# Patient Record
Sex: Female | Born: 1935 | Race: White | Hispanic: No | Smoking: Never smoker
Health system: Southern US, Community
[De-identification: ages and names within clinical notes are randomized; demographics above are authoritative.]

## PROBLEM LIST (undated history)

## (undated) DIAGNOSIS — F329 Major depressive disorder, single episode, unspecified: Secondary | ICD-10-CM

## (undated) DIAGNOSIS — E039 Hypothyroidism, unspecified: Secondary | ICD-10-CM

## (undated) DIAGNOSIS — F99 Mental disorder, not otherwise specified: Secondary | ICD-10-CM

## (undated) DIAGNOSIS — F32A Depression, unspecified: Secondary | ICD-10-CM

## (undated) DIAGNOSIS — F419 Anxiety disorder, unspecified: Secondary | ICD-10-CM

## (undated) DIAGNOSIS — T4145XA Adverse effect of unspecified anesthetic, initial encounter: Secondary | ICD-10-CM

## (undated) DIAGNOSIS — I639 Cerebral infarction, unspecified: Secondary | ICD-10-CM

## (undated) DIAGNOSIS — I10 Essential (primary) hypertension: Secondary | ICD-10-CM

## (undated) DIAGNOSIS — R011 Cardiac murmur, unspecified: Secondary | ICD-10-CM

## (undated) DIAGNOSIS — J069 Acute upper respiratory infection, unspecified: Secondary | ICD-10-CM

## (undated) DIAGNOSIS — T8859XA Other complications of anesthesia, initial encounter: Secondary | ICD-10-CM

## (undated) DIAGNOSIS — M1990 Unspecified osteoarthritis, unspecified site: Secondary | ICD-10-CM

## (undated) HISTORY — PX: OTHER SURGICAL HISTORY: SHX169

## (undated) HISTORY — PX: ABDOMINAL HYSTERECTOMY: SHX81

## (undated) HISTORY — PX: APPENDECTOMY: SHX54

## (undated) HISTORY — PX: BACK SURGERY: SHX140

---

## 1998-08-27 ENCOUNTER — Encounter: Payer: Self-pay | Admitting: Emergency Medicine

## 1998-08-27 ENCOUNTER — Emergency Department (HOSPITAL_COMMUNITY): Admission: EM | Admit: 1998-08-27 | Discharge: 1998-08-27 | Payer: Self-pay | Admitting: *Deleted

## 2000-12-18 ENCOUNTER — Encounter: Payer: Self-pay | Admitting: Interventional Cardiology

## 2000-12-18 ENCOUNTER — Ambulatory Visit (HOSPITAL_COMMUNITY): Admission: RE | Admit: 2000-12-18 | Discharge: 2000-12-18 | Payer: Self-pay | Admitting: Interventional Cardiology

## 2005-05-30 ENCOUNTER — Ambulatory Visit: Payer: Self-pay | Admitting: Internal Medicine

## 2007-06-19 ENCOUNTER — Ambulatory Visit (HOSPITAL_COMMUNITY): Admission: RE | Admit: 2007-06-19 | Discharge: 2007-06-19 | Payer: Self-pay | Admitting: Internal Medicine

## 2007-11-06 ENCOUNTER — Emergency Department (HOSPITAL_COMMUNITY): Admission: EM | Admit: 2007-11-06 | Discharge: 2007-11-06 | Payer: Self-pay | Admitting: Emergency Medicine

## 2008-04-16 ENCOUNTER — Ambulatory Visit (HOSPITAL_COMMUNITY): Admission: RE | Admit: 2008-04-16 | Discharge: 2008-04-16 | Payer: Self-pay | Admitting: Internal Medicine

## 2009-07-10 IMAGING — CR DG RIBS 2V*L*
4 series · 4 of 4 positions shown · non-contrast
Comparison: Yesterday?s chest x-ray.

CLINICAL DATA: Left chest wall pain 
 LEFT UNILATERAL RIBS:

[view not recorded (1 of 4)]
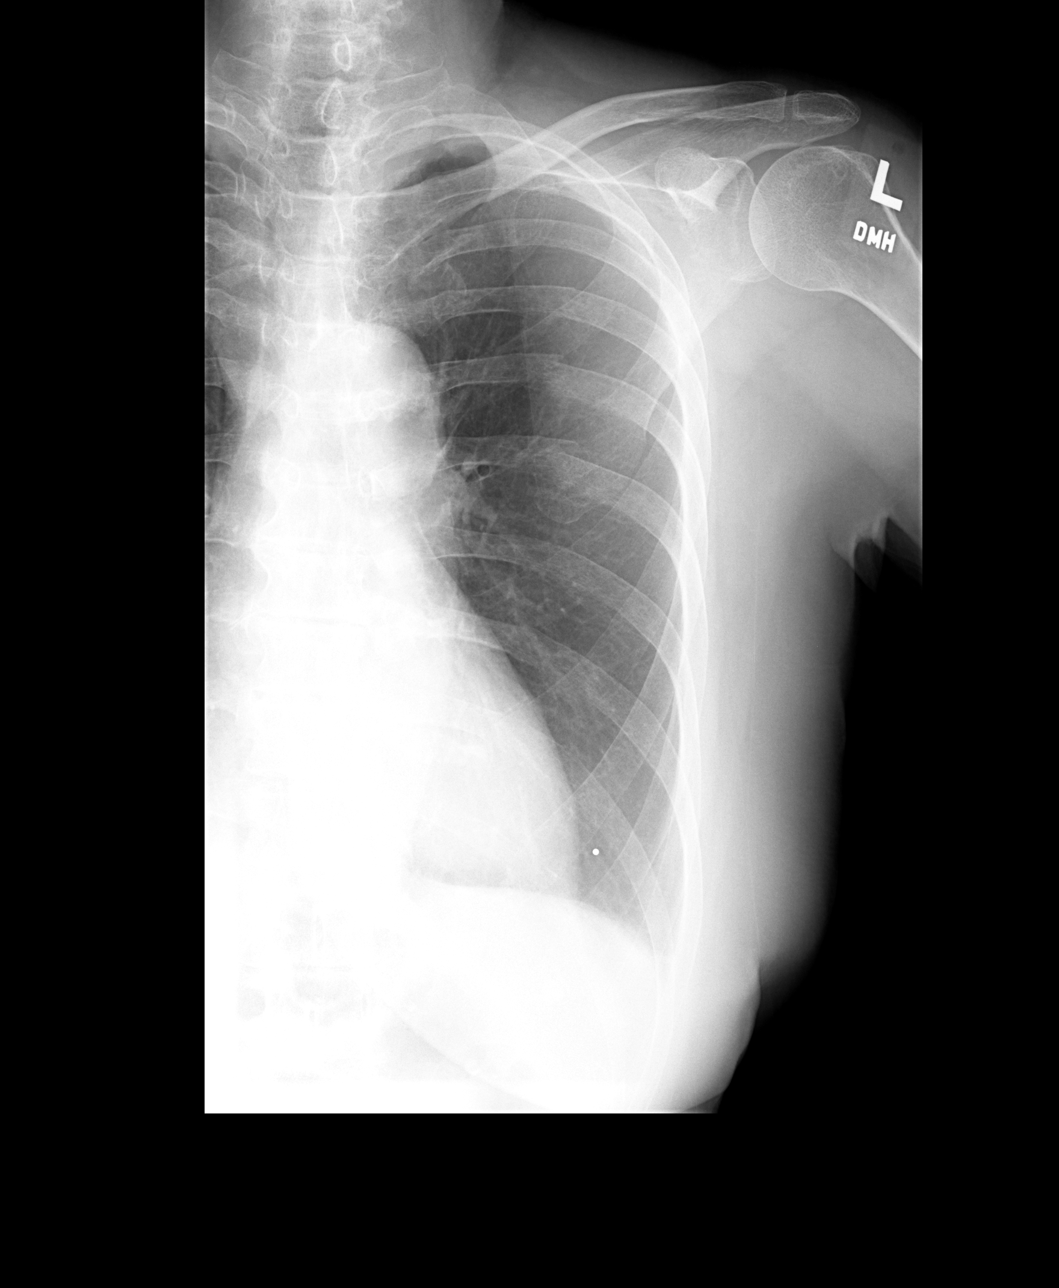

[view not recorded (2 of 4)]
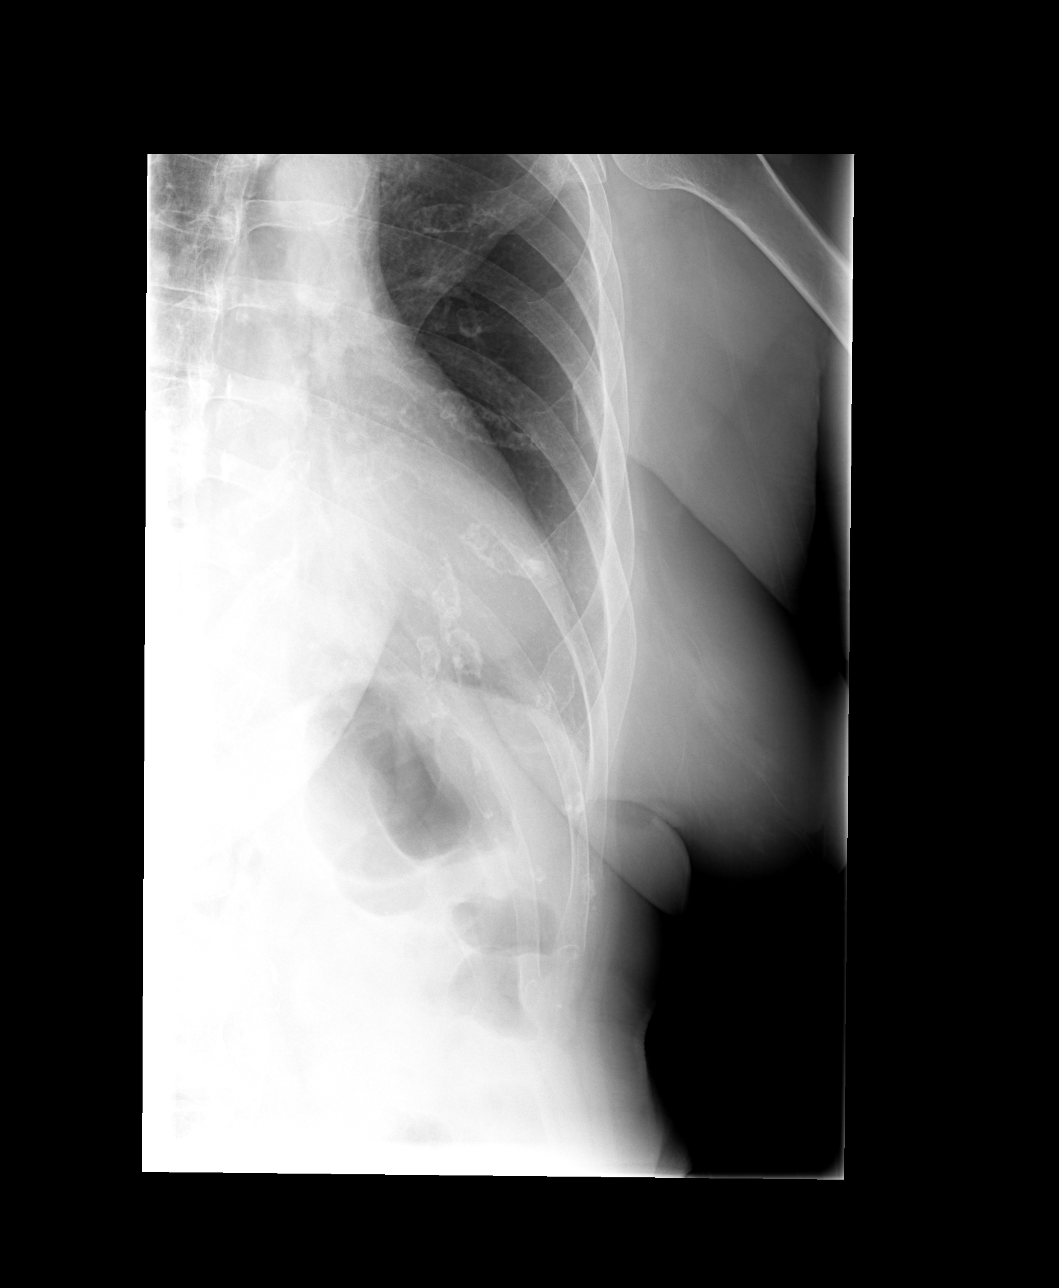

[view not recorded (3 of 4)]
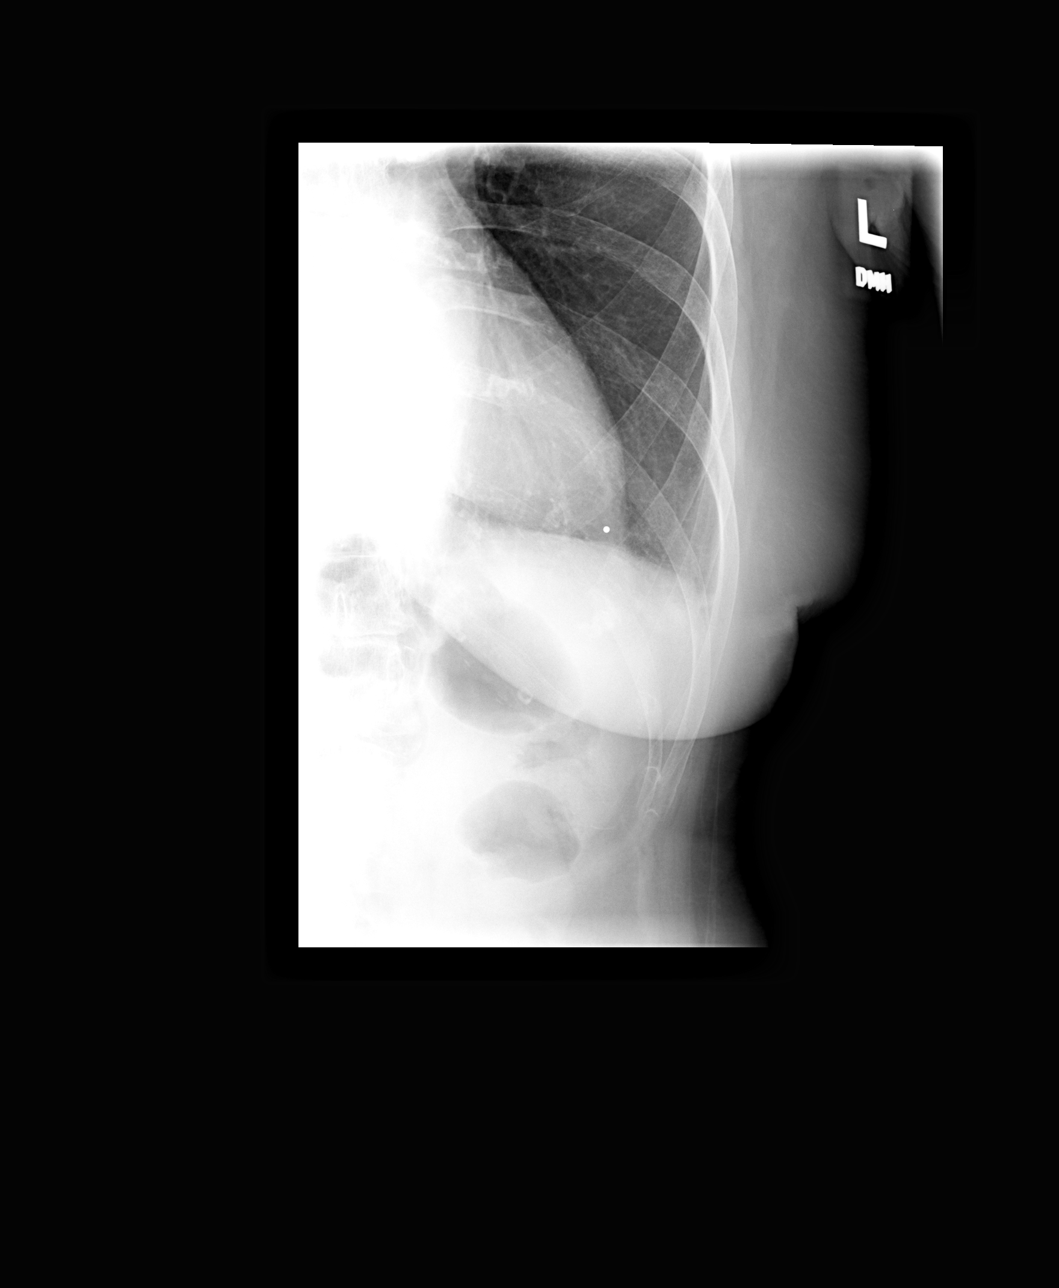

[view not recorded (4 of 4)]
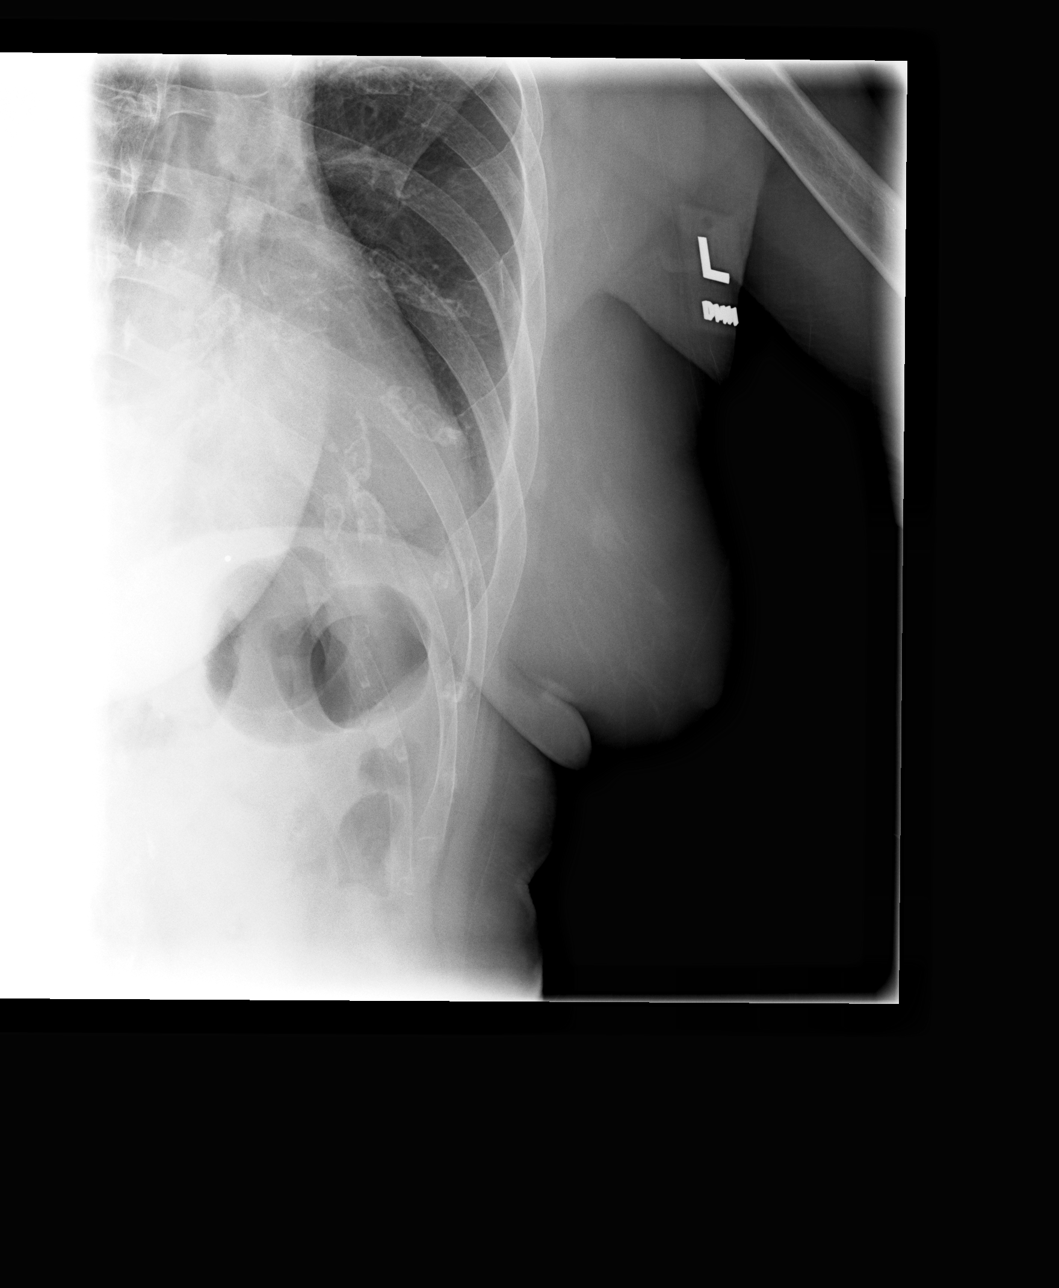

[4 of 4 positions shown; findings below may reference images not displayed]

FINDINGS: A BB was placed at the site of symptom along the posterior low left rib cage. 
 There are no fractures in this area, but there are fractures of the left 5th, 5th and 7th ribs that look either acute or subacute.  No pneumothorax or hemothorax.
IMPRESSION: 1.  No acute rib fractures in the area of symptoms, but there are acute or subacute fractures of the left 5th though7th ribs.  
 2.  No pneumothorax or hemothorax.

## 2011-07-19 LAB — DIFFERENTIAL
Basophils Absolute: 0.1
Eosinophils Absolute: 0.2
Eosinophils Relative: 2
Lymphocytes Relative: 19
Neutrophils Relative %: 67

## 2011-07-19 LAB — COMPREHENSIVE METABOLIC PANEL
ALT: 17
AST: 21
CO2: 27
Calcium: 9.3
Chloride: 96
Creatinine, Ser: 0.87
GFR calc Af Amer: >60
GFR calc non Af Amer: >60
Glucose, Bld: 104 — ABNORMAL HIGH
Total Bilirubin: 1

## 2011-07-19 LAB — CBC
HCT: 41.4
Hemoglobin: 14.3
MCHC: 34.6
MCV: 91.1
Platelets: 204
RBC: 4.55
RDW: 13.1
WBC: 10.1

## 2011-07-19 LAB — LIPASE, BLOOD: Lipase: 39

## 2011-10-11 ENCOUNTER — Inpatient Hospital Stay (HOSPITAL_COMMUNITY)
Admission: RE | Admit: 2011-10-11 | Discharge: 2011-10-15 | DRG: 066 | Disposition: A | Discharge status: Skilled Nursing Facility | Payer: Medicare Other | Source: Ambulatory Visit | Attending: Internal Medicine | Admitting: Internal Medicine

## 2011-10-11 ENCOUNTER — Encounter (HOSPITAL_COMMUNITY): Payer: Self-pay | Admitting: Internal Medicine

## 2011-10-11 ENCOUNTER — Other Ambulatory Visit: Payer: Self-pay | Admitting: Internal Medicine

## 2011-10-11 DIAGNOSIS — E039 Hypothyroidism, unspecified: Secondary | ICD-10-CM

## 2011-10-11 DIAGNOSIS — M1990 Unspecified osteoarthritis, unspecified site: Secondary | ICD-10-CM | POA: Insufficient documentation

## 2011-10-11 DIAGNOSIS — E785 Hyperlipidemia, unspecified: Secondary | ICD-10-CM

## 2011-10-11 DIAGNOSIS — F32A Depression, unspecified: Secondary | ICD-10-CM | POA: Insufficient documentation

## 2011-10-11 DIAGNOSIS — R2681 Unsteadiness on feet: Secondary | ICD-10-CM

## 2011-10-11 DIAGNOSIS — G309 Alzheimer's disease, unspecified: Secondary | ICD-10-CM | POA: Diagnosis present

## 2011-10-11 DIAGNOSIS — I639 Cerebral infarction, unspecified: Secondary | ICD-10-CM

## 2011-10-11 DIAGNOSIS — I6350 Cerebral infarction due to unspecified occlusion or stenosis of unspecified cerebral artery: Principal | ICD-10-CM | POA: Diagnosis present

## 2011-10-11 DIAGNOSIS — R002 Palpitations: Secondary | ICD-10-CM | POA: Diagnosis present

## 2011-10-11 DIAGNOSIS — F0280 Dementia in other diseases classified elsewhere without behavioral disturbance: Secondary | ICD-10-CM

## 2011-10-11 DIAGNOSIS — F329 Major depressive disorder, single episode, unspecified: Secondary | ICD-10-CM

## 2011-10-11 HISTORY — DX: Mental disorder, not otherwise specified: F99

## 2011-10-11 HISTORY — DX: Anxiety disorder, unspecified: F41.9

## 2011-10-11 HISTORY — DX: Hypothyroidism, unspecified: E03.9

## 2011-10-11 HISTORY — DX: Unspecified osteoarthritis, unspecified site: M19.90

## 2011-10-11 HISTORY — DX: Major depressive disorder, single episode, unspecified: F32.9

## 2011-10-11 HISTORY — DX: Adverse effect of unspecified anesthetic, initial encounter: T41.45XA

## 2011-10-11 HISTORY — DX: Acute upper respiratory infection, unspecified: J06.9

## 2011-10-11 HISTORY — DX: Cardiac murmur, unspecified: R01.1

## 2011-10-11 HISTORY — DX: Cerebral infarction, unspecified: I63.9

## 2011-10-11 HISTORY — DX: Depression, unspecified: F32.A

## 2011-10-11 HISTORY — DX: Other complications of anesthesia, initial encounter: T88.59XA

## 2011-10-11 HISTORY — DX: Essential (primary) hypertension: I10

## 2011-10-11 LAB — CBC
Hemoglobin: 14.2 g/dL (ref 12.0–15.0)
MCH: 31.4 pg (ref 26.0–34.0)
MCHC: 34.2 g/dL (ref 30.0–36.0)
MCV: 91.8 fL (ref 78.0–100.0)
RBC: 4.52 MIL/uL (ref 3.87–5.11)

## 2011-10-11 LAB — COMPREHENSIVE METABOLIC PANEL
CO2: 27 mEq/L (ref 19–32)
Calcium: 9.2 mg/dL (ref 8.4–10.5)
Creatinine, Ser: 1.12 mg/dL — ABNORMAL HIGH (ref 0.50–1.10)
GFR calc Af Amer: 53 mL/min — ABNORMAL LOW (ref >90)
GFR calc non Af Amer: 45 mL/min — ABNORMAL LOW (ref >90)
Glucose, Bld: 81 mg/dL (ref 70–99)
Total Protein: 7.2 g/dL (ref 6.0–8.3)

## 2011-10-11 LAB — LIPID PANEL
Cholesterol: 170 mg/dL (ref 0–200)
HDL: 43 mg/dL (ref >39)
Triglycerides: 118 mg/dL (ref <150)

## 2011-10-11 MED ORDER — METOPROLOL TARTRATE 25 MG PO TABS
25.0000 mg | ORAL_TABLET | Freq: Two times a day (BID) | ORAL | Status: DC
  Administered 2011-10-11 – 2011-10-15 (×5): 25 mg via ORAL
  Filled 2011-10-11 (×9): qty 1

## 2011-10-11 MED ORDER — MEMANTINE HCL 10 MG PO TABS: 10.0000 mg | ORAL_TABLET | Freq: Two times a day (BID) | ORAL | Status: DC

## 2011-10-11 MED ORDER — METOPROLOL TARTRATE 25 MG PO TABS: 25.0000 mg | ORAL_TABLET | Freq: Two times a day (BID) | ORAL | Status: DC

## 2011-10-11 MED ORDER — PRAVASTATIN SODIUM 20 MG PO TABS: 20.0000 mg | ORAL_TABLET | Freq: Every evening | ORAL | Status: DC

## 2011-10-11 MED ORDER — SODIUM CHLORIDE 0.9 % IV SOLN
INTRAVENOUS | Status: DC
  Administered 2011-10-11: 21:00:00 via INTRAVENOUS

## 2011-10-11 MED ORDER — ASPIRIN 325 MG PO TABS: 325.0000 mg | ORAL_TABLET | Freq: Every day | ORAL | Status: AC

## 2011-10-11 MED ORDER — DONEPEZIL HCL 10 MG PO TABS
10.0000 mg | ORAL_TABLET | Freq: Every day | ORAL | Status: DC
  Administered 2011-10-11 – 2011-10-14 (×4): 10 mg via ORAL
  Filled 2011-10-11 (×5): qty 1

## 2011-10-11 MED ORDER — LEVOTHYROXINE SODIUM 75 MCG PO TABS
75.0000 ug | ORAL_TABLET | Freq: Every day | ORAL | Status: DC
  Administered 2011-10-11 – 2011-10-15 (×5): 75 ug via ORAL
  Filled 2011-10-11 (×5): qty 1

## 2011-10-11 MED ORDER — CITALOPRAM HYDROBROMIDE 20 MG PO TABS
20.0000 mg | ORAL_TABLET | Freq: Every day | ORAL | Status: DC
  Administered 2011-10-11 – 2011-10-15 (×5): 20 mg via ORAL
  Filled 2011-10-11 (×5): qty 1

## 2011-10-11 MED ORDER — ENOXAPARIN SODIUM 40 MG/0.4ML ~~LOC~~ SOLN
40.0000 mg | SUBCUTANEOUS | Status: DC
  Administered 2011-10-11 – 2011-10-14 (×4): 40 mg via SUBCUTANEOUS
  Filled 2011-10-11 (×5): qty 0.4

## 2011-10-11 MED ORDER — LORAZEPAM 0.5 MG PO TABS: 0.5000 mg | ORAL_TABLET | Freq: Three times a day (TID) | ORAL | Status: DC | PRN

## 2011-10-11 MED ORDER — MEMANTINE HCL 10 MG PO TABS
10.0000 mg | ORAL_TABLET | Freq: Two times a day (BID) | ORAL | Status: DC
  Administered 2011-10-11 – 2011-10-15 (×8): 10 mg via ORAL
  Filled 2011-10-11 (×9): qty 1

## 2011-10-11 MED ORDER — ASPIRIN 325 MG PO TABS
325.0000 mg | ORAL_TABLET | Freq: Every day | ORAL | Status: DC
  Administered 2011-10-11 – 2011-10-15 (×5): 325 mg via ORAL
  Filled 2011-10-11 (×4): qty 1

## 2011-10-11 MED ORDER — ACETAMINOPHEN 325 MG PO TABS: 650.0000 mg | ORAL_TABLET | ORAL | Status: DC | PRN

## 2011-10-11 MED ORDER — LEVOTHYROXINE SODIUM 75 MCG PO TABS: 75.0000 ug | ORAL_TABLET | Freq: Every day | ORAL | Status: AC

## 2011-10-11 MED ORDER — DONEPEZIL HCL 10 MG PO TABS: 10.0000 mg | ORAL_TABLET | Freq: Every day | ORAL | Status: DC

## 2011-10-11 MED ORDER — CITALOPRAM HYDROBROMIDE 20 MG PO TABS: 20.0000 mg | ORAL_TABLET | Freq: Every day | ORAL | Status: DC

## 2011-10-11 NOTE — H&P (Addendum)
Heather Chan is an 77 y.o. female.   Chief Complaint: frequent falls HPI:  The patient is a 77 year old Caucasian woman with several medical problems including moderately advanced Alzheimer's disease, hypothyroidism, depression, and hyperlipidemia who presented to our office today for evaluation of worsening weakness and gait instability.  Yesterday she was found on her bathroom floor by a caretaker.  She did not have apparent injury.  She is not a good history because of her Alzheimer's disease and history is obtained via discussion with her daughter who is present during the exam.  For the past 2-3 days prior to hospitalization she's had progressive weakness with shaking episodes and unsteady gait.  During daytime hours she stays  at her daughter's house and then in the evening she returns to her home where a caretaker stays with her.  Yesterday her daughter found her in the bathroom without apparent injury.  She was somewhat lethargic and 911 was called.  For some reason they declined transportation to the emergency room and opted for an evaluation in our office today.  Her review of system is significant for a fever yesterday but none today with a dry cough.  She denied symptoms of change in vision, nausea, vomiting, headache, shortness of breath, or significant chest pain or palpitations.  She has no history of arrhythmia or cardiac disease or stroke. Following our office evaluation she had an acute MRI scan of the brain done [facility] with the studies showing a microinfarction of the left occipital lobe that was acute with global atrophy and small vessel ischemic disease.  There was no evidence of hemorrhage.  She is being admitted for further evaluation.  Past Medical History  Diagnosis Date  . Mental disorder   . DEMENTIA   . Hypothyroidism   . Arthritis   . Anxiety   . Depression     No current facility-administered medications on file as of 10/11/2011.   No current  outpatient prescriptions on file as of 10/11/2011.    ADDITIONAL HOME MEDICATIONS: lorazepam 0.5 mg by mouth daily when necessary anxiety, Aricept 10 mg by mouth every p.m., Namenda 10 mg by mouth twice daily, Celexa 20 mg take 2 tabs by mouth once daily, Synthroid 75 Chan by mouth daily, pravastatin 20 mg by mouth daily  PHYSICIANS INVOLVED IN CARE: Heather Chan (primary care)  Past Surgical History  Procedure Date  . Appendectomy   . Abdominal hysterectomy   . Back surgery     History reviewed. No pertinent family history.   Social History:  reports that she has never smoked. She does not have any smokeless tobacco history on file. She reports that she does not drink alcohol or use illicit drugs.  Allergies:  Allergies  Allergen Reactions  . Pneumovax (Pneumococcal Polysaccharides)   . Penicillins    Note that last influenza vaccination was July 19, 2011 and she is unable to take the Pneumovax vaccination because of an allergy  ROS: arthritis, thyroid disease and anxiety, atypical chest discomfort, occasional palpitations, depression, short-term memory deficits, osteoarthritis  PHYSICAL EXAM: There were no vitals taken for this visit. Office vital signs include blood pressure 160/80, pulse 72, respirations 16, temperature 98.6, body weight 174 pounds at a height of 63.75 inches.  Pulse oxygen saturation was 96% on room air.  In general, she's a pro-elderly white woman who was in no apparent distress while sitting in her car seat (she declined office reevaluation because of fatigue).  She was a poor  historian because of Alzheimer's disease.  HEENT exam was within normal limits, neck was supple without jugular venous distention or carotid bruit, chest was clear to auscultation, heart had a rapid irregular rhythm without significant murmur, abdomen had normal bowel sounds and no tenderness, extremities have bilateral trace ankle edema, neurologic exam: She is alert and oriented  x1, cranial nerves II through XII were normal with the exception of decreased hearing bilaterally, sensory exam was grossly normal, motor strength is 5 out of 5 throughout, she had bilateral slow finger to nose testing, her gait was wide-based during earlier evaluation with our nurse practitioner.  No results found for this or any previous visit (from the past 48 hour(s)). No results found.  Today MRI scan of the brain and tried imaging showed the following: No acute hemorrhagic findings, acute microinfarction of the left occipital lobe with global atrophy and small vessel ischemic changes.  Today chest x-ray done and tried imaging showed the following: Mild left lower lobe atelectatic change, calcific atherosclerotic vascular changes, hiatal hernia, multiple old left rib fractures   Assessment/Plan #1 Acute Stroke: On MRI imaging she has an acute stroke and by physical exam and history she has new gait instability.  We will request a physical therapist and occupational therapist evaluation.  We will also admit her to a telemetry bed to see if she is having continuous or intermittent atrial fibrillation that certainly could cause an acute stroke.  For now we have placed her on aspirin once daily.  We will also arrange for her to have a carotid ultrasound exam and an echocardiogram.  Earlier today she ate without difficulty a regular diet and I doubt that she has significant swallowing problems. #2 Palpitations: Clinically appears that she may have atrial fibrillation and we will evaluate this with a 12-lead EKG and telemetry. #3 Alzheimer's disease: Moderately advanced and stable on current medications. #4 Depression and Anxiety: Stable on current medications. #5 Hypothyroidism: Clinically she is euthyroid and we will check a TSH level. #6 Hyperlipidemia: She had been on pravastatin which is not covered by hospital formulary and we will make an appropriate substitution.  Heather Rauh  Chan 10/11/2011, 6:29 PM

## 2011-10-12 ENCOUNTER — Other Ambulatory Visit: Payer: Self-pay

## 2011-10-12 ENCOUNTER — Inpatient Hospital Stay (HOSPITAL_COMMUNITY): Payer: Medicare Other

## 2011-10-12 DIAGNOSIS — I359 Nonrheumatic aortic valve disorder, unspecified: Secondary | ICD-10-CM

## 2011-10-12 LAB — LIPID PANEL
Cholesterol: 162 mg/dL (ref 0–200)
HDL: 38 mg/dL — ABNORMAL LOW (ref >39)
Total CHOL/HDL Ratio: 4.3 RATIO
Triglycerides: 100 mg/dL (ref <150)
VLDL: 20 mg/dL (ref 0–40)

## 2011-10-12 LAB — GLUCOSE, CAPILLARY
Glucose-Capillary: 107 mg/dL — ABNORMAL HIGH (ref 70–99)
Glucose-Capillary: 124 mg/dL — ABNORMAL HIGH (ref 70–99)
Glucose-Capillary: 101 mg/dL — ABNORMAL HIGH (ref 70–99)

## 2011-10-12 LAB — TSH: TSH: 3.076 u[IU]/mL (ref 0.350–4.500)

## 2011-10-12 NOTE — Plan of Care (Signed)
Problem: Phase II Progression Outcomes Goal: Tolerating diet Completed Bedside Swallow Evaluation Scheduled MBS for today at 1300 Pt to remain NPO until completion of MBS

## 2011-10-12 NOTE — Procedures (Signed)
Modified Barium Swallow Procedure Note Patient Details  Name: Carnella Fryman MRN: [id number] Date of Birth: Jan 03, 1930  Today's Date: 10/12/2011 Time:  -     Past Medical History:  Past Medical History  Diagnosis Date  . Mental disorder   . DEMENTIA   . Hypothyroidism   . Arthritis   . Anxiety   . Depression   . Complication of anesthesia     hypotension post surgery  . Heart murmur   . Hypertension   . Stroke   . Recurrent upper respiratory infection (URI)    Past Surgical History:  Past Surgical History  Procedure Date  . Appendectomy   . Abdominal hysterectomy   . Back surgery   . Thyroidectomy     Recommendation/Prognosis  Clinical Impression Dysphagia Diagnosis: Suspected primary esophageal dysphagia Clinical impression: Oral and pharyngeal phase of swallow was WFL's.  Esophageal scan revealed retrograde movement of purees toward upper esophagus as well as slow transit and hesitancy at lower esophagus.  Pt. coughed once throughout study most likely from esophageal residue.  Aspiration risk is low with regular diet and thin liquids.  Would pt. benefit from a PPI?  Will further discuss esophageal precautions with pt.   Recommendations Solid Consistency: Regular Liquid Consistency: Thin Liquid Administration via: Cup;Straw Medication Administration: Whole meds with liquid Supervision: Patient able to self feed Compensations: Slow rate;Small sips/bites;Follow solids with liquid Postural Changes and/or Swallow Maneuvers: Seated upright 90 degrees (STAY SITTING UPRIGHT 45 MIN-1 HOUR AFTER MEALS) Oral Care Recommendations: Oral care QID Prognosis Prognosis for Safe Diet Advancement: Good Barriers to Reach Goals: Cognitive deficits Individuals Consulted Consulted and Agree with Results and Recommendations: Patient;Family member/caregiver Family Member Consulted: DAUGHTER  SLP Assessment/Plan   SLP Goals   1. Pt. Will consume regular texture with thin liquids  utilizing esophageal precautions with max verbal cues.  General:  Other Pertinent Information: Pt. admitted with seft CVA.  Bedside swallow recommended MBS to further assess oropharygneal swallow function..  PMH; dementia Type of Study: Initial MBS Diet Prior to this Study: Regular;Thin liquids Respiratory Status: Room air Behavior/Cognition: Alert;Confused Oral Cavity - Dentition: Adequate natural dentition Vision: Functional for self-feeding Patient Positioning: Upright in chair Baseline Vocal Quality: Normal Volitional Cough: Strong;Congested Volitional Swallow: Able to elicit Anatomy: Within functional limits Pharyngeal Secretions: Not observed secondary MBS Ice chips: Not tested  Oral Phase Oral Preparation/Oral Phase Oral Phase: WFL Pharyngeal Phase  Pharyngeal Phase Pharyngeal Phase: Within functional limits Cervical Esophageal Phase  Cervical Esophageal Phase Cervical Esophageal Phase: Impaired Cervical Esophageal Phase - Pudding Pudding Teaspoon: Esophageal backflow into cervical esophagus Cervical Esophageal Phase - Thin Thin Cup: Esophageal backflow into cervical esophagus Cervical Esophageal Phase - Comment Cervical Esophageal Comment: Retrograde movement of puree toward cervical esophagus.  Hesitancy in lower esophagus, thin liquids assist in transit to stomach   Royce Macadamia M.Ed [employer] [phone]  10/12/2011

## 2011-10-12 NOTE — Progress Notes (Signed)
Received or for [facility], await PT and OT eval for recommendations, spoke with pt who gave permission to speak with daughter, call placed to daughter re d/c planning and number left for daughter.  Will continue to follow and assist with d/c planning.  Johny Shock RN MPH Case Manager [phone]

## 2011-10-12 NOTE — Progress Notes (Signed)
Subjective: Patient is wide awake but obviously confused. She recognizes me but does not know my name. She is conversant and can follow commands. She certainly close to her baseline confusion but obviously bit worse in the hospital particularly given circumstances.  Objective: Vital signs in last 24 hours: Temp:  [98.6 F (37 C)-99.6 F (37.6 C)] 99.6 F (37.6 C) (12/14 0545) Pulse Rate:  [68-78] 68  (12/14 0545) Resp:  [19-20] 20  (12/14 0545) BP: (134-154)/(68-94) 154/76 mmHg (12/14 0545) SpO2:  [92 %-96 %] 95 % (12/14 0545) Weight:  [78.835 kg (173 lb 12.8 oz)] 173 lb 12.8 oz (78.835 kg) (12/13 2000) Weight change:   CBG (last 3)   Basename 10/12/11 0651 10/11/11 2158  GLUCAP 124* 107*    Intake/Output from previous day:    Physical Exam: Patient is awake and alert conversing speech is fluent. She is good facial symmetry. Lungs are clear to auscultation percussion. Cardiovascular exam is regular rate and rhythm normal S1-S2 no obvious murmur. Telemetry reveals normal sinus rhythm. Neck is supple no JVD or bruits. Abdomen is soft and nontender normal bowel sounds no hepatosplenomegaly. Extremities intact pulses trace edema calves soft and nontender joints appear normal. Neurologically she is fluent in speech confused regarding circumstances but at baseline mental status. Cranial nerves are intact. Motor is 5 out of 5. Sensation is grossly intact. It was not assessed.   Lab Results:  [facility] 10/11/11 1957  NA 134*  K 3.7  CL 96  CO2 27  GLUCOSE 81  BUN 16  CREATININE 1.12*  CALCIUM 9.2  MG --  PHOS --    Basename 10/11/11 1957  AST 17  ALT 13  ALKPHOS 77  BILITOT 0.8  PROT 7.2  ALBUMIN 3.3*    Basename 10/11/11 1957  WBC 13.4*  NEUTROABS --  HGB 14.2  HCT 41.5  MCV 91.8  PLT 193   No results found for this basename: INR, PROTIME   No results found for this basename: CKTOTAL:3,CKMB:3,CKMBINDEX:3,TROPONINI:3 in the last 72 hours  Basename 10/11/11 1957   TSH 3.076  T4TOTAL --  T3FREE --  THYROIDAB --   No results found for this basename: VITAMINB12:2,FOLATE:2,FERRITIN:2,TIBC:2,IRON:2,RETICCTPCT:2 in the last 72 hours  Studies/Results: No results found.   Assessment/Plan: #1 acute left occipital lobe infarct small with no obvious deficit at this point beyond his stork fall related above  #2 Alzheimer's dementia moderate to severe on multiple medications stable  #3 chronic depression and anxiety stable  #4 hypothyroidism stable  #5 hyperlipidemia stable  Plan the biggest issue here may indeed want that being disposition. Her medications have been adjusted. Carotid Dopplers are pending. Will S. care management to get involved with family.   LOS: 1 day   Heather Chan A 10/12/2011, 8:24 AM

## 2011-10-12 NOTE — Progress Notes (Signed)
PRELIMINARY  PRELIMINARY  PRELIMINARY  PRELIMINARY  Carotid Duplex completed.    Preliminary report:  Bilateral:  No evidence of hemodynamically significant internal carotid artery stenosis.  Vertebral artery flow is antegrade.    Heather Chan 10/12/2011 11:40 AM

## 2011-10-12 NOTE — Plan of Care (Signed)
Problem: Phase II Progression Outcomes Goal: Tolerating diet MBS completed.  Recommend regular diet texture, thin liquids with esophageal precautions.  SEE NOTE BELOW FOR FULL REPORT  Breck Coons Glide.Ed [employer] [phone]  10/12/2011

## 2011-10-13 ENCOUNTER — Inpatient Hospital Stay (HOSPITAL_COMMUNITY): Payer: Medicare Other

## 2011-10-13 LAB — URINALYSIS, ROUTINE W REFLEX MICROSCOPIC
Leukocytes, UA: NEGATIVE
Protein, ur: NEGATIVE mg/dL
Urobilinogen, UA: 4 mg/dL — ABNORMAL HIGH (ref 0.0–1.0)

## 2011-10-13 LAB — BASIC METABOLIC PANEL
CO2: 27 mEq/L (ref 19–32)
Glucose, Bld: 118 mg/dL — ABNORMAL HIGH (ref 70–99)
Potassium: 3.8 mEq/L (ref 3.5–5.1)
Sodium: 138 mEq/L (ref 135–145)

## 2011-10-13 LAB — RAPID URINE DRUG SCREEN, HOSP PERFORMED
Barbiturates: NOT DETECTED
Benzodiazepines: NOT DETECTED

## 2011-10-13 LAB — GLUCOSE, CAPILLARY
Glucose-Capillary: 114 mg/dL — ABNORMAL HIGH (ref 70–99)
Glucose-Capillary: 104 mg/dL — ABNORMAL HIGH (ref 70–99)

## 2011-10-13 LAB — CBC
Hemoglobin: 12.7 g/dL (ref 12.0–15.0)
RBC: 4.16 MIL/uL (ref 3.87–5.11)

## 2011-10-13 MED ORDER — MOXIFLOXACIN HCL IN NACL 400 MG/250ML IV SOLN
400.0000 mg | INTRAVENOUS | Status: DC
  Administered 2011-10-13 – 2011-10-14 (×2): 400 mg via INTRAVENOUS
  Filled 2011-10-13 (×5): qty 250

## 2011-10-13 NOTE — Progress Notes (Signed)
Physical Therapy Evaluation Patient Details Name: Heather Chan MRN: [id number] DOB: 02/14/1930 Today's Date: 10/13/2011  Problem List:  Patient Active Problem List  Diagnoses  . Stroke  . Gait instability  . Hypothyroidism  . Depression  . Alzheimer disease  . Hyperlipemia  . Osteoarthritis  . Palpitations    Past Medical History:  Past Medical History  Diagnosis Date  . Mental disorder   . DEMENTIA   . Hypothyroidism   . Arthritis   . Anxiety   . Depression   . Complication of anesthesia     hypotension post surgery  . Heart murmur   . Hypertension   . Stroke   . Recurrent upper respiratory infection (URI)    Past Surgical History:  Past Surgical History  Procedure Date  . Appendectomy   . Abdominal hysterectomy   . Back surgery   . Thyroidectomy     PT Assessment/Plan/Recommendation PT Assessment Clinical Impression Statement: Pt is s/p Lt occipital lobe CVA (denies vision changes) with decline in balance and ambulation. Pt will benefit from PT to maximize safety and independence with mobility to prepare for ultimate d/c home with 24 hr assist. PT Recommendation/Assessment: Patient will need skilled PT in the acute care venue PT Problem List: Decreased strength;Decreased balance;Decreased mobility;Decreased cognition;Decreased knowledge of use of DME;Decreased safety awareness Barriers to Discharge: Decreased caregiver support Barriers to Discharge Comments: daughter plans to hire aide to stay with pt evenings and overnight PT Therapy Diagnosis : Difficulty walking PT Plan PT Frequency: Min 3X/week PT Treatment/Interventions: DME instruction;Gait training;Functional mobility training;Therapeutic activities;Balance training;Patient/family education PT Recommendation Follow Up Recommendations: Skilled nursing facility Equipment Recommended: Defer to next venue PT Goals  Acute Rehab PT Goals PT Goal Formulation: With patient/family Time For Goal Achievement:  2 weeks Pt will Roll Supine to Right Side: with modified independence PT Goal: Rolling Supine to Right Side - Progress: Other (comment) Pt will Roll Supine to Left Side: with modified independence PT Goal: Rolling Supine to Left Side - Progress: Other (comment) Pt will go Supine/Side to Sit: with modified independence PT Goal: Supine/Side to Sit - Progress: Other (comment) Pt will go Sit to Supine/Side: with modified independence PT Goal: Sit to Supine/Side - Progress: Other (comment) Pt will go Sit to Stand: with supervision PT Goal: Sit to Stand - Progress: Other (comment) Pt will go Stand to Sit: with supervision PT Goal: Stand to Sit - Progress: Other (comment) Pt will Ambulate: 51 - 150 feet;with supervision;with least restrictive assistive device PT Goal: Ambulate - Progress: Other (comment) Additional Goals Additional Goal #1: Pt will perform dynamic standing balance activities with min-guard assist with no overt LOB for 2 minutes. PT Goal: Additional Goal #1 - Progress: Other (comment)  PT Evaluation Precautions/Restrictions  Precautions Precautions: Fall Precaution Comments: Daughter reports declining balance/more tremulous x 3 days PTA; no h/o falls prior to fall resulting in admission Prior Functioning  Home Living Lives With: Alone Receives Help From: Family;Personal care attendant (stays c daughter during day;aide 4-8 p.m. and alone at night) Type of Home:  (not assessed due to anticipated need for short-term SNF) Home Adaptive Equipment: None Additional Comments: Discussed d/c plan with daughter and pt; daughter agreeable and pt reluctant;  Prior Function Level of Independence: Independent with gait (has refused use of DME) Vocation: Retired Financial risk analyst Arousal/Alertness: Awake/alert Overall Cognitive Status: History of cognitive impairments History of Cognitive Impairment: Appears at baseline functioning (per daughter) Cognition - Other Comments: no  short-term memory per daughter Sensation/Coordination Sensation Light  Touch: Appears Intact (bil legs-denies changes) Coordination Gross Motor Movements are Fluid and Coordinated: No Coordination and Movement Description: tremulous/shakey with gross movements Extremity Assessment RLE Assessment RLE Assessment: Within Functional Limits LLE Assessment LLE Assessment: Exceptions to [facility] LLE Strength LLE Overall Strength: Deficits LLE Overall Strength Comments: knee extension 4/5 Mobility (including Balance) Bed Mobility Bed Mobility: Yes Rolling Left: 4: Min assist;With rail Rolling Left Details (indicate cue type and reason): hand-over-hand guidance to reach for rail Left Sidelying to Sit: 5: Supervision;HOB flat;With rails Left Sidelying to Sit Details (indicate cue type and reason): supervision for safety Sitting - Scoot to Edge of Bed: 5: Supervision Sitting - Scoot to Edge of Bed Details (indicate cue type and reason): for safety Transfers Transfers: Yes Sit to Stand: 4: Min assist;With upper extremity assist;From bed;From toilet Sit to Stand Details (indicate cue type and reason): min-guard assist for safety; pt slightly dizzy and unsteady Stand to Sit: 4: Min assist;With upper extremity assist;To chair/3-in-1;With armrests;To toilet Stand to Sit Details: min-guard assist for safety; pt able to control descent to low toilet  Ambulation/Gait Ambulation/Gait: Yes Ambulation/Gait Assistance: 4: Min assist Ambulation/Gait Assistance Details (indicate cue type and reason): Rt HHA; wide BOS Ambulation Distance (Feet): 10 Feet (after toileting, ambulated 20 ft) Assistive device: 1 person hand held assist Gait Pattern: Decreased step length - right;Decreased step length - left;Decreased weight shift to right;Decreased weight shift to left;Shuffle  Posture/Postural Control Posture/Postural Control: No significant limitations Balance Balance Assessed: Yes Static Sitting  Balance Static Sitting - Balance Support: No upper extremity supported;Feet unsupported Static Sitting - Level of Assistance: 5: Stand by assistance Static Sitting - Comment/# of Minutes: supervision for safety; EOB x 4 minutes Static Standing Balance Static Standing - Balance Support: Left upper extremity supported Static Standing - Level of Assistance: 4: Min assist Static Standing - Comment/# of Minutes: min-guard assist [place] - Eyes Opened: 20  (minguard assist) Exercise    End of Session PT - End of Session Equipment Utilized During Treatment: Gait belt Activity Tolerance: Patient limited by fatigue Patient left: in chair;with call bell in reach;with family/visitor present General Behavior During Session: [facility] for tasks performed  Jenessa Gillingham 10/13/2011, 3:58 PM

## 2011-10-13 NOTE — Progress Notes (Addendum)
Subjective: No complaints, no shortness of breath, no chest pain, denies cough, denies pain, denies headache, denies nausea and vomiting but complicated by dementia and lack of short-term memory. No notes from staff with regards to concerns. Started on regular diet after speech therapy consultation yesterday.  Objective: Vital signs in last 24 hours: Temp:  [98.6 F (37 C)-100.2 F (37.9 C)] 100 F (37.8 C) (12/15 0600) Pulse Rate:  [64-68] 67  (12/15 0600) Resp:  [18-20] 20  (12/15 0600) BP: (143-170)/(57-80) 143/60 mmHg (12/15 0600) SpO2:  [91 %-98 %] 98 % (12/15 0600) Weight change:  Last BM Date: 10/11/11  CBG (last 3)   Basename 10/13/11 0635 10/12/11 1750 10/12/11 1149  GLUCAP 114* 126* 101*    Intake/Output from previous day:   Intake/Output this shift:   Physical exam, no apparent distress, sclera anicteric, extraocular movements appear intact, no oropharyngeal lesions, neck supple, no cervical lymphadenopathy, lungs reveal coarse rhonchi bilaterally with no respiratory distress, cardiographs exam is regular rate and rhythm with no obvious murmur, abdomen soft, nontender, nondistended, no suprapubic tenderness, extremity reveals no edema, pedal pulses are intact but diminished, calves are soft, patient can move all 4 extremities, patient is alert but orientation to time is not intact, cranial nerves II through XII are grossly intact, motor is 5 out of 5 in all 4 extremities, light touch is intact in the 4 distal extremities.  Lab Results:  [facility] 10/13/11 0600 10/11/11 1957  NA 138 134*  K 3.8 3.7  CL 103 96  CO2 27 27  GLUCOSE 118* 81  BUN 9 16  CREATININE 0.85 1.12*  CALCIUM 8.4 9.2  MG -- --  PHOS -- --    Basename 10/11/11 1957  AST 17  ALT 13  ALKPHOS 77  BILITOT 0.8  PROT 7.2  ALBUMIN 3.3*    Basename 10/13/11 0600 10/11/11 1957  WBC 10.3 13.4*  NEUTROABS -- --  HGB 12.7 14.2  HCT 38.4 41.5  MCV 92.3 91.8  PLT 186 193   No results found for  this basename: CKTOTAL:3,CKMB:3,CKMBINDEX:3,TROPONINI:3 in the last 72 hours  Basename 10/11/11 1957  TSH 3.076  T4TOTAL --  T3FREE --  THYROIDAB --   No results found for this basename: VITAMINB12:2,FOLATE:2,FERRITIN:2,TIBC:2,IRON:2,RETICCTPCT:2 in the last 72 hours  Studies/Results: Dg Swallowing Func-no Report  10/12/2011  CLINICAL DATA: dysphagia   FLUOROSCOPY FOR SWALLOWING FUNCTION STUDY:  Fluoroscopy was provided for swallowing function study, which was  administered by a speech pathologist.  Final results and recommendations  from this study are contained within the speech pathology report.       Medications: Scheduled:   . aspirin  325 mg Oral Daily  . citalopram  20 mg Oral Daily  . donepezil  10 mg Oral QHS  . enoxaparin  40 mg Subcutaneous Q24H  . levothyroxine  75 mcg Oral Daily  . memantine  10 mg Oral BID  . metoprolol tartrate  25 mg Oral BID   Continuous:   . sodium chloride 50 mL/hr at 10/11/11 2047    Assessment/Plan: Principal Problem:  *Stroke-left occipital lobe, no obvious deficits or defects at this time, physical and occupational therapy ongoing and ordered, carotid Dopplers initially appear without any significant disease. Speech therapy to has cleared the patient for regular diet Active Problems:  Gait instability speech therapy and occupational therapy ongoing with probable need for skilled nursing facility  Hypothyroidism appears clinically euthyroid with TSH normal Mood disorder-stable Fever of 100.25F with rhonchi on physical exam, slightly  increased white blood cell count, will check chest x-ray, and urinalysis, and will start patient empirically on Rocephin    LOS: 2 days   Finas Delone R 10/13/2011, 10:11 AM Patient with penicillin allergy, will initiate Avelox after urinalysis .

## 2011-10-14 LAB — GLUCOSE, CAPILLARY
Glucose-Capillary: 127 mg/dL — ABNORMAL HIGH (ref 70–99)
Glucose-Capillary: 101 mg/dL — ABNORMAL HIGH (ref 70–99)

## 2011-10-14 LAB — CBC
HCT: 34.7 % — ABNORMAL LOW (ref 36.0–46.0)
MCHC: 33.7 g/dL (ref 30.0–36.0)
RDW: 13 % (ref 11.5–15.5)

## 2011-10-14 MED ORDER — GUAIFENESIN ER 600 MG PO TB12
600.0000 mg | ORAL_TABLET | Freq: Two times a day (BID) | ORAL | Status: DC | PRN
  Administered 2011-10-14 (×2): 600 mg via ORAL
  Filled 2011-10-14 (×3): qty 1

## 2011-10-14 MED ORDER — LEVALBUTEROL HCL 0.63 MG/3ML IN NEBU
0.6300 mg | INHALATION_SOLUTION | Freq: Four times a day (QID) | RESPIRATORY_TRACT | Status: DC | PRN
  Administered 2011-10-14 (×2): 0.63 mg via RESPIRATORY_TRACT
  Filled 2011-10-14 (×2): qty 3

## 2011-10-14 NOTE — Progress Notes (Signed)
Pt is having sporadic episodes of violent cough, with bilateral wheezing. MD notified and orders obtained for breathing treatment and Mucinex. Will continue to monitor closely.

## 2011-10-14 NOTE — Progress Notes (Signed)
CSW received consult for SNF. Spoke with pt's dtr who is agreeable to SNF and prefers [place] or [place]. See chart for full CSW eval and FL2. Will f/u with offers.  Dellie Burns, MSW, [facility] [phone] (weekend cover)

## 2011-10-14 NOTE — Progress Notes (Signed)
Subjective: Significant issues with violent cough overnight with bilateral wheezing, nebulizer treatments and Mucinex ordered. Chest x-ray unremarkable. Currently patient with significant short-term memory deficits, states that her night went well, is currently not wheezing, apparently nebulizer treatments have helped, no cough, no apparent distress  Objective: Vital signs in last 24 hours: Temp:  [98.1 F (36.7 C)-99.8 F (37.7 C)] 98.7 F (37.1 C) (12/16 0604) Pulse Rate:  [54-63] 57  (12/16 0946) Resp:  [18-20] 18  (12/16 0604) BP: (90-142)/(44-63) 142/61 mmHg (12/16 0604) SpO2:  [92 %-99 %] 92 % (12/16 0604) Weight change:  Last BM Date: 10/13/11  CBG (last 3)   Basename 10/14/11 0633 10/13/11 2249 10/13/11 1659  GLUCAP 101* 101* 127*    Intake/Output from previous day: 12/15 0701 - 12/16 0700 In: 480 [P.O.:480] Out: -  Intake/Output this shift: Total I/O In: 360 [P.O.:360] Out: -    Physical exam, no apparent distress, sclera anicteric, extraocular movements appear intact, no oropharyngeal lesions, neck supple, no cervical lymphadenopathy, lungs reveal breath sounds with no rhonchi or wheezing bilaterally with no respiratory distress, cardiographs exam is regular rate and rhythm with no obvious murmur, abdomen soft, nontender, nondistended, no suprapubic tenderness, extremity reveals no edema, pedal pulses are intact but diminished, calves are soft, patient can move all 4 extremities, patient is alert but orientation to time is not intact, cranial nerves II through XII are grossly intact, motor is 5 out of 5 in all 4 extremities, light touch is intact in the 4 distal extremities.      Lab Results:  [facility] 10/13/11 0600 10/11/11 1957  NA 138 134*  K 3.8 3.7  CL 103 96  CO2 27 27  GLUCOSE 118* 81  BUN 9 16  CREATININE 0.85 1.12*  CALCIUM 8.4 9.2  MG -- --  PHOS -- --    Basename 10/11/11 1957  AST 17  ALT 13  ALKPHOS 77  BILITOT 0.8  PROT 7.2  ALBUMIN  3.3*    Basename 10/14/11 0613 10/13/11 0600  WBC 10.5 10.3  NEUTROABS -- --  HGB 11.7* 12.7  HCT 34.7* 38.4  MCV 92.0 92.3  PLT 176 186   No results found for this basename: CKTOTAL:3,CKMB:3,CKMBINDEX:3,TROPONINI:3 in the last 72 hours  Basename 10/11/11 1957  TSH 3.076  T4TOTAL --  T3FREE --  THYROIDAB --   No results found for this basename: VITAMINB12:2,FOLATE:2,FERRITIN:2,TIBC:2,IRON:2,RETICCTPCT:2 in the last 72 hours  Studies/Results: Dg Chest 2 View  10/13/2011  *RADIOLOGY REPORT*  Clinical Data: Fever.  Coarse rhonchi.  CHEST - 2 VIEW  Comparison: 11/06/2007  Findings: The heart is enlarged.  There is elevation of the right hemidiaphragm.  No evidence for pulmonary edema.  There are no focal consolidations or definite pleural effusions.  There are old left-sided rib fractures.  The lateral and posterior costophrenic angles are excluded on both views.  Small effusions could be excluded.  IMPRESSION:  1.  Cardiomegaly. 2. No focal pulmonary abnormality.  Original Report Authenticated By: Patterson Hammersmith, M.D.   Dg Swallowing Func-no Report  10/12/2011  CLINICAL DATA: dysphagia   FLUOROSCOPY FOR SWALLOWING FUNCTION STUDY:  Fluoroscopy was provided for swallowing function study, which was  administered by a speech pathologist.  Final results and recommendations  from this study are contained within the speech pathology report.       Medications: Scheduled:   . aspirin  325 mg Oral Daily  . citalopram  20 mg Oral Daily  . donepezil  10 mg Oral QHS  .  enoxaparin  40 mg Subcutaneous Q24H  . levothyroxine  75 mcg Oral Daily  . memantine  10 mg Oral BID  . metoprolol tartrate  25 mg Oral BID  . moxifloxacin  400 mg Intravenous Q24H   Continuous:   . sodium chloride 50 mL/hr at 10/11/11 2047    Assessment/Plan: Principal Problem:  *Stroke-left occipital lobe, no obvious deficits or defects at this time, physical and occupational therapy ongoing and ordered, carotid  Dopplers initially appear without any significant disease. Speech therapy to has cleared the patient for regular diet  Active Problems:  Gait instability speech therapy and occupational therapy ongoing with probable need for skilled nursing facility  Hypothyroidism appears clinically euthyroid with TSH normal  Mood disorder-stable  Fever of 100.82F with rhonchi on physical exam the previous day, now with chest x-ray revealing no significant disease, oxygen saturation normal, patient did have significant issues with cough and wheezing overnight as evidenced by nursing note however patient denies this given short-term memory deficits, currently asymptomatic with no wheezing on physical exam oxygen saturation normal, white blood cell count normal, Rocephin started empirically    LOS: 3 days   Rennie Rouch R 10/14/2011, 11:33 AM

## 2011-10-14 NOTE — Progress Notes (Signed)
Occupational Therapy Evaluation Patient Details Name: Heather Chan MRN: [id number] DOB: 01-21-1930 Today's Date: 10/14/2011  Problem List:  Patient Active Problem List  Diagnoses  . Stroke  . Gait instability  . Hypothyroidism  . Depression  . Alzheimer disease  . Hyperlipemia  . Osteoarthritis  . Palpitations    Past Medical History:  Past Medical History  Diagnosis Date  . Mental disorder   . DEMENTIA   . Hypothyroidism   . Arthritis   . Anxiety   . Depression   . Complication of anesthesia     hypotension post surgery  . Heart murmur   . Hypertension   . Stroke   . Recurrent upper respiratory infection (URI)    Past Surgical History:  Past Surgical History  Procedure Date  . Appendectomy   . Abdominal hysterectomy   . Back surgery   . Thyroidectomy     OT Assessment/Plan/Recommendation OT Assessment Clinical Impression Statement: Pt will benefit from acute OT to increase I with ADLs and functional transfers in prep for d/c to snf OT Recommendation/Assessment: Patient will need skilled OT in the acute care venue OT Problem List: Decreased activity tolerance;Decreased safety awareness;Decreased knowledge of use of DME or AE;Decreased cognition OT Therapy Diagnosis : Generalized weakness OT Plan OT Frequency: Min 2X/week OT Treatment/Interventions: Self-care/ADL training;DME and/or AE instruction;Therapeutic activities;Patient/family education OT Recommendation Follow Up Recommendations: Skilled nursing facility Equipment Recommended: Defer to next venue Individuals Consulted Consulted and Agree with Results and Recommendations: Patient OT Goals Acute Rehab OT Goals OT Goal Formulation: With patient Time For Goal Achievement: 7 days ADL Goals Pt Will Perform Grooming: with supervision;Standing at sink ADL Goal: Grooming - Progress: Other (comment) Pt Will Perform Lower Body Bathing: with set-up;Sit to stand from chair;Sit to stand from  bed;Unsupported ADL Goal: Lower Body Bathing - Progress: Other (comment) Pt Will Perform Lower Body Dressing: with set-up;Sit to stand from chair;Sit to stand from bed;Unsupported ADL Goal: Lower Body Dressing - Progress: Other (comment) Pt Will Transfer to Toilet: with supervision;Stand pivot transfer;3-in-1;with DME (DME if pt agreeable) ADL Goal: Toilet Transfer - Progress: Other (comment) Miscellaneous OT Goals Miscellaneous OT Goal #1: Pt will perform bed mobility with supervision in prep for EOB ADLs OT Goal: Miscellaneous Goal #1 - Progress: Other (comment)  OT Evaluation Precautions/Restrictions  Precautions Precautions: Fall Precaution Comments: Daughter reports declining balance/more tremulous x 3 days PTA; no h/o falls prior to fall resulting in admission Restrictions Weight Bearing Restrictions: No Prior Functioning Home Living Lives With: Alone Receives Help From: Family;Personal care attendant Type of Home: Other (Comment) (Not assessed due to anticipated need for short term SNF) Home Adaptive Equipment: None Prior Function Level of Independence: Independent with basic ADLs (per pt report, but unsure if pt's report of PLOF is accurate) ADL ADL Eating/Feeding: Performed;Independent Where Assessed - Eating/Feeding: Chair Grooming: Simulated;Set up Where Assessed - Grooming: Sitting, chair;Supported Upper Body Bathing: Simulated;Set up Where Assessed - Upper Body Bathing: Sitting, chair;Supported Lower Body Bathing: Simulated;Minimal assistance Lower Body Bathing Details (indicate cue type and reason): Min assist for support and safety during standing  Where Assessed - Lower Body Bathing: Sit to stand from chair;Supported Location manager Dressing: Simulated;Set up Where Assessed - Upper Body Dressing: Unsupported;Sitting, chair Lower Body Dressing: Simulated;Minimal assistance Lower Body Dressing Details (indicate cue type and reason): Min assist for support and safety  when standing. Pt able to don/doff bil socks sitting in chair. Where Assessed - Lower Body Dressing: Sit to stand from chair Toilet Transfer: Simulated;Minimal  assistance Toilet Transfer Details (indicate cue type and reason): Pt transferred from bed to chair with hand held assist Toilet Transfer Method: Stand pivot ADL Comments: Pt with decreased I with ADLs and functional transfers due to cognitive deficits and need for assistance when standing or during functional transfers Vision/Perception  Vision - Assessment Vision Assessment: Vision tested Additional Comments: Unable to fully assess vision due to pt's decreased ability to follow commands. Pt reports no visual deficits Cognition Cognition Arousal/Alertness: Awake/alert Overall Cognitive Status: History of cognitive impairments History of Cognitive Impairment: Appears at baseline functioning Orientation Level: Oriented to person;Disoriented to place;Disoriented to time Cognition - Other Comments: Pt demonstrated decreased short-term memory when continually asking the same questions throughout session. Sensation/Coordination Sensation Light Touch: Appears Intact Extremity Assessment RUE Assessment RUE Assessment: Within Functional Limits LUE Assessment LUE Assessment: Within Functional Limits Mobility  Bed Mobility Bed Mobility: Yes Rolling Left: 3: Mod assist;With rail Left Sidelying to Sit: 5: Supervision;HOB flat;With rails Sitting - Scoot to Edge of Bed: 5: Supervision Transfers Transfers: Yes Sit to Stand: 4: Min assist;With upper extremity assist;From chair/3-in-1 Sit to Stand Details (indicate cue type and reason): min guard for safety Stand to Sit: 4: Min assist;With upper extremity assist;To chair/3-in-1;With armrests Stand to Sit Details: min guard for safety Exercises   End of Session OT - End of Session Equipment Utilized During Treatment: Gait belt Activity Tolerance: Patient tolerated treatment  well Patient left: in chair;with call bell in reach Nurse Communication: Other (comment) (Informed RN that pt was in chair) General Behavior During Session: [facility] for tasks performed Cognition: Impaired, at baseline (per PT note)   Cipriano Mile 10/14/2011, 1:43 PM  10/14/2011 Cipriano Mile OTR/L Pager [phone] Office [phone]

## 2011-10-15 LAB — CBC
HCT: 36.6 % (ref 36.0–46.0)
MCH: 31.1 pg (ref 26.0–34.0)
MCHC: 33.9 g/dL (ref 30.0–36.0)
MCV: 91.7 fL (ref 78.0–100.0)
Platelets: 200 10*3/uL (ref 150–400)
RDW: 13.1 % (ref 11.5–15.5)

## 2011-10-15 LAB — GLUCOSE, CAPILLARY: Glucose-Capillary: 99 mg/dL (ref 70–99)

## 2011-10-15 MED ORDER — METOPROLOL TARTRATE 25 MG PO TABS: 25.0000 mg | ORAL_TABLET | Freq: Two times a day (BID) | ORAL | Status: DC

## 2011-10-15 MED ORDER — MOXIFLOXACIN HCL 400 MG PO TABS: 400.0000 mg | ORAL_TABLET | Freq: Every day | ORAL | Status: AC

## 2011-10-15 MED ORDER — LORAZEPAM 0.5 MG PO TABS: 0.5000 mg | ORAL_TABLET | Freq: Three times a day (TID) | ORAL | Status: AC | PRN

## 2011-10-15 MED ORDER — MOXIFLOXACIN HCL 400 MG PO TABS
400.0000 mg | ORAL_TABLET | Freq: Every day | ORAL | Status: DC
  Filled 2011-10-15: qty 1

## 2011-10-15 MED ORDER — CITALOPRAM HYDROBROMIDE 20 MG PO TABS: 20.0000 mg | ORAL_TABLET | Freq: Every day | ORAL | Status: AC

## 2011-10-15 NOTE — Discharge Summary (Signed)
DISCHARGE SUMMARY  Heather Chan  MR#: [id number]  DOB:10-09-1930  Date of Admission: 10/11/2011 Date of Discharge: 10/15/2011  Attending Physician:Uri Covey A  Patient's PCP:No primary provider on file.  Consults:  none  Discharge Diagnoses: Principal Problem:  *Stroke Active Problems:  Gait instability  Hypothyroidism  Palpitations Essential hypertension Moderately severe Alzheimer's dementia Questionable episode of aspiration  Discharge Medications: Current Discharge Medication List    START taking these medications   Details  LORazepam (ATIVAN) 0.5 MG tablet Take 1 tablet (0.5 mg total) by mouth every 8 (eight) hours as needed for anxiety. Qty: 30 tablet, Refills: 1    metoprolol tartrate (LOPRESSOR) 25 MG tablet Take 1 tablet (25 mg total) by mouth 2 (two) times daily. Qty: 60 tablet, Refills: 3    moxifloxacin (AVELOX) 400 MG tablet Take 1 tablet (400 mg total) by mouth daily at 6 PM. Qty: 6 tablet, Refills: 0      CONTINUE these medications which have CHANGED   Details  citalopram (CELEXA) 20 MG tablet Take 1 tablet (20 mg total) by mouth daily. Qty: 30 tablet, Refills: 5      CONTINUE these medications which have NOT CHANGED   Details  aspirin (BAYER ASPIRIN) 325 MG tablet Take 1 tablet (325 mg total) by mouth daily. Qty: 100 tablet, Refills: 3    donepezil (ARICEPT) 10 MG tablet Take 1 tablet (10 mg total) by mouth at bedtime. Qty: 30 tablet, Refills: 2    levothyroxine (SYNTHROID) 75 MCG tablet Take 1 tablet (75 mcg total) by mouth daily. Qty: 30 tablet, Refills: 11    memantine (NAMENDA) 10 MG tablet Take 10 mg by mouth 2 (two) times daily.      pravastatin (PRAVACHOL) 20 MG tablet Take 20 mg by mouth every evening.        STOP taking these medications     ibuprofen (ADVIL,MOTRIN) 200 MG tablet         Hospital Procedures: Dg Chest 2 View  10/13/2011  *RADIOLOGY REPORT*  Clinical Data: Fever.  Coarse rhonchi.  CHEST - 2 VIEW   Comparison: 11/06/2007  Findings: The heart is enlarged.  There is elevation of the right hemidiaphragm.  No evidence for pulmonary edema.  There are no focal consolidations or definite pleural effusions.  There are old left-sided rib fractures.  The lateral and posterior costophrenic angles are excluded on both views.  Small effusions could be excluded.  IMPRESSION:  1.  Cardiomegaly. 2. No focal pulmonary abnormality.  Original Report Authenticated By: Patterson Hammersmith, M.D.   Dg Swallowing Func-no Report  10/12/2011  CLINICAL DATA: dysphagia   FLUOROSCOPY FOR SWALLOWING FUNCTION STUDY:  Fluoroscopy was provided for swallowing function study, which was  administered by a speech pathologist.  Final results and recommendations  from this study are contained within the speech pathology report.      History of Present Illness: Patient is long-standing with myself at [facility] medical and most notably has had difficulty with progressively advanced Alzheimer's dementia. She's been care for largely by her very attentive daughter with some daytime help and it remained independent in this fashion. She has been in a state of cognitive and functional decline despite this but has been manageable from a nutritional and behavioral standpoint. Time of this admission she sustained a fall 21st prior to this and was seen by EMS with subsequent tension deferred to the next day through our office. She was seen by our nurse practitioner in conjunction with Dr. Jarold Motto extent that a  merchant MRI done it tried imaging outpatient did demonstrate a small microinfarction in the left occipital lobe. While she had no focal neurologic deficits her gait did remain unstable and this evidently was enough to decompensate her in terms of gait and further confusion. She was admitted to the hospital for further management.  Hospital Course: Patient was admitted placed on IV fluids and not felt to be a candidate for aggressive  anticoagulation given her cognitive and functional decline even prior to this small a vent. She did have carotid Dopplers which revealed no significant obstructive disease. She did have a speech evaluation which cleared her to the extent that diet was advanced. Focus was on palliative and conservative interventions given her ongoing quality of life decline from her baseline Alzheimer's dementia. She as well has had some borderline diabetic/glucose intolerance which as been well controlled without interventions. Metoprolol was added for some hypertensive control. She is well had an episode of questionable aspiration which cleared fairly quickly on exam with Avelox and bronchodilators to the extent that on the morning of discharge she is clear with no distress and no fever. At this point she weighed skilled nursing placement for OT PT and some attempt at recovery of ADLs and independence in hopes of either transfer home with 4 lower level of care.  Day of Discharge Exam BP 186/71  Pulse 82  Temp(Src) 99.5 F (37.5 C) (Oral)  Resp 20  Ht 5\' 3"  (1.6 m)  Wt 78.835 kg (173 lb 12.8 oz)  BMI 30.79 kg/m2  SpO2 91%  Physical Exam: General appearance: alert, cooperative and no distress Eyes: no scleral icterus Throat: oropharynx moist without erythema Resp: clear to auscultation bilaterally Cardio: regular rate and rhythm, S1, S2 normal, no murmur, click, rub or gallop Extremities: no clubbing, cyanosis or edema Abdomen is soft and nontender bowel sounds normal. Extremities reveal intact distal pulses normal color in negative Homans sign. Neurologically she is awake alert cooperative obviously confused but not agitated. No focal neurologic findings strength is 5 out of 5 throughout. She is no facial droop.  Discharge Labs:  [facility] 10/13/11 0600  NA 138  K 3.8  CL 103  CO2 27  GLUCOSE 118*  BUN 9  CREATININE 0.85  CALCIUM 8.4  MG --  PHOS --   No results found for this basename:  AST:2,ALT:2,ALKPHOS:2,BILITOT:2,PROT:2,ALBUMIN:2 in the last 72 hours  Basename 10/15/11 0630 10/14/11 0613  WBC 9.7 10.5  NEUTROABS -- --  HGB 12.4 11.7*  HCT 36.6 34.7*  MCV 91.7 92.0  PLT 200 176   No results found for this basename: CKTOTAL:3,CKMB:3,CKMBINDEX:3,TROPONINI:3 in the last 72 hours No results found for this basename: TSH,T4TOTAL,FREET3,T3FREE,THYROIDAB in the last 72 hours No results found for this basename: VITAMINB12:2,FOLATE:2,FERRITIN:2,TIBC:2,IRON:2,RETICCTPCT:2 in the last 72 hours  Discharge instructions: Discharge Orders    Future Orders Please Complete By Expires   Diet - low sodium heart healthy      Increase activity slowly         Disposition: Discharged to skilled nursing under the care of the house physician.  Condition on Discharge: Improved and stable condition , high risk for future decline for further events  Tests Needing Follow-up: None  Signed: Kenady Doxtater A 10/15/2011, 7:57 AM

## 2011-10-15 NOTE — Progress Notes (Signed)
Heather Chan has been discharged to [facility] skilled nursing facility today. Discharge information forwarded to facility. Daughter transporting patient to SNF and will complete admissions paperwork upon their arrival.  Genelle Bal, MSW, [place] [phone]

## 2011-10-30 DIAGNOSIS — E785 Hyperlipidemia, unspecified: Secondary | ICD-10-CM | POA: Diagnosis not present

## 2011-10-30 DIAGNOSIS — R279 Unspecified lack of coordination: Secondary | ICD-10-CM | POA: Diagnosis not present

## 2011-10-30 DIAGNOSIS — I69919 Unspecified symptoms and signs involving cognitive functions following unspecified cerebrovascular disease: Secondary | ICD-10-CM | POA: Diagnosis not present

## 2011-10-30 DIAGNOSIS — I6350 Cerebral infarction due to unspecified occlusion or stenosis of unspecified cerebral artery: Secondary | ICD-10-CM | POA: Diagnosis not present

## 2011-10-30 DIAGNOSIS — I69993 Ataxia following unspecified cerebrovascular disease: Secondary | ICD-10-CM | POA: Diagnosis not present

## 2011-10-30 DIAGNOSIS — R4189 Other symptoms and signs involving cognitive functions and awareness: Secondary | ICD-10-CM | POA: Diagnosis not present

## 2011-10-30 DIAGNOSIS — R262 Difficulty in walking, not elsewhere classified: Secondary | ICD-10-CM | POA: Diagnosis not present

## 2011-10-30 DIAGNOSIS — E039 Hypothyroidism, unspecified: Secondary | ICD-10-CM | POA: Diagnosis not present

## 2011-10-30 DIAGNOSIS — R41840 Attention and concentration deficit: Secondary | ICD-10-CM | POA: Diagnosis not present

## 2011-10-30 DIAGNOSIS — R41841 Cognitive communication deficit: Secondary | ICD-10-CM | POA: Diagnosis not present

## 2011-10-30 DIAGNOSIS — F068 Other specified mental disorders due to known physiological condition: Secondary | ICD-10-CM | POA: Diagnosis not present

## 2011-11-03 DIAGNOSIS — F068 Other specified mental disorders due to known physiological condition: Secondary | ICD-10-CM | POA: Diagnosis not present

## 2011-11-03 DIAGNOSIS — E039 Hypothyroidism, unspecified: Secondary | ICD-10-CM | POA: Diagnosis not present

## 2011-11-03 DIAGNOSIS — E785 Hyperlipidemia, unspecified: Secondary | ICD-10-CM | POA: Diagnosis not present

## 2011-11-07 DIAGNOSIS — R269 Unspecified abnormalities of gait and mobility: Secondary | ICD-10-CM | POA: Diagnosis not present

## 2011-11-07 DIAGNOSIS — F0280 Dementia in other diseases classified elsewhere without behavioral disturbance: Secondary | ICD-10-CM | POA: Diagnosis not present

## 2011-11-07 DIAGNOSIS — I69998 Other sequelae following unspecified cerebrovascular disease: Secondary | ICD-10-CM | POA: Diagnosis not present

## 2011-11-07 DIAGNOSIS — Z5189 Encounter for other specified aftercare: Secondary | ICD-10-CM | POA: Diagnosis not present

## 2011-11-09 DIAGNOSIS — Z5189 Encounter for other specified aftercare: Secondary | ICD-10-CM | POA: Diagnosis not present

## 2011-11-09 DIAGNOSIS — F0280 Dementia in other diseases classified elsewhere without behavioral disturbance: Secondary | ICD-10-CM | POA: Diagnosis not present

## 2011-11-09 DIAGNOSIS — I69998 Other sequelae following unspecified cerebrovascular disease: Secondary | ICD-10-CM | POA: Diagnosis not present

## 2011-11-09 DIAGNOSIS — R269 Unspecified abnormalities of gait and mobility: Secondary | ICD-10-CM | POA: Diagnosis not present

## 2011-11-13 DIAGNOSIS — I69998 Other sequelae following unspecified cerebrovascular disease: Secondary | ICD-10-CM | POA: Diagnosis not present

## 2011-11-13 DIAGNOSIS — R269 Unspecified abnormalities of gait and mobility: Secondary | ICD-10-CM | POA: Diagnosis not present

## 2011-11-13 DIAGNOSIS — F0280 Dementia in other diseases classified elsewhere without behavioral disturbance: Secondary | ICD-10-CM | POA: Diagnosis not present

## 2011-11-13 DIAGNOSIS — Z5189 Encounter for other specified aftercare: Secondary | ICD-10-CM | POA: Diagnosis not present

## 2011-11-13 DIAGNOSIS — G309 Alzheimer's disease, unspecified: Secondary | ICD-10-CM | POA: Diagnosis not present

## 2011-11-15 DIAGNOSIS — G309 Alzheimer's disease, unspecified: Secondary | ICD-10-CM | POA: Diagnosis not present

## 2011-11-15 DIAGNOSIS — R269 Unspecified abnormalities of gait and mobility: Secondary | ICD-10-CM | POA: Diagnosis not present

## 2011-11-15 DIAGNOSIS — F0280 Dementia in other diseases classified elsewhere without behavioral disturbance: Secondary | ICD-10-CM | POA: Diagnosis not present

## 2011-11-15 DIAGNOSIS — Z5189 Encounter for other specified aftercare: Secondary | ICD-10-CM | POA: Diagnosis not present

## 2011-11-15 DIAGNOSIS — I69998 Other sequelae following unspecified cerebrovascular disease: Secondary | ICD-10-CM | POA: Diagnosis not present

## 2011-11-19 DIAGNOSIS — I69998 Other sequelae following unspecified cerebrovascular disease: Secondary | ICD-10-CM | POA: Diagnosis not present

## 2011-11-19 DIAGNOSIS — R269 Unspecified abnormalities of gait and mobility: Secondary | ICD-10-CM | POA: Diagnosis not present

## 2011-11-19 DIAGNOSIS — F0280 Dementia in other diseases classified elsewhere without behavioral disturbance: Secondary | ICD-10-CM | POA: Diagnosis not present

## 2011-11-19 DIAGNOSIS — Z5189 Encounter for other specified aftercare: Secondary | ICD-10-CM | POA: Diagnosis not present

## 2011-11-20 DIAGNOSIS — G309 Alzheimer's disease, unspecified: Secondary | ICD-10-CM | POA: Diagnosis not present

## 2011-11-20 DIAGNOSIS — R269 Unspecified abnormalities of gait and mobility: Secondary | ICD-10-CM | POA: Diagnosis not present

## 2011-11-20 DIAGNOSIS — I69998 Other sequelae following unspecified cerebrovascular disease: Secondary | ICD-10-CM | POA: Diagnosis not present

## 2011-11-20 DIAGNOSIS — F0280 Dementia in other diseases classified elsewhere without behavioral disturbance: Secondary | ICD-10-CM | POA: Diagnosis not present

## 2011-11-20 DIAGNOSIS — Z5189 Encounter for other specified aftercare: Secondary | ICD-10-CM | POA: Diagnosis not present

## 2011-11-22 DIAGNOSIS — R269 Unspecified abnormalities of gait and mobility: Secondary | ICD-10-CM | POA: Diagnosis not present

## 2011-11-22 DIAGNOSIS — Z5189 Encounter for other specified aftercare: Secondary | ICD-10-CM | POA: Diagnosis not present

## 2011-11-22 DIAGNOSIS — I69998 Other sequelae following unspecified cerebrovascular disease: Secondary | ICD-10-CM | POA: Diagnosis not present

## 2011-11-22 DIAGNOSIS — F0280 Dementia in other diseases classified elsewhere without behavioral disturbance: Secondary | ICD-10-CM | POA: Diagnosis not present

## 2011-11-27 DIAGNOSIS — G309 Alzheimer's disease, unspecified: Secondary | ICD-10-CM | POA: Diagnosis not present

## 2011-11-27 DIAGNOSIS — R269 Unspecified abnormalities of gait and mobility: Secondary | ICD-10-CM | POA: Diagnosis not present

## 2011-11-27 DIAGNOSIS — F0280 Dementia in other diseases classified elsewhere without behavioral disturbance: Secondary | ICD-10-CM | POA: Diagnosis not present

## 2011-11-27 DIAGNOSIS — I69998 Other sequelae following unspecified cerebrovascular disease: Secondary | ICD-10-CM | POA: Diagnosis not present

## 2011-11-27 DIAGNOSIS — Z5189 Encounter for other specified aftercare: Secondary | ICD-10-CM | POA: Diagnosis not present

## 2011-11-29 DIAGNOSIS — F0280 Dementia in other diseases classified elsewhere without behavioral disturbance: Secondary | ICD-10-CM | POA: Diagnosis not present

## 2011-11-29 DIAGNOSIS — Z5189 Encounter for other specified aftercare: Secondary | ICD-10-CM | POA: Diagnosis not present

## 2011-11-29 DIAGNOSIS — G309 Alzheimer's disease, unspecified: Secondary | ICD-10-CM | POA: Diagnosis not present

## 2011-11-29 DIAGNOSIS — I69998 Other sequelae following unspecified cerebrovascular disease: Secondary | ICD-10-CM | POA: Diagnosis not present

## 2011-11-29 DIAGNOSIS — R269 Unspecified abnormalities of gait and mobility: Secondary | ICD-10-CM | POA: Diagnosis not present

## 2011-12-05 DIAGNOSIS — I69998 Other sequelae following unspecified cerebrovascular disease: Secondary | ICD-10-CM | POA: Diagnosis not present

## 2011-12-05 DIAGNOSIS — Z5189 Encounter for other specified aftercare: Secondary | ICD-10-CM | POA: Diagnosis not present

## 2011-12-05 DIAGNOSIS — R269 Unspecified abnormalities of gait and mobility: Secondary | ICD-10-CM | POA: Diagnosis not present

## 2011-12-05 DIAGNOSIS — F0280 Dementia in other diseases classified elsewhere without behavioral disturbance: Secondary | ICD-10-CM | POA: Diagnosis not present

## 2012-01-15 DIAGNOSIS — E039 Hypothyroidism, unspecified: Secondary | ICD-10-CM | POA: Diagnosis not present

## 2012-01-15 DIAGNOSIS — I10 Essential (primary) hypertension: Secondary | ICD-10-CM | POA: Diagnosis not present

## 2012-01-15 DIAGNOSIS — E785 Hyperlipidemia, unspecified: Secondary | ICD-10-CM | POA: Diagnosis not present

## 2012-01-22 DIAGNOSIS — Z0000 Encounter for general adult medical examination without abnormal findings: Secondary | ICD-10-CM | POA: Diagnosis not present

## 2012-01-22 DIAGNOSIS — E785 Hyperlipidemia, unspecified: Secondary | ICD-10-CM | POA: Diagnosis not present

## 2012-01-22 DIAGNOSIS — F329 Major depressive disorder, single episode, unspecified: Secondary | ICD-10-CM | POA: Diagnosis not present

## 2012-01-22 DIAGNOSIS — I10 Essential (primary) hypertension: Secondary | ICD-10-CM | POA: Diagnosis not present

## 2012-03-04 DIAGNOSIS — F329 Major depressive disorder, single episode, unspecified: Secondary | ICD-10-CM | POA: Diagnosis not present

## 2012-03-04 DIAGNOSIS — F0280 Dementia in other diseases classified elsewhere without behavioral disturbance: Secondary | ICD-10-CM | POA: Diagnosis not present

## 2012-03-04 DIAGNOSIS — G309 Alzheimer's disease, unspecified: Secondary | ICD-10-CM | POA: Diagnosis not present

## 2012-07-28 DIAGNOSIS — F329 Major depressive disorder, single episode, unspecified: Secondary | ICD-10-CM | POA: Diagnosis not present

## 2012-07-28 DIAGNOSIS — Z23 Encounter for immunization: Secondary | ICD-10-CM | POA: Diagnosis not present

## 2012-07-28 DIAGNOSIS — I10 Essential (primary) hypertension: Secondary | ICD-10-CM | POA: Diagnosis not present

## 2012-07-28 DIAGNOSIS — E039 Hypothyroidism, unspecified: Secondary | ICD-10-CM | POA: Diagnosis not present

## 2012-09-04 DIAGNOSIS — G309 Alzheimer's disease, unspecified: Secondary | ICD-10-CM | POA: Diagnosis not present

## 2012-09-04 DIAGNOSIS — F329 Major depressive disorder, single episode, unspecified: Secondary | ICD-10-CM | POA: Diagnosis not present

## 2012-09-04 DIAGNOSIS — I10 Essential (primary) hypertension: Secondary | ICD-10-CM | POA: Diagnosis not present

## 2012-09-04 DIAGNOSIS — F0280 Dementia in other diseases classified elsewhere without behavioral disturbance: Secondary | ICD-10-CM | POA: Diagnosis not present

## 2013-01-26 DIAGNOSIS — I10 Essential (primary) hypertension: Secondary | ICD-10-CM | POA: Diagnosis not present

## 2013-01-26 DIAGNOSIS — F0280 Dementia in other diseases classified elsewhere without behavioral disturbance: Secondary | ICD-10-CM | POA: Diagnosis not present

## 2013-01-26 DIAGNOSIS — F329 Major depressive disorder, single episode, unspecified: Secondary | ICD-10-CM | POA: Diagnosis not present

## 2013-01-26 DIAGNOSIS — E785 Hyperlipidemia, unspecified: Secondary | ICD-10-CM | POA: Diagnosis not present

## 2013-01-26 DIAGNOSIS — E039 Hypothyroidism, unspecified: Secondary | ICD-10-CM | POA: Diagnosis not present

## 2013-01-26 DIAGNOSIS — M1990 Unspecified osteoarthritis, unspecified site: Secondary | ICD-10-CM | POA: Diagnosis not present

## 2013-01-26 DIAGNOSIS — Z0000 Encounter for general adult medical examination without abnormal findings: Secondary | ICD-10-CM | POA: Diagnosis not present

## 2013-03-04 DIAGNOSIS — G309 Alzheimer's disease, unspecified: Secondary | ICD-10-CM | POA: Diagnosis not present

## 2013-03-04 DIAGNOSIS — I10 Essential (primary) hypertension: Secondary | ICD-10-CM | POA: Diagnosis not present

## 2013-03-04 DIAGNOSIS — F329 Major depressive disorder, single episode, unspecified: Secondary | ICD-10-CM | POA: Diagnosis not present

## 2013-03-04 DIAGNOSIS — Z6830 Body mass index (BMI) 30.0-30.9, adult: Secondary | ICD-10-CM | POA: Diagnosis not present

## 2013-03-04 DIAGNOSIS — F0280 Dementia in other diseases classified elsewhere without behavioral disturbance: Secondary | ICD-10-CM | POA: Diagnosis not present

## 2013-06-16 IMAGING — CR DG CHEST 2V
1 series · 1 of 1 positions shown · non-contrast
Comparison: 11/06/2007

CLINICAL DATA: Fever.  Coarse rhonchi.

CHEST - 2 VIEW

[w chest lat]
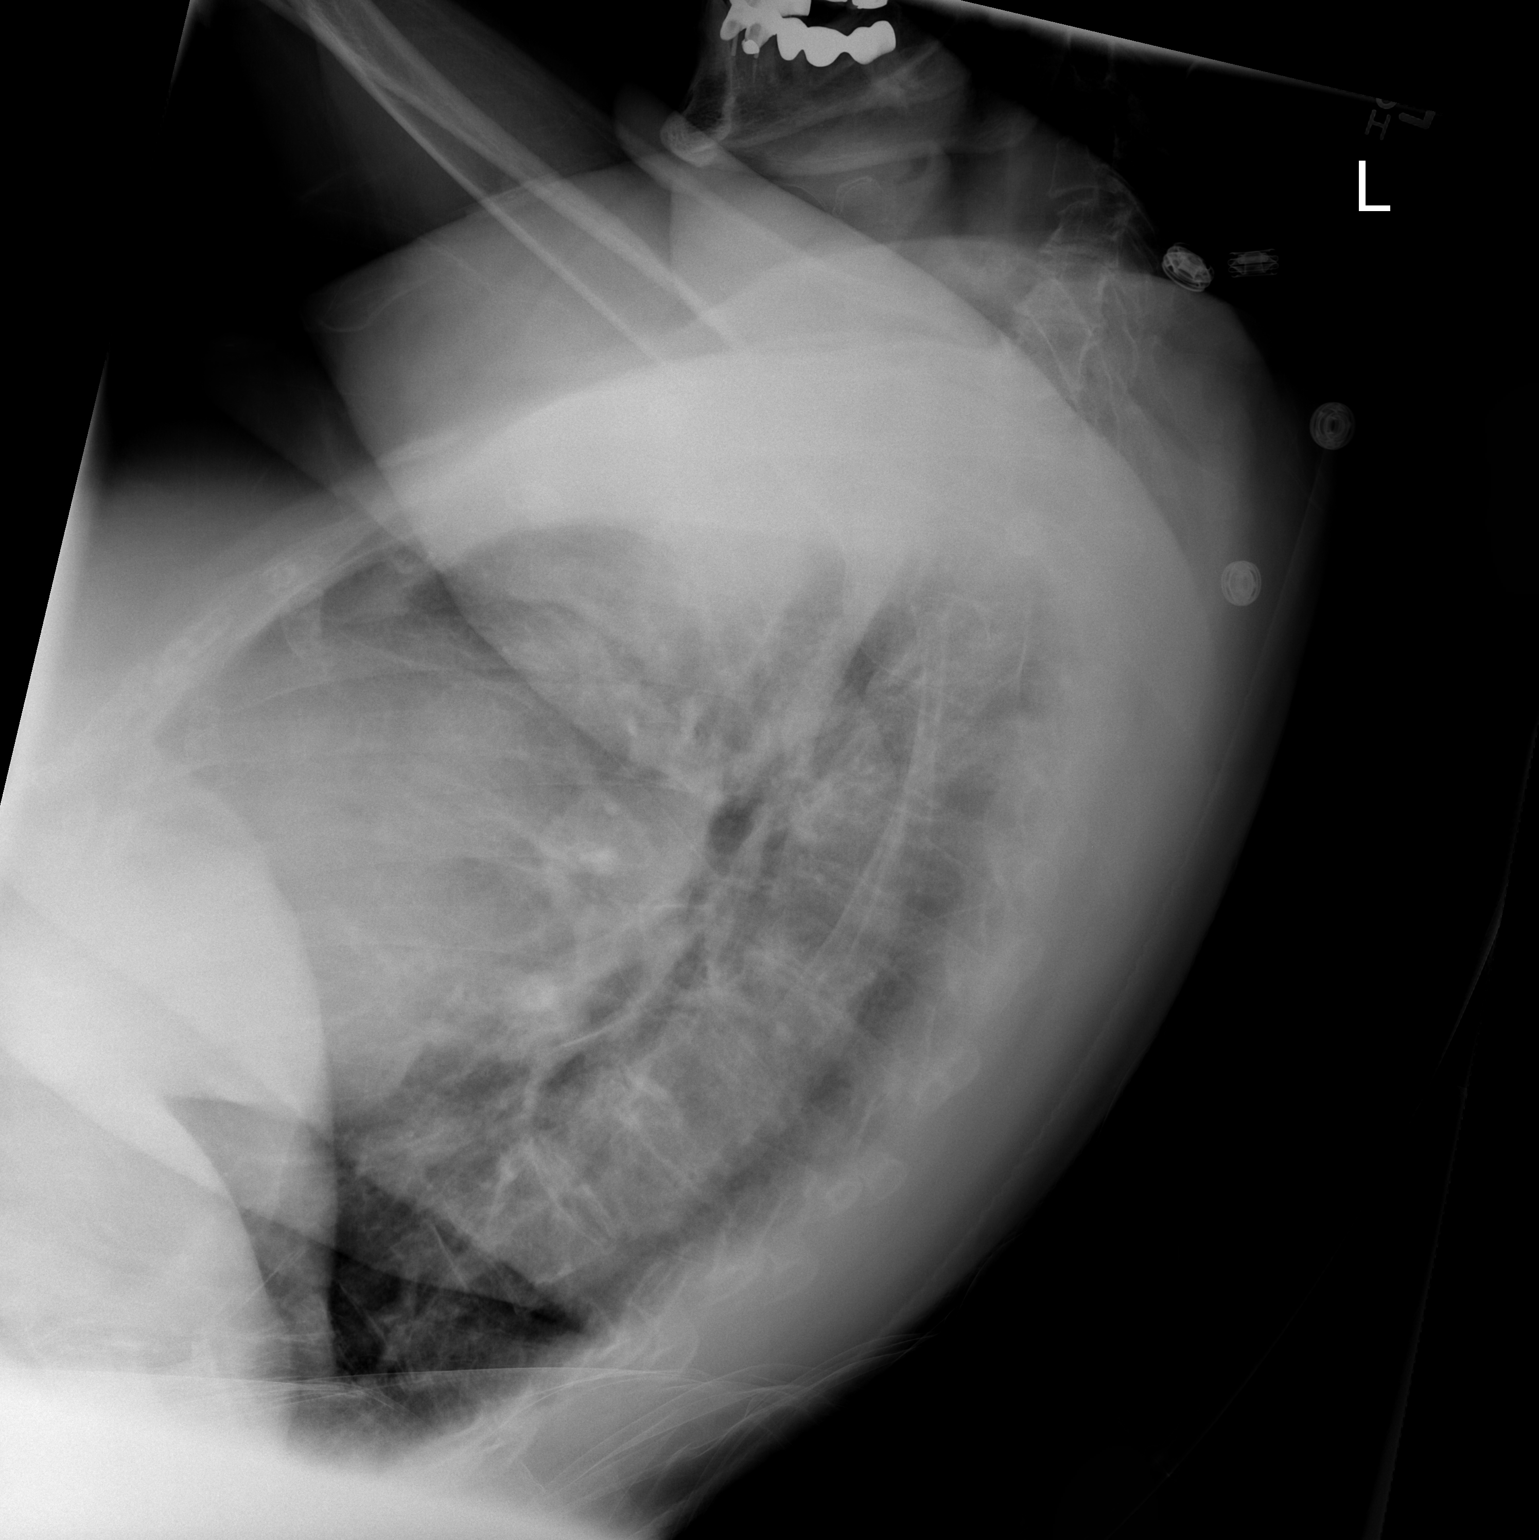

[1 of 1 positions shown; findings below may reference images not displayed]

FINDINGS: The heart is enlarged.  There is elevation of the right
hemidiaphragm.  No evidence for pulmonary edema.  There are no
focal consolidations or definite pleural effusions.  There are old
left-sided rib fractures.  The lateral and posterior costophrenic
angles are excluded on both views.  Small effusions could be
excluded.
IMPRESSION: 1.  Cardiomegaly.
2. No focal pulmonary abnormality.

## 2013-07-27 DIAGNOSIS — F0280 Dementia in other diseases classified elsewhere without behavioral disturbance: Secondary | ICD-10-CM | POA: Diagnosis not present

## 2013-07-27 DIAGNOSIS — E785 Hyperlipidemia, unspecified: Secondary | ICD-10-CM | POA: Diagnosis not present

## 2013-07-27 DIAGNOSIS — Z1331 Encounter for screening for depression: Secondary | ICD-10-CM | POA: Diagnosis not present

## 2013-07-27 DIAGNOSIS — I10 Essential (primary) hypertension: Secondary | ICD-10-CM | POA: Diagnosis not present

## 2013-07-27 DIAGNOSIS — E039 Hypothyroidism, unspecified: Secondary | ICD-10-CM | POA: Diagnosis not present

## 2013-07-27 DIAGNOSIS — Z6829 Body mass index (BMI) 29.0-29.9, adult: Secondary | ICD-10-CM | POA: Diagnosis not present

## 2013-07-27 DIAGNOSIS — F329 Major depressive disorder, single episode, unspecified: Secondary | ICD-10-CM | POA: Diagnosis not present

## 2013-07-27 DIAGNOSIS — Z23 Encounter for immunization: Secondary | ICD-10-CM | POA: Diagnosis not present

## 2013-07-27 DIAGNOSIS — M1990 Unspecified osteoarthritis, unspecified site: Secondary | ICD-10-CM | POA: Diagnosis not present

## 2013-08-10 ENCOUNTER — Telehealth: Payer: Self-pay | Admitting: Pharmacist

## 2013-08-10 NOTE — Telephone Encounter (Signed)
Opened in error - no notes.

## 2013-09-08 DIAGNOSIS — I10 Essential (primary) hypertension: Secondary | ICD-10-CM | POA: Diagnosis not present

## 2013-09-08 DIAGNOSIS — F329 Major depressive disorder, single episode, unspecified: Secondary | ICD-10-CM | POA: Diagnosis not present

## 2013-09-08 DIAGNOSIS — F0280 Dementia in other diseases classified elsewhere without behavioral disturbance: Secondary | ICD-10-CM | POA: Diagnosis not present

## 2013-09-17 DIAGNOSIS — F0280 Dementia in other diseases classified elsewhere without behavioral disturbance: Secondary | ICD-10-CM | POA: Diagnosis not present

## 2013-09-17 DIAGNOSIS — I10 Essential (primary) hypertension: Secondary | ICD-10-CM | POA: Diagnosis not present

## 2013-09-17 DIAGNOSIS — M1990 Unspecified osteoarthritis, unspecified site: Secondary | ICD-10-CM | POA: Diagnosis not present

## 2013-09-17 DIAGNOSIS — Z9181 History of falling: Secondary | ICD-10-CM | POA: Diagnosis not present

## 2013-11-20 DIAGNOSIS — R404 Transient alteration of awareness: Secondary | ICD-10-CM | POA: Diagnosis not present

## 2013-11-20 DIAGNOSIS — R55 Syncope and collapse: Secondary | ICD-10-CM | POA: Diagnosis not present

## 2014-01-18 DIAGNOSIS — Z6830 Body mass index (BMI) 30.0-30.9, adult: Secondary | ICD-10-CM | POA: Diagnosis not present

## 2014-01-18 DIAGNOSIS — M19049 Primary osteoarthritis, unspecified hand: Secondary | ICD-10-CM | POA: Diagnosis not present

## 2014-01-18 DIAGNOSIS — R5383 Other fatigue: Secondary | ICD-10-CM | POA: Diagnosis not present

## 2014-01-18 DIAGNOSIS — M25539 Pain in unspecified wrist: Secondary | ICD-10-CM | POA: Diagnosis not present

## 2014-01-18 DIAGNOSIS — R404 Transient alteration of awareness: Secondary | ICD-10-CM | POA: Diagnosis not present

## 2014-01-18 DIAGNOSIS — R5381 Other malaise: Secondary | ICD-10-CM | POA: Diagnosis not present

## 2014-01-19 DIAGNOSIS — M25539 Pain in unspecified wrist: Secondary | ICD-10-CM | POA: Diagnosis not present

## 2014-01-22 DIAGNOSIS — E785 Hyperlipidemia, unspecified: Secondary | ICD-10-CM | POA: Diagnosis not present

## 2014-01-22 DIAGNOSIS — E039 Hypothyroidism, unspecified: Secondary | ICD-10-CM | POA: Diagnosis not present

## 2014-01-22 DIAGNOSIS — I10 Essential (primary) hypertension: Secondary | ICD-10-CM | POA: Diagnosis not present

## 2014-02-04 DIAGNOSIS — M25539 Pain in unspecified wrist: Secondary | ICD-10-CM | POA: Diagnosis not present

## 2014-02-05 DIAGNOSIS — F329 Major depressive disorder, single episode, unspecified: Secondary | ICD-10-CM | POA: Diagnosis not present

## 2014-02-05 DIAGNOSIS — Z0000 Encounter for general adult medical examination without abnormal findings: Secondary | ICD-10-CM | POA: Diagnosis not present

## 2014-02-05 DIAGNOSIS — F0280 Dementia in other diseases classified elsewhere without behavioral disturbance: Secondary | ICD-10-CM | POA: Diagnosis not present

## 2014-02-05 DIAGNOSIS — E785 Hyperlipidemia, unspecified: Secondary | ICD-10-CM | POA: Diagnosis not present

## 2014-02-05 DIAGNOSIS — I10 Essential (primary) hypertension: Secondary | ICD-10-CM | POA: Diagnosis not present

## 2014-02-05 DIAGNOSIS — G309 Alzheimer's disease, unspecified: Secondary | ICD-10-CM | POA: Diagnosis not present

## 2014-02-05 DIAGNOSIS — F3289 Other specified depressive episodes: Secondary | ICD-10-CM | POA: Diagnosis not present

## 2014-02-05 DIAGNOSIS — E039 Hypothyroidism, unspecified: Secondary | ICD-10-CM | POA: Diagnosis not present

## 2014-02-16 ENCOUNTER — Ambulatory Visit: Payer: Medicare Other | Attending: Orthopedic Surgery | Admitting: Physical Therapy

## 2014-02-16 DIAGNOSIS — R5381 Other malaise: Secondary | ICD-10-CM | POA: Insufficient documentation

## 2014-02-16 DIAGNOSIS — IMO0001 Reserved for inherently not codable concepts without codable children: Secondary | ICD-10-CM | POA: Diagnosis not present

## 2014-02-16 DIAGNOSIS — M2560 Stiffness of unspecified joint, not elsewhere classified: Secondary | ICD-10-CM | POA: Diagnosis not present

## 2014-02-17 ENCOUNTER — Ambulatory Visit: Payer: Medicare Other | Admitting: Physical Therapy

## 2014-02-17 DIAGNOSIS — R5381 Other malaise: Secondary | ICD-10-CM | POA: Diagnosis not present

## 2014-02-17 DIAGNOSIS — M2560 Stiffness of unspecified joint, not elsewhere classified: Secondary | ICD-10-CM | POA: Diagnosis not present

## 2014-02-17 DIAGNOSIS — IMO0001 Reserved for inherently not codable concepts without codable children: Secondary | ICD-10-CM | POA: Diagnosis not present

## 2014-02-22 ENCOUNTER — Ambulatory Visit: Payer: Medicare Other | Admitting: Physical Therapy

## 2014-02-22 DIAGNOSIS — R5381 Other malaise: Secondary | ICD-10-CM | POA: Diagnosis not present

## 2014-02-22 DIAGNOSIS — IMO0001 Reserved for inherently not codable concepts without codable children: Secondary | ICD-10-CM | POA: Diagnosis not present

## 2014-02-22 DIAGNOSIS — M2560 Stiffness of unspecified joint, not elsewhere classified: Secondary | ICD-10-CM | POA: Diagnosis not present

## 2014-02-24 ENCOUNTER — Ambulatory Visit: Payer: Medicare Other | Admitting: Physical Therapy

## 2014-02-24 DIAGNOSIS — M2560 Stiffness of unspecified joint, not elsewhere classified: Secondary | ICD-10-CM | POA: Diagnosis not present

## 2014-02-24 DIAGNOSIS — IMO0001 Reserved for inherently not codable concepts without codable children: Secondary | ICD-10-CM | POA: Diagnosis not present

## 2014-02-24 DIAGNOSIS — R5381 Other malaise: Secondary | ICD-10-CM | POA: Diagnosis not present

## 2014-03-04 ENCOUNTER — Ambulatory Visit: Payer: Medicare Other | Attending: Orthopedic Surgery | Admitting: *Deleted

## 2014-03-04 DIAGNOSIS — IMO0001 Reserved for inherently not codable concepts without codable children: Secondary | ICD-10-CM | POA: Insufficient documentation

## 2014-03-04 DIAGNOSIS — M2560 Stiffness of unspecified joint, not elsewhere classified: Secondary | ICD-10-CM | POA: Diagnosis not present

## 2014-03-04 DIAGNOSIS — R5381 Other malaise: Secondary | ICD-10-CM | POA: Diagnosis not present

## 2014-03-29 ENCOUNTER — Encounter: Payer: Self-pay | Admitting: *Deleted

## 2014-09-06 DIAGNOSIS — Z23 Encounter for immunization: Secondary | ICD-10-CM | POA: Diagnosis not present

## 2014-11-12 DIAGNOSIS — R404 Transient alteration of awareness: Secondary | ICD-10-CM | POA: Diagnosis not present

## 2014-11-12 DIAGNOSIS — R531 Weakness: Secondary | ICD-10-CM | POA: Diagnosis not present

## 2015-02-11 DIAGNOSIS — R55 Syncope and collapse: Secondary | ICD-10-CM | POA: Diagnosis not present

## 2015-02-11 DIAGNOSIS — R404 Transient alteration of awareness: Secondary | ICD-10-CM | POA: Diagnosis not present

## 2015-04-14 DIAGNOSIS — R55 Syncope and collapse: Secondary | ICD-10-CM | POA: Diagnosis not present

## 2015-04-14 DIAGNOSIS — R404 Transient alteration of awareness: Secondary | ICD-10-CM | POA: Diagnosis not present

## 2015-07-06 DIAGNOSIS — Z0000 Encounter for general adult medical examination without abnormal findings: Secondary | ICD-10-CM | POA: Diagnosis not present

## 2015-07-06 DIAGNOSIS — Z1389 Encounter for screening for other disorder: Secondary | ICD-10-CM | POA: Diagnosis not present

## 2015-07-06 DIAGNOSIS — E039 Hypothyroidism, unspecified: Secondary | ICD-10-CM | POA: Diagnosis not present

## 2015-07-06 DIAGNOSIS — E785 Hyperlipidemia, unspecified: Secondary | ICD-10-CM | POA: Diagnosis not present

## 2015-07-06 DIAGNOSIS — I639 Cerebral infarction, unspecified: Secondary | ICD-10-CM | POA: Diagnosis not present

## 2015-07-06 DIAGNOSIS — G309 Alzheimer's disease, unspecified: Secondary | ICD-10-CM | POA: Diagnosis not present

## 2015-07-06 DIAGNOSIS — M1990 Unspecified osteoarthritis, unspecified site: Secondary | ICD-10-CM | POA: Diagnosis not present

## 2015-07-06 DIAGNOSIS — Z6828 Body mass index (BMI) 28.0-28.9, adult: Secondary | ICD-10-CM | POA: Diagnosis not present

## 2015-07-06 DIAGNOSIS — I10 Essential (primary) hypertension: Secondary | ICD-10-CM | POA: Diagnosis not present

## 2015-08-03 DIAGNOSIS — I10 Essential (primary) hypertension: Secondary | ICD-10-CM | POA: Diagnosis not present

## 2015-09-20 ENCOUNTER — Inpatient Hospital Stay (HOSPITAL_COMMUNITY)
Admission: EM | Admit: 2015-09-20 | Discharge: 2015-09-23 | DRG: 175 | Disposition: A | Discharge status: Home Health | Payer: Medicare Other | Source: Ambulatory Visit | Attending: Internal Medicine | Admitting: Internal Medicine

## 2015-09-20 ENCOUNTER — Encounter (HOSPITAL_COMMUNITY): Payer: Self-pay | Admitting: Emergency Medicine

## 2015-09-20 DIAGNOSIS — B9620 Unspecified Escherichia coli [E. coli] as the cause of diseases classified elsewhere: Secondary | ICD-10-CM | POA: Diagnosis present

## 2015-09-20 DIAGNOSIS — F329 Major depressive disorder, single episode, unspecified: Secondary | ICD-10-CM | POA: Diagnosis present

## 2015-09-20 DIAGNOSIS — N179 Acute kidney failure, unspecified: Secondary | ICD-10-CM | POA: Diagnosis present

## 2015-09-20 DIAGNOSIS — D649 Anemia, unspecified: Secondary | ICD-10-CM | POA: Diagnosis present

## 2015-09-20 DIAGNOSIS — I10 Essential (primary) hypertension: Secondary | ICD-10-CM | POA: Diagnosis present

## 2015-09-20 DIAGNOSIS — I2699 Other pulmonary embolism without acute cor pulmonale: Secondary | ICD-10-CM | POA: Diagnosis not present

## 2015-09-20 DIAGNOSIS — F419 Anxiety disorder, unspecified: Secondary | ICD-10-CM | POA: Diagnosis present

## 2015-09-20 DIAGNOSIS — R0902 Hypoxemia: Secondary | ICD-10-CM | POA: Diagnosis not present

## 2015-09-20 DIAGNOSIS — I2602 Saddle embolus of pulmonary artery with acute cor pulmonale: Secondary | ICD-10-CM

## 2015-09-20 DIAGNOSIS — J9601 Acute respiratory failure with hypoxia: Secondary | ICD-10-CM | POA: Diagnosis present

## 2015-09-20 DIAGNOSIS — F0280 Dementia in other diseases classified elsewhere without behavioral disturbance: Secondary | ICD-10-CM | POA: Diagnosis present

## 2015-09-20 DIAGNOSIS — N390 Urinary tract infection, site not specified: Secondary | ICD-10-CM | POA: Diagnosis present

## 2015-09-20 DIAGNOSIS — G309 Alzheimer's disease, unspecified: Secondary | ICD-10-CM | POA: Diagnosis present

## 2015-09-20 DIAGNOSIS — I82442 Acute embolism and thrombosis of left tibial vein: Secondary | ICD-10-CM | POA: Diagnosis present

## 2015-09-20 DIAGNOSIS — E785 Hyperlipidemia, unspecified: Secondary | ICD-10-CM | POA: Diagnosis present

## 2015-09-20 DIAGNOSIS — Z6828 Body mass index (BMI) 28.0-28.9, adult: Secondary | ICD-10-CM | POA: Diagnosis not present

## 2015-09-20 DIAGNOSIS — E039 Hypothyroidism, unspecified: Secondary | ICD-10-CM | POA: Diagnosis present

## 2015-09-20 DIAGNOSIS — R0602 Shortness of breath: Secondary | ICD-10-CM | POA: Diagnosis not present

## 2015-09-20 DIAGNOSIS — I2692 Saddle embolus of pulmonary artery without acute cor pulmonale: Secondary | ICD-10-CM | POA: Diagnosis not present

## 2015-09-20 LAB — CBC WITH DIFFERENTIAL/PLATELET
BASOS ABS: 0.1 10*3/uL (ref 0.0–0.1)
BASOS PCT: 1 %
EOS ABS: 0.1 10*3/uL (ref 0.0–0.7)
EOS PCT: 0 %
HCT: 35.8 % — ABNORMAL LOW (ref 36.0–46.0)
Hemoglobin: 10.4 g/dL — ABNORMAL LOW (ref 12.0–15.0)
Lymphocytes Relative: 8 %
Lymphs Abs: 1.2 10*3/uL (ref 0.7–4.0)
MCH: 22.8 pg — ABNORMAL LOW (ref 26.0–34.0)
MCHC: 29.1 g/dL — AB (ref 30.0–36.0)
MCV: 78.5 fL (ref 78.0–100.0)
MONO ABS: 1.5 10*3/uL — AB (ref 0.1–1.0)
MONOS PCT: 10 %
NEUTROS ABS: 11.8 10*3/uL — AB (ref 1.7–7.7)
Neutrophils Relative %: 81 %
PLATELETS: 280 10*3/uL (ref 150–400)
RBC: 4.56 MIL/uL (ref 3.87–5.11)
RDW: 19.2 % — AB (ref 11.5–15.5)
WBC: 14.6 10*3/uL — ABNORMAL HIGH (ref 4.0–10.5)

## 2015-09-20 LAB — BASIC METABOLIC PANEL
ANION GAP: 12 (ref 5–15)
BUN: 12 mg/dL (ref 6–20)
CALCIUM: 8.8 mg/dL — AB (ref 8.9–10.3)
CO2: 22 mmol/L (ref 22–32)
CREATININE: 1.29 mg/dL — AB (ref 0.44–1.00)
Chloride: 103 mmol/L (ref 101–111)
GFR, EST AFRICAN AMERICAN: 43 mL/min — AB (ref >60)
GFR, EST NON AFRICAN AMERICAN: 37 mL/min — AB (ref >60)
Glucose, Bld: 118 mg/dL — ABNORMAL HIGH (ref 65–99)
Potassium: 4.1 mmol/L (ref 3.5–5.1)
SODIUM: 137 mmol/L (ref 135–145)

## 2015-09-20 LAB — I-STAT TROPONIN, ED: TROPONIN I, POC: 0.01 ng/mL (ref 0.00–0.08)

## 2015-09-20 LAB — MRSA PCR SCREENING: MRSA by PCR: NEGATIVE

## 2015-09-20 LAB — TROPONIN I

## 2015-09-20 LAB — BRAIN NATRIURETIC PEPTIDE: B Natriuretic Peptide: 703.4 pg/mL — ABNORMAL HIGH (ref 0.0–100.0)

## 2015-09-20 MED ORDER — DONEPEZIL HCL 10 MG PO TABS
10.0000 mg | ORAL_TABLET | Freq: Every day | ORAL | Status: DC
  Administered 2015-09-20 – 2015-09-22 (×2): 10 mg via ORAL
  Filled 2015-09-20 (×3): qty 1

## 2015-09-20 MED ORDER — PRAVASTATIN SODIUM 20 MG PO TABS
20.0000 mg | ORAL_TABLET | Freq: Every day | ORAL | Status: DC
  Administered 2015-09-21 – 2015-09-23 (×3): 20 mg via ORAL
  Filled 2015-09-20 (×3): qty 1

## 2015-09-20 MED ORDER — DOCUSATE SODIUM 100 MG PO CAPS
100.0000 mg | ORAL_CAPSULE | Freq: Two times a day (BID) | ORAL | Status: DC
  Administered 2015-09-20 – 2015-09-23 (×5): 100 mg via ORAL
  Filled 2015-09-20 (×6): qty 1

## 2015-09-20 MED ORDER — ONDANSETRON HCL 4 MG/2ML IJ SOLN: 4.0000 mg | Freq: Four times a day (QID) | INTRAMUSCULAR | Status: DC | PRN

## 2015-09-20 MED ORDER — ALUM & MAG HYDROXIDE-SIMETH 200-200-20 MG/5ML PO SUSP: 30.0000 mL | Freq: Four times a day (QID) | ORAL | Status: DC | PRN

## 2015-09-20 MED ORDER — POLYSACCHARIDE IRON COMPLEX 150 MG PO CAPS
150.0000 mg | ORAL_CAPSULE | Freq: Two times a day (BID) | ORAL | Status: DC
  Administered 2015-09-20 – 2015-09-23 (×5): 150 mg via ORAL
  Filled 2015-09-20 (×8): qty 1

## 2015-09-20 MED ORDER — SODIUM CHLORIDE 0.9 % IV SOLN
INTRAVENOUS | Status: DC
  Administered 2015-09-22: 01:00:00 via INTRAVENOUS

## 2015-09-20 MED ORDER — CITALOPRAM HYDROBROMIDE 40 MG PO TABS
40.0000 mg | ORAL_TABLET | Freq: Every day | ORAL | Status: DC
  Administered 2015-09-21 – 2015-09-23 (×3): 40 mg via ORAL
  Filled 2015-09-20: qty 1
  Filled 2015-09-20: qty 2
  Filled 2015-09-20: qty 1

## 2015-09-20 MED ORDER — MEMANTINE HCL 10 MG PO TABS
20.0000 mg | ORAL_TABLET | Freq: Every day | ORAL | Status: DC
  Administered 2015-09-21 – 2015-09-23 (×3): 20 mg via ORAL
  Filled 2015-09-20 (×4): qty 2

## 2015-09-20 MED ORDER — ACETAMINOPHEN 325 MG PO TABS
650.0000 mg | ORAL_TABLET | Freq: Four times a day (QID) | ORAL | Status: DC
  Administered 2015-09-20 – 2015-09-23 (×9): 650 mg via ORAL
  Filled 2015-09-20 (×9): qty 2

## 2015-09-20 MED ORDER — ONDANSETRON HCL 4 MG PO TABS: 4.0000 mg | ORAL_TABLET | Freq: Four times a day (QID) | ORAL | Status: DC | PRN

## 2015-09-20 MED ORDER — METOPROLOL TARTRATE 25 MG PO TABS
25.0000 mg | ORAL_TABLET | Freq: Two times a day (BID) | ORAL | Status: DC
Start: 2015-09-20 — End: 2015-09-20

## 2015-09-20 MED ORDER — HEPARIN (PORCINE) IN NACL 100-0.45 UNIT/ML-% IJ SOLN
1200.0000 [IU]/h | INTRAMUSCULAR | Status: DC
  Administered 2015-09-20 – 2015-09-22 (×3): 1200 [IU]/h via INTRAVENOUS
  Filled 2015-09-20 (×3): qty 250

## 2015-09-20 MED ORDER — LEVOTHYROXINE SODIUM 75 MCG PO TABS
75.0000 ug | ORAL_TABLET | Freq: Every day | ORAL | Status: DC
  Administered 2015-09-21 – 2015-09-23 (×3): 75 ug via ORAL
  Filled 2015-09-20 (×3): qty 1

## 2015-09-20 MED ORDER — METOPROLOL TARTRATE 12.5 MG HALF TABLET
12.5000 mg | ORAL_TABLET | Freq: Two times a day (BID) | ORAL | Status: DC
  Administered 2015-09-20 – 2015-09-21 (×2): 12.5 mg via ORAL
  Filled 2015-09-20 (×2): qty 1

## 2015-09-20 MED ORDER — RISPERIDONE 0.25 MG PO TABS
0.2500 mg | ORAL_TABLET | Freq: Two times a day (BID) | ORAL | Status: DC
  Administered 2015-09-20 – 2015-09-23 (×5): 0.25 mg via ORAL
  Filled 2015-09-20 (×8): qty 1

## 2015-09-20 MED ORDER — ACETAMINOPHEN 650 MG RE SUPP: 650.0000 mg | Freq: Four times a day (QID) | RECTAL | Status: DC

## 2015-09-20 MED ORDER — SODIUM CHLORIDE 0.9 % IJ SOLN
3.0000 mL | Freq: Two times a day (BID) | INTRAMUSCULAR | Status: DC
  Administered 2015-09-22: 3 mL via INTRAVENOUS

## 2015-09-20 MED ORDER — HEPARIN SODIUM (PORCINE) 5000 UNIT/ML IJ SOLN
4000.0000 [IU] | Freq: Once | INTRAMUSCULAR | Status: AC
  Administered 2015-09-20: 4000 [IU] via INTRAVENOUS
  Filled 2015-09-20: qty 1

## 2015-09-20 MED ORDER — HYDRALAZINE HCL 20 MG/ML IJ SOLN
10.0000 mg | Freq: Four times a day (QID) | INTRAMUSCULAR | Status: DC | PRN
  Administered 2015-09-20 – 2015-09-23 (×5): 10 mg via INTRAVENOUS
  Filled 2015-09-20 (×5): qty 1

## 2015-09-20 NOTE — H&P (Signed)
[facility] History and Physical                                                                                    Heather Chan, is a 81 y.o. female  MRN: [id number]   DOB - 01/04/1930  Admit Date - 09/20/2015  Outpatient Primary MD for the patient is [facility]  Referring MD: Cyndie Chime / ER  With History of -  Past Medical History  Diagnosis Date  . Mental disorder   . DEMENTIA   . Hypothyroidism   . Arthritis   . Anxiety   . Depression   . Complication of anesthesia     hypotension post surgery  . Heart murmur   . Hypertension   . Stroke (HCC)   . Recurrent upper respiratory infection (URI)       Past Surgical History  Procedure Laterality Date  . Appendectomy    . Abdominal hysterectomy    . Back surgery    . Thyroidectomy      in for   Chief Complaint  Patient presents with  . Shortness of Breath     HPI This is an 81 year old female patient history of hypertension, Alzheimer's dementia with associated anxiety and depression, prior stroke, dyslipidemia, hypothyroidism and gait instability who presents from primary care physician's office after being diagnosed with bilateral saddle pulmonary emboli. Patient is unable to provide history secondary to the severity of her dementia so history obtained from daughter. Patient has had a one-week history of shortness of breath with focal left lower extremity edema. She has not started any new medications, she has not been on bed rest although she is minimally mobile at home with daughter noting "she sits a lot", she has not taken any long car trips. Outpatient CT angiogram of the chest was completed which did reveal saddle embolus in the main pulmonary artery with large emboli in the distal right main pulmonary artery extending into the right upper lobe and right lower lobe branches additional left upper lobe and left lower lobe small pulmonary emboli. Patient was sent to [facility] ER for  admission.  In ER patient was afebrile with with significant systolic hypertension with blood pressure 199/74, pulse regular at 65, room air saturations 86% prompting application of 2 L nasal cannula oxygen. Because of the known diagnosis labs were obtained and patient was started on heparin with pharmacy dosing. Laboratory data revealed mild acute kidney injury with a creatinine of 1.29 noting baseline creatinine dating back to 2012 was 0.8. BNP was 73, troponin was normal, a stat leukocytosis with a white count of 14,600 and hemoglobin 10.4, neutrophils 11.8, glucose 118. EKG revealed sinus rhythm with borderline right axis deviation but no ischemic changes.   Review of Systems   Unable to obtain review of systems from patient due to her underlying dementia. Patient's daughter denies any significant issues such as fevers chills, shortness of breath other than times the past 1 week, no reports of chest pain. Important to note that because of patient's dementia she has no recollection of any symptoms therefore would not report any symptoms.  Social History Social History  Substance  Use Topics  . Smoking status: Never Smoker   . Smokeless tobacco: Never Used  . Alcohol Use: No    Resides at: Private residence  Lives with: Lives alone: stays with daughter at her home during the day and returns to her home at night where a caregiver works with her from 4 PM to 8 PM. Patient does to bed at 8 PM and will not get out of bed until caregivers return in the morning.  Ambulatory status: Occasionally utilizes a cane when ambulating long distances-no history of falls   Family History The patient's underlying dementia unobtainable from patient   Prior to Admission medications   Medication Sig Start Date End Date Taking? Authorizing Provider  citalopram (CELEXA) 20 MG tablet Take 40 mg by mouth daily.   Yes Historical Provider, MD  donepezil (ARICEPT) 10 MG tablet Take 10 mg by mouth at bedtime.    Yes Historical Provider, MD  iron polysaccharides (NU-IRON) 150 MG capsule Take 150 mg by mouth 2 (two) times daily.   Yes Historical Provider, MD  levothyroxine (SYNTHROID, LEVOTHROID) 75 MCG tablet Take 75 mcg by mouth daily before breakfast.   Yes Historical Provider, MD  memantine (NAMENDA) 10 MG tablet Take 20 mg by mouth daily.   Yes Historical Provider, MD  metoprolol succinate (TOPROL-XL) 25 MG 24 hr tablet Take 12.5 mg by mouth daily.   Yes Historical Provider, MD  pravastatin (PRAVACHOL) 20 MG tablet Take 20 mg by mouth daily.   Yes Historical Provider, MD  risperiDONE (RISPERDAL) 0.25 MG tablet Take 0.25 mg by mouth 2 (two) times daily.   Yes Historical Provider, MD  citalopram (CELEXA) 20 MG tablet Take 1 tablet (20 mg total) by mouth daily. 10/15/11 10/14/12  Geoffry Paradiseichard Aronson, MD  donepezil (ARICEPT) 10 MG tablet Take 1 tablet (10 mg total) by mouth at bedtime. 10/11/11 10/10/12  Jarome Matinaniel Paterson, MD  levothyroxine (SYNTHROID) 75 MCG tablet Take 1 tablet (75 mcg total) by mouth daily. 10/11/11 10/10/12  Jarome Matinaniel Paterson, MD  metoprolol tartrate (LOPRESSOR) 25 MG tablet Take 1 tablet (25 mg total) by mouth 2 (two) times daily. 10/15/11 10/14/12  Geoffry Paradiseichard Aronson, MD    Allergies  Allergen Reactions  . Pneumovax [Pneumococcal Polysaccharide Vaccine] Other (See Comments)    Deathly sick  . Penicillins Other (See Comments)    unknown    Physical Exam  Vitals  Blood pressure 189/70, pulse 59, temperature 97.5 F (36.4 C), temperature source Oral, resp. rate 23, height 5' 3.75" (1.619 m), weight 164 lb (74.39 kg), SpO2 97 %.   General:  In no acute distress, appears stated age  Psych: Flat affect consistent with known diagnosis of dementia, obvious short term memory deficits during patient has no recollection of recent shortness of breath and denies any chest pain including with inspiration  Neuro:   No focal neurological deficits, CN II through XII intact, Strength 5/5 all 4  extremities, Sensation intact all 4 extremities.  ENT:  Ears and Eyes appear Normal, Conjunctivae clear, PER. Moist oral mucosa without erythema or exudates.  Neck:  Supple, No lymphadenopathy appreciated  Respiratory:  Symmetrical chest wall movement, Good air movement bilaterally, CTAB. Room Air  Cardiac:  RRR, No Murmurs, focal left LE edema noted evolving calf and extending to the ankle, no JVD, No carotid bruits, peripheral pulses palpable at 2+  Abdomen:  Positive bowel sounds, Soft, Non tender, Non distended,  No masses appreciated, no obvious hepatosplenomegaly  Skin:  No Cyanosis, Normal Skin Turgor, No  Skin Rash or Bruise.  Extremities: Symmetrical without obvious trauma or injury,  no effusions.  Data Review  CBC  Recent Labs Lab 09/20/15 1523  WBC 14.6*  HGB 10.4*  HCT 35.8*  PLT 280  MCV 78.5  MCH 22.8*  MCHC 29.1*  RDW 19.2*  LYMPHSABS 1.2  MONOABS 1.5*  EOSABS 0.1  BASOSABS 0.1    Chemistries   Recent Labs Lab 09/20/15 1523  NA 137  K 4.1  CL 103  CO2 22  GLUCOSE 118*  BUN 12  CREATININE 1.29*  CALCIUM 8.8*    estimated creatinine clearance is 32.4 mL/min (by C-G formula based on Cr of 1.29).  No results for input(s): TSH, T4TOTAL, T3FREE, THYROIDAB in the last 72 hours.  Invalid input(s): FREET3  Coagulation profile No results for input(s): INR, PROTIME in the last 168 hours.  No results for input(s): DDIMER in the last 72 hours.  Cardiac Enzymes No results for input(s): CKMB, TROPONINI, MYOGLOBIN in the last 168 hours.  Invalid input(s): CK  Invalid input(s): POCBNP  Urinalysis    Component Value Date/Time   COLORURINE YELLOW 10/13/2011 0530   APPEARANCEUR CLEAR 10/13/2011 0530   LABSPEC 1.013 10/13/2011 0530   PHURINE 6.5 10/13/2011 0530   GLUCOSEU NEGATIVE 10/13/2011 0530   HGBUR NEGATIVE 10/13/2011 0530   BILIRUBINUR NEGATIVE 10/13/2011 0530   KETONESUR 40* 10/13/2011 0530   PROTEINUR NEGATIVE 10/13/2011 0530    UROBILINOGEN 4.0* 10/13/2011 0530   NITRITE NEGATIVE 10/13/2011 0530   LEUKOCYTESUR NEGATIVE 10/13/2011 0530    Imaging results:   No results found.   EKG: (Independently reviewed) Sinus rhythm with borderline right axis deviation but no ischemic changes.   Assessment & Plan  Principal Problem:   Acute saddle pulmonary embolism (HCC) -Step down -EDP spoke with critical care/pulmonology MD on call-patient not appropriate candidate for lytic therapy and recommendation was for IV heparin infusion -Check echocardiogram -Check bilateral lower extremity duplex -Cycle troponin noting initial troponin normal -Supportive care -Suspect has likely pleuritic chest pain but given dementia unable to report; daughter reports patient is unable to relay any dermatology to staff therefore will begin scheduled Tylenol every 6 hours -Monitor for signs of RV strain such as hypotension and orthostasis  Active Problems:   Uncontrolled hypertension -Continue preadmission Lopressor    Acute respiratory failure with hypoxia (HCC) -Secondary to saddle embolism -Supportive care with oxygen -May require short-term oxygen at home    Alzheimer disease -Continue preadmission medications -Once medically stable recommend discharge to prevent worsening of Alzheimer symptoms    Acute renal failure (ARF) (HCC) -Baseline creatinine 0.8 and 2012 and now 1.29 -Suspect mild degree of volume depletion -With large bilateral PE increased risk for RV strain and despite hypertension will begin gentle IV fluid hydration with normal saline at 50 mL per hour    Hypothyroidism -Continue Synthroid    Hyperlipemia -Continue statin    DVT Prophylaxis: Full dose IV heparin  Family Communication:   Daughter at bedside  Code Status:  Limited code: No CPR no intubation no defibrillation or cardioversion  Condition:  Guarded  Discharge disposition: Anticipate discharge back to home environment with family assisting  pending PT evaluation  Time spent in minutes : 60      Rayelle Armor L. ANP on 09/20/2015 at 5:04 PM  Between 7am to 7pm - Pager - [phone]  After 7pm go to [email or web address] - password [email or web address]  And look for the night coverage person covering me after hours  [facility]

## 2015-09-20 NOTE — ED Notes (Signed)
MD at bedside. 

## 2015-09-20 NOTE — ED Notes (Signed)
Ordered dinner tray for pt.  

## 2015-09-20 NOTE — Progress Notes (Signed)
ANTICOAGULATION CONSULT NOTE - Initial Consult  Pharmacy Consult for heparin Indication: pulmonary embolus  Allergies  Allergen Reactions  . Pneumovax [Pneumococcal Polysaccharide Vaccine] Other (See Comments)    Deathly sick  . Penicillins Other (See Comments)    unknown    Patient Measurements: Height: 5' 3.75" (161.9 cm) Weight: 164 lb (74.39 kg) IBW/kg (Calculated) : 54.13 Heparin Dosing Weight: 69.7 kg  Vital Signs: Temp: 97.5 F (36.4 C) (11/22 1523) Temp Source: Oral (11/22 1523) BP: 199/74 mmHg (11/22 1545) Pulse Rate: 65 (11/22 1545)  Labs:  Recent Labs  09/20/15 1523  HGB 10.4*  HCT 35.8*  PLT 280    CrCl cannot be calculated (Patient has no serum creatinine result on file.).   Medical History: Past Medical History  Diagnosis Date  . Mental disorder   . DEMENTIA   . Hypothyroidism   . Arthritis   . Anxiety   . Depression   . Complication of anesthesia     hypotension post surgery  . Heart murmur   . Hypertension   . Stroke (HCC)   . Recurrent upper respiratory infection (URI)     Assessment: 81 yo f presenting to the ED with SOB x 1 week.  Pharmacy is consulted to dose heparin for a PE. Hgb 10.4, plts 280.  No AC PTA.   Goal of Therapy:  Heparin level 0.3-0.7 units/ml Monitor platelets by anticoagulation protocol: Yes   Plan:  Heparin 4,000 units x 1 Heparin infusion 1200 units/hr 8-hr HL @ 0030 Daily HL, CBC Monitor for s/sx of bleeding  Oliver Neuwirth L. Roseanne RenoStewart, PharmD Clinical Pharmacy Resident Pager: [phone] 09/20/2015 4:20 PM

## 2015-09-20 NOTE — ED Provider Notes (Signed)
CSN: [id number]     Arrival date & time 09/20/15  1513 History   First MD Initiated Contact with Patient 09/20/15 1528     Chief Complaint  Patient presents with  . Shortness of Breath     (Consider location/radiation/quality/duration/timing/severity/associated sxs/prior Treatment) HPI Comments: 81 y.o. Female with history of dementia presents for PE.  The patient's daughter at the bedside reports that the patient has appeared to be short of breath for at least 1 week if not longer and that it has been progressively worsening.  She states that she thought the patient may have pneumonia and took her to see her PCP who did outpatient work up to evaluate what was going on.  Today she underwent a CTA of her chest which showed saddle pulmonary embolism.  No known history of patient having easy bleeding or bruisning or intracranial bleeding, vomiting blood, or GI bleed.  Patient is not able to give any of her history secondary to her dementia.   Past Medical History  Diagnosis Date  . Mental disorder   . DEMENTIA   . Hypothyroidism   . Arthritis   . Anxiety   . Depression   . Complication of anesthesia     hypotension post surgery  . Heart murmur   . Hypertension   . Stroke (HCC)   . Recurrent upper respiratory infection (URI)    Past Surgical History  Procedure Laterality Date  . Appendectomy    . Abdominal hysterectomy    . Back surgery    . Thyroidectomy     No family history on file. Social History  Substance Use Topics  . Smoking status: Never Smoker   . Smokeless tobacco: Never Used  . Alcohol Use: No   OB History    No data available     Review of Systems  Unable to perform ROS: Dementia      Allergies  Pneumovax and Penicillins  Home Medications   Prior to Admission medications   Medication Sig Start Date End Date Taking? Authorizing Provider  citalopram (CELEXA) 20 MG tablet Take 40 mg by mouth daily.   Yes Historical Provider, MD  donepezil (ARICEPT)  10 MG tablet Take 10 mg by mouth at bedtime.   Yes Historical Provider, MD  iron polysaccharides (NU-IRON) 150 MG capsule Take 150 mg by mouth 2 (two) times daily.   Yes Historical Provider, MD  levothyroxine (SYNTHROID, LEVOTHROID) 75 MCG tablet Take 75 mcg by mouth daily before breakfast.   Yes Historical Provider, MD  memantine (NAMENDA) 10 MG tablet Take 20 mg by mouth daily.   Yes Historical Provider, MD  metoprolol succinate (TOPROL-XL) 25 MG 24 hr tablet Take 12.5 mg by mouth daily.   Yes Historical Provider, MD  pravastatin (PRAVACHOL) 20 MG tablet Take 20 mg by mouth daily.   Yes Historical Provider, MD  risperiDONE (RISPERDAL) 0.25 MG tablet Take 0.25 mg by mouth 2 (two) times daily.   Yes Historical Provider, MD  citalopram (CELEXA) 20 MG tablet Take 1 tablet (20 mg total) by mouth daily. 10/15/11 10/14/12  Geoffry Paradise, MD  donepezil (ARICEPT) 10 MG tablet Take 1 tablet (10 mg total) by mouth at bedtime. 10/11/11 10/10/12  Jarome Matin, MD  levothyroxine (SYNTHROID) 75 MCG tablet Take 1 tablet (75 mcg total) by mouth daily. 10/11/11 10/10/12  Jarome Matin, MD  metoprolol tartrate (LOPRESSOR) 25 MG tablet Take 1 tablet (25 mg total) by mouth 2 (two) times daily. 10/15/11 10/14/12  Geoffry Paradise, MD  BP 167/57 mmHg  Pulse 61  Temp(Src) 98.4 F (36.9 C) (Oral)  Resp 21  Ht  (1.626 m)  Wt 164 lb (74.39 kg)  BMI 28.14 kg/m2  SpO2 92% Physical Exam  Constitutional: She appears well-developed and well-nourished. No distress. Nasal cannula in place.  HENT:  Head: Normocephalic and atraumatic.  Right Ear: External ear normal.  Left Ear: External ear normal.  Nose: Nose normal.  Mouth/Throat: Oropharynx is clear and moist. No oropharyngeal exudate.  Eyes: EOM are normal. Pupils are equal, round, and reactive to light.  Neck: Normal range of motion. Neck supple.  Cardiovascular: Normal rate, regular rhythm, normal heart sounds and intact distal pulses.   No murmur  heard. Pulmonary/Chest: Effort normal. No respiratory distress. She has no wheezes. She has no rales.  Abdominal: Soft. She exhibits no distension. There is no tenderness.  Musculoskeletal: Normal range of motion. She exhibits no edema or tenderness.  Neurological: She is alert.  Oriented to self.  Able to answer simple questions.  Moving all extremities in a coordinated fashion  Skin: Skin is warm and dry. No rash noted. She is not diaphoretic.  Vitals reviewed.   ED Course  Procedures (including critical care time)  CRITICAL CARE Performed by: Larna Daughters   Total critical care time: 40 minutes  Critical care time was exclusive of separately billable procedures and treating other patients.  Critical care was necessary to treat or prevent imminent or life-threatening deterioration.  Critical care was time spent personally by me on the following activities: development of treatment plan with patient and/or surrogate as well as nursing, discussions with consultants, evaluation of patient's response to treatment, examination of patient, obtaining history from patient or surrogate, ordering and performing treatments and interventions, ordering and review of laboratory studies, ordering and review of radiographic studies, pulse oximetry and re-evaluation of patient's condition.  Labs Review Labs Reviewed  CBC WITH DIFFERENTIAL/PLATELET - Abnormal; Notable for the following:    WBC 14.6 (*)    Hemoglobin 10.4 (*)    HCT 35.8 (*)    MCH 22.8 (*)    MCHC 29.1 (*)    RDW 19.2 (*)    Neutro Abs 11.8 (*)    Monocytes Absolute 1.5 (*)    All other components within normal limits  BASIC METABOLIC PANEL - Abnormal; Notable for the following:    Glucose, Bld 118 (*)    Creatinine, Ser 1.29 (*)    Calcium 8.8 (*)    GFR calc non Af Amer 37 (*)    GFR calc Af Amer 43 (*)    All other components within normal limits  BRAIN NATRIURETIC PEPTIDE - Abnormal; Notable for the following:    B  Natriuretic Peptide 703.4 (*)    All other components within normal limits  CBC - Abnormal; Notable for the following:    Hemoglobin 9.3 (*)    HCT 32.0 (*)    MCH 22.7 (*)    MCHC 29.1 (*)    RDW 18.9 (*)    All other components within normal limits  MRSA PCR SCREENING  URINE CULTURE  HEPARIN LEVEL (UNFRACTIONATED)  TROPONIN I  URINALYSIS, ROUTINE W REFLEX MICROSCOPIC (NOT AT [facility])  HEPARIN LEVEL (UNFRACTIONATED)  TROPONIN I  BASIC METABOLIC PANEL  TROPONIN I  I-STAT TROPOININ, ED    Imaging Review No results found. I have personally reviewed and evaluated these images and lab results as part of my medical decision-making.   EKG Interpretation  Date/Time:  Tuesday September 20 2015 15:24:23 EST Ventricular Rate:  68 PR Interval:  140 QRS Duration: 94 QT Interval:  478 QTC Calculation: 508 R Axis:   82 Text Interpretation:  Sinus rhythm Borderline right axis deviation  Abnormal R-wave progression, early transition No significant change since  last tracing Confirmed by Neeya Prigmore ([id number]) on 09/20/2015 3:32:29 PM      MDM  Patient was seen and evaluated in stable condition.  Patient hypoxic on arrival and reportedly hypxic to 86% in PCP office.  CT angio of chest done at outpatient facility which we are able to see read of but images from disc sent over not able to be uploaded.  No stated right heart strain on CT read but ratio of LV:RV not given.  Hb at patient baseline.  Patient started on heparin.  Discussed case with critical care specialist on call who said at this time he would continue heparin, admit the patient to stepdown unit and have Echo completed inpatient for further right heart strain evaluation.  He did not recommend another CT at this time.  Discussed code status with patient's daughter who requested the patient be a no CPR which was placed in the chart.  Discussed case with NP on for [facility] who agreed with admission.  Patient was admitted under the care of  Dr. Isidoro Donningai. Final diagnoses:  Acute saddle pulmonary embolism with acute cor pulmonale (HCC)    1. Saddle Pulmonary embolism    Leta BaptistEmily Roe Leilah Polimeni, MD 09/21/15 [phone]

## 2015-09-20 NOTE — ED Notes (Signed)
Nurse unable to take report at this time.

## 2015-09-20 NOTE — ED Notes (Signed)
pts daughter reports patient has had sob for atleast one week. Pts was seen at her PCP and had a CTA done that shows a pulmonary embolus. Patient has ct result report and disk with her.

## 2015-09-21 ENCOUNTER — Ambulatory Visit (HOSPITAL_COMMUNITY): Payer: Medicare Other

## 2015-09-21 DIAGNOSIS — I2699 Other pulmonary embolism without acute cor pulmonale: Secondary | ICD-10-CM

## 2015-09-21 DIAGNOSIS — I10 Essential (primary) hypertension: Secondary | ICD-10-CM

## 2015-09-21 DIAGNOSIS — I2692 Saddle embolus of pulmonary artery without acute cor pulmonale: Secondary | ICD-10-CM

## 2015-09-21 LAB — BASIC METABOLIC PANEL
ANION GAP: 9 (ref 5–15)
BUN: 17 mg/dL (ref 6–20)
CALCIUM: 8.6 mg/dL — AB (ref 8.9–10.3)
CO2: 25 mmol/L (ref 22–32)
Chloride: 104 mmol/L (ref 101–111)
Creatinine, Ser: 1.21 mg/dL — ABNORMAL HIGH (ref 0.44–1.00)
GFR, EST AFRICAN AMERICAN: 47 mL/min — AB (ref >60)
GFR, EST NON AFRICAN AMERICAN: 40 mL/min — AB (ref >60)
Glucose, Bld: 107 mg/dL — ABNORMAL HIGH (ref 65–99)
Potassium: 3.5 mmol/L (ref 3.5–5.1)
Sodium: 138 mmol/L (ref 135–145)

## 2015-09-21 LAB — CBC
HCT: 32 % — ABNORMAL LOW (ref 36.0–46.0)
Hemoglobin: 9.3 g/dL — ABNORMAL LOW (ref 12.0–15.0)
MCH: 22.7 pg — ABNORMAL LOW (ref 26.0–34.0)
MCHC: 29.1 g/dL — ABNORMAL LOW (ref 30.0–36.0)
MCV: 78 fL (ref 78.0–100.0)
PLATELETS: 231 10*3/uL (ref 150–400)
RBC: 4.1 MIL/uL (ref 3.87–5.11)
RDW: 18.9 % — ABNORMAL HIGH (ref 11.5–15.5)
WBC: 8.8 10*3/uL (ref 4.0–10.5)

## 2015-09-21 LAB — URINALYSIS, ROUTINE W REFLEX MICROSCOPIC
BILIRUBIN URINE: NEGATIVE
GLUCOSE, UA: NEGATIVE mg/dL
HGB URINE DIPSTICK: NEGATIVE
Ketones, ur: NEGATIVE mg/dL
Leukocytes, UA: NEGATIVE
Nitrite: POSITIVE — AB
PH: 6 (ref 5.0–8.0)
Protein, ur: NEGATIVE mg/dL
SPECIFIC GRAVITY, URINE: 1.012 (ref 1.005–1.030)

## 2015-09-21 LAB — URINE MICROSCOPIC-ADD ON

## 2015-09-21 LAB — TROPONIN I

## 2015-09-21 LAB — HEPARIN LEVEL (UNFRACTIONATED)
Heparin Unfractionated: 0.3 IU/mL (ref 0.30–0.70)
Heparin Unfractionated: 0.36 IU/mL (ref 0.30–0.70)

## 2015-09-21 MED ORDER — DEXTROSE 5 % IV SOLN
1.0000 g | INTRAVENOUS | Status: DC
  Administered 2015-09-21 – 2015-09-23 (×3): 1 g via INTRAVENOUS
  Filled 2015-09-21 (×3): qty 10

## 2015-09-21 MED ORDER — METOPROLOL SUCCINATE ER 25 MG PO TB24
12.5000 mg | ORAL_TABLET | Freq: Every day | ORAL | Status: DC
  Administered 2015-09-21 – 2015-09-23 (×3): 12.5 mg via ORAL
  Filled 2015-09-21 (×3): qty 1

## 2015-09-21 MED ORDER — CETYLPYRIDINIUM CHLORIDE 0.05 % MT LIQD
7.0000 mL | Freq: Two times a day (BID) | OROMUCOSAL | Status: DC
  Administered 2015-09-21 – 2015-09-23 (×4): 7 mL via OROMUCOSAL

## 2015-09-21 NOTE — Progress Notes (Addendum)
[facility] PROGRESS NOTE    Progress Note   Heather Chan JYN:[id number] DOB: [birth year] DOA: 09/20/2015 [facility]   Brief Narrative:   Heather MohsBetty Chan is an 81 y.o. female  with history of hypertension, dementia, anxiety, hypothyroidism, dyslipidemia presented from PCPs office with acute PE, on IV heparin.  Assessment/Plan:  Acute saddle pulmonary embolism (HCC)/  Acute respiratory failure with hypoxia (HCC): - Started on IV heparin. - Echo, duplex are pending. - Cardiac biomarkers are negative. - Transfer to regular floor.  Possible UTI: Foul smelling urine. Check U/a start Rocephin, there is less than 5% cross reactivity with PCN.  Hypothyroidism: - cont synthroid.  Alzheimer disease - cont current meds. - haldol PRN.  Hyperlipemia: Statins.  Uncontrolled hypertension Cont lopressor.  Acute renal failure (ARF) (HCC) - baseline around 0.8. Cont slow IV fluid hydration.- - cr still elevated today.    DVT Prophylaxis - Lovenox ordered.  Family Communication: none Disposition Plan: Home in 1-2 days Code Status:     Code Status Orders        Start     Ordered   09/20/15 1832  Limited resuscitation (code)   Continuous    Question Answer Comment  In the event of cardiac or respiratory ARREST: Initiate Code Blue, Call Rapid Response Yes   In the event of cardiac or respiratory ARREST: Perform CPR No   In the event of cardiac or respiratory ARREST: Perform Intubation/Mechanical Ventilation No   In the event of cardiac or respiratory ARREST: Use NIPPV/BiPAp only if indicated Yes   In the event of cardiac or respiratory ARREST: Administer ACLS medications if indicated Yes   In the event of cardiac or respiratory ARREST: Perform Defibrillation or Cardioversion if indicated No      09/20/15 1832    Advance Directive Documentation        Most Recent Value   Type of Advance Directive  Healthcare Power of Attorney, Living will   Pre-existing out of facility DNR order (yellow form or pink MOST form)     "MOST" Form in Place?          IV Access:    Peripheral IV   Procedures and diagnostic studies:   No results found.   Medical Consultants:    None.  Anti-Infectives:   Anti-infectives    None      Subjective:    Heather Chan she has no complain she is incontinent. Foul smelling urine.  Objective:    Filed Vitals:   09/20/15 2000 09/20/15 2219 09/21/15 0009 09/21/15 0411  BP: 170/62 148/52 131/50 167/57  Pulse:   60 61  Temp:   98.5 F (36.9 C) 98.4 F (36.9 C)  TempSrc:   Oral Oral  Resp:   19 21  Height:      Weight:      SpO2:   92%     Intake/Output Summary (Last 24 hours) at 09/21/15 0724 Last data filed at 09/21/15 0600  Gross per 24 hour  Intake    244 ml  Output      0 ml  Net    244 ml   Filed Weights   09/20/15 1528  Weight: 74.39 kg (164 lb)    Exam: Gen:  NAD Cardiovascular:  RRR, No M/R/G Chest and lungs:   CTAB Abdomen:  Abdomen soft, NT/ND, + BS Extremities:  No lower ext edema.   Data Reviewed:    Labs: Basic Metabolic Panel:  Recent Labs Lab  09/20/15 1523 09/21/15 0341  NA 137 138  K 4.1 3.5  CL 103 104  CO2 22 25  GLUCOSE 118* 107*  BUN 12 17  CREATININE 1.29* 1.21*  CALCIUM 8.8* 8.6*   GFR Estimated Creatinine Clearance: 34.8 mL/min (by C-G formula based on Cr of 1.21). Liver Function Tests: No results for input(s): AST, ALT, ALKPHOS, BILITOT, PROT, ALBUMIN in the last 168 hours. No results for input(s): LIPASE, AMYLASE in the last 168 hours. No results for input(s): AMMONIA in the last 168 hours. Coagulation profile No results for input(s): INR, PROTIME in the last 168 hours.  CBC:  Recent Labs Lab 09/20/15 1523 09/21/15 0341  WBC 14.6* 8.8  NEUTROABS 11.8*  --   HGB 10.4* 9.3*  HCT 35.8* 32.0*  MCV 78.5 78.0  PLT 280 231   Cardiac Enzymes:  Recent Labs Lab 09/20/15 2147 09/21/15 0341  TROPONINI <0.03 <0.03     BNP (last 3 results) No results for input(s): PROBNP in the last 8760 hours. CBG: No results for input(s): GLUCAP in the last 168 hours. D-Dimer: No results for input(s): DDIMER in the last 72 hours. Hgb A1c: No results for input(s): HGBA1C in the last 72 hours. Lipid Profile: No results for input(s): CHOL, HDL, LDLCALC, TRIG, CHOLHDL, LDLDIRECT in the last 72 hours. Thyroid function studies: No results for input(s): TSH, T4TOTAL, T3FREE, THYROIDAB in the last 72 hours.  Invalid input(s): FREET3 Anemia work up: No results for input(s): VITAMINB12, FOLATE, FERRITIN, TIBC, IRON, RETICCTPCT in the last 72 hours. Sepsis Labs:  Recent Labs Lab 09/20/15 1523 09/21/15 0341  WBC 14.6* 8.8   Microbiology Recent Results (from the past 240 hour(s))  MRSA PCR Screening     Status: None   Collection Time: 09/20/15  6:00 PM  Result Value Ref Range Status   MRSA by PCR NEGATIVE NEGATIVE Final    Comment:        The GeneXpert MRSA Assay (FDA approved for NASAL specimens only), is one component of a comprehensive MRSA colonization surveillance program. It is not intended to diagnose MRSA infection nor to guide or monitor treatment for MRSA infections.      Medications:   . acetaminophen  650 mg Oral Q6H   Or  . acetaminophen  650 mg Rectal Q6H  . citalopram  40 mg Oral Daily  . docusate sodium  100 mg Oral BID  . donepezil  10 mg Oral QHS  . iron polysaccharides  150 mg Oral BID  . levothyroxine  75 mcg Oral QAC breakfast  . memantine  20 mg Oral Daily  . metoprolol tartrate  12.5 mg Oral BID  . pravastatin  20 mg Oral Daily  . risperiDONE  0.25 mg Oral BID  . sodium chloride  3 mL Intravenous Q12H   Continuous Infusions: . sodium chloride    . heparin 1,200 Units/hr (09/20/15 1700)    Time spent: 25 min   LOS: 1 day   Marinda Elk  [facility] Pager [phone]  *Please refer to [email or web address], password [email or web address] to get updated schedule on who will round  on this patient, as hospitalists switch teams weekly. If 7PM-7AM, please contact night-coverage at [email or web address], password [email or web address] for any overnight needs.  09/21/2015, 7:24 AM

## 2015-09-21 NOTE — Progress Notes (Signed)
ANTICOAGULATION CONSULT NOTE - Follow Up Consult  Pharmacy Consult for Heparin  Indication: pulmonary embolus  Allergies  Allergen Reactions  . Pneumovax [Pneumococcal Polysaccharide Vaccine] Other (See Comments)    Deathly sick  . Penicillins Other (See Comments)    unknown   Patient Measurements: Height: 5\' 4"  (162.6 cm) Weight: 164 lb (74.39 kg) IBW/kg (Calculated) : 54.7  Vital Signs: Temp: 98.5 F (36.9 C) (11/23 0009) Temp Source: Oral (11/23 0009) BP: 131/50 mmHg (11/23 0009) Pulse Rate: 60 (11/23 0009)  Labs:  Recent Labs  09/20/15 1523 09/20/15 2147 09/21/15 0019  HGB 10.4*  --   --   HCT 35.8*  --   --   PLT 280  --   --   HEPARINUNFRC  --   --  0.36  CREATININE 1.29*  --   --   TROPONINI  --  <0.03  --     Estimated Creatinine Clearance: 32.7 mL/min (by C-G formula based on Cr of 1.29).  Assessment: Heparin for new onset PE and possibly DVT given leg swelling, initial heparin level is therapeutic at 0.36, Hgb 10.4, other labs reviewed.   Goal of Therapy:  Heparin level 0.3-0.7 units/ml Monitor platelets by anticoagulation protocol: Yes   Plan:  -Continue heparin at 1200 units/hr -Confirmatory HL with AM labs -Daily CBC/HL -Monitor for bleeding  Heather Chan, Heather Chan 09/21/2015,1:02 AM

## 2015-09-21 NOTE — Progress Notes (Signed)
  Echocardiogram 2D Echocardiogram has been performed.  Arvil ChacoFoster, Ezma Rehm 09/21/2015, 3:20 PM

## 2015-09-21 NOTE — Evaluation (Signed)
Occupational Therapy Evaluation Patient Details Name: Heather Chan MRN: [id number] DOB: 19-Jun-1930 Today's Date: 09/21/2015    History of Present Illness Heather MohsBetty Chan is a 81 y.o. female with history of hypertension, dementia, anxiety, hypothyroidism, dyslipidemia presented from PCPs office with acute PE.   Clinical Impression   This 81 yo female admitted with above presents to acute OT with decreased balance, decreased mobility, decrease in O2 sats at rest on RA all affecting her PLOF of S for mobility and prn A for basic ADLs. She will benefit from acute OT with follow up HHOT to get back to a S level.    Follow Up Recommendations  Home health OT    Equipment Recommendations  None recommended by OT       Precautions / Restrictions Precautions Precautions: Fall Precaution Comments: monitor  O2 (on RA 86%--O2 was off when I arrived to room). Reapplied at 4liters and up to 98% Restrictions Weight Bearing Restrictions: No      Mobility Bed Mobility Overal bed mobility: Needs Assistance Bed Mobility: Supine to Sit     Supine to sit: Supervision;HOB elevated (and use of rail)        Transfers Overall transfer level: Needs assistance Equipment used: 1 person hand held assist Transfers: Sit to/from UGI CorporationStand;Stand Pivot Transfers Sit to Stand: Min assist Stand pivot transfers: Mod assist            Balance Overall balance assessment: Needs assistance Sitting-balance support: No upper extremity supported;Feet supported Sitting balance-Leahy Scale: Good (donning socks EOB by crossing legs )     Standing balance support: Single extremity supported Standing balance-Leahy Scale: Poor                              ADL Overall ADL's : Needs assistance/impaired Eating/Feeding: Independent;Sitting   Grooming: Set up;Sitting   Upper Body Bathing: Set up;Sitting   Lower Body Bathing: Supervison/ safety;Set up (min A sit<>stand, Mod A for balance during  functional activity)   Upper Body Dressing : Set up;Sitting   Lower Body Dressing: Supervision/safety;Set up (min A sit<>stand, Mod A for balance during functional activity)   Toilet Transfer: Moderate assistance;Stand-pivot (bed>W/C)   Toileting- Clothing Manipulation and Hygiene: Moderate assistance (min A sit<>stand, Mod A for balance during functional activity)                         Pertinent Vitals/Pain Pain Assessment: No/denies pain     Hand Dominance Right   Extremity/Trunk Assessment Upper Extremity Assessment Upper Extremity Assessment: Overall WFL for tasks assessed   Lower Extremity Assessment Lower Extremity Assessment: Defer to PT evaluation       Communication Communication Communication: No difficulties   Cognition Arousal/Alertness: Awake/alert Behavior During Therapy: WFL for tasks assessed/performed Overall Cognitive Status: History of cognitive impairments - at baseline                                Home Living Family/patient expects to be discharged to:: Private residence Living Arrangements: Children Available Help at Discharge: Family;Available 24 hours/day Type of Home: House Home Access: Stairs to enter Entergy CorporationEntrance Stairs-Number of Steps: 3 Entrance Stairs-Rails: Right (going up) Home Layout: One level     Bathroom Shower/Tub: Producer, television/film/videoWalk-in shower   Bathroom Toilet: Standard     Home Equipment: Environmental consultantWalker - 2 wheels;Walker - 4 wheels;Shower seat;Bedside commode;Cane - single  point   Additional Comments: dtr reports pt will be coming to her house full time when first D/C'd      Prior Functioning/Environment Level of Independence: Needs assistance  Gait / Transfers Assistance Needed: intermittently used AD for ambulation ADL's / Homemaking Assistance Needed: prn A (A for in and out of shower)        OT Diagnosis: Generalized weakness   OT Problem List: Decreased strength;Decreased activity tolerance;Cardiopulmonary  status limiting activity;Impaired balance (sitting and/or standing)   OT Treatment/Interventions: Self-care/ADL training;Balance training;Patient/family education;DME and/or AE instruction    OT Goals(Current goals can be found in the care plan section) Acute Rehab OT Goals Patient Stated Goal: go home OT Goal Formulation: With patient Time For Goal Achievement: 09/28/15 Potential to Achieve Goals: Good  OT Frequency: Min 2X/week              End of Session Equipment Utilized During Treatment: Oxygen (4 liters) Nurse Communication:  (nurse and NT in room when I was transferring her)  Activity Tolerance: Patient tolerated treatment well Patient left:  (in W/C with NT getting ready to transfer her)   Time: 1610-9604 OT Time Calculation (min): 24 min Charges:  OT General Charges $OT Visit: 1 Procedure OT Evaluation $Initial OT Evaluation Tier I: 1 Procedure OT Treatments $Self Care/Home Management : 8-22 mins  Evette Georges [phone] 09/21/2015, 9:56 AM

## 2015-09-21 NOTE — Progress Notes (Signed)
ANTICOAGULATION CONSULT NOTE - Follow Up Consult  Pharmacy Consult for Heparin  Indication: pulmonary embolus  Allergies  Allergen Reactions  . Pneumovax [Pneumococcal Polysaccharide Vaccine] Other (See Comments)    Deathly sick  . Penicillins Other (See Comments)    unknown   Patient Measurements: Height: 5\' 5"  (165.1 cm) Weight: 163 lb 2.3 oz (74 kg) IBW/kg (Calculated) : 57  Vital Signs: Temp: 97.7 F (36.5 C) (11/23 1012) Temp Source: Oral (11/23 1012) BP: 154/49 mmHg (11/23 1012) Pulse Rate: 58 (11/23 1012)  Labs:  Recent Labs  09/20/15 1523 09/20/15 2147 09/21/15 0019 09/21/15 0341 09/21/15 0755  HGB 10.4*  --   --  9.3*  --   HCT 35.8*  --   --  32.0*  --   PLT 280  --   --  231  --   HEPARINUNFRC  --   --  0.36  --  0.30  CREATININE 1.29*  --   --  1.21*  --   TROPONINI  --  <0.03  --  <0.03  --     Estimated Creatinine Clearance: 35.5 mL/min (by C-G formula based on Cr of 1.21).  Assessment: Heparin for new onset PE and possibly DVT given leg swelling. Heparin level remains therapeutic at 0.3, H/H is low but stable and no bleeding noted.   Goal of Therapy:  Heparin level 0.3-0.7 units/ml Monitor platelets by anticoagulation protocol: Yes   Plan:  -Continue heparin at 1200 units/hr -Daily CBC/HL -Monitor for bleeding  Nycole Kawahara, Drake Leachachel Lynn 09/21/2015,11:06 AM

## 2015-09-21 NOTE — Evaluation (Signed)
Physical Therapy Evaluation Patient Details Name: Heather Chan MRN: [id number] DOB: Jun 25, 1930 Today's Date: 09/21/2015   History of Present Illness  Heather Chan is a 81 y.o. female with history of hypertension, dementia, anxiety, hypothyroidism, dyslipidemia presented from PCPs office with acute PE.  Clinical Impression  Pt admitted with above. Pt has 24/7 assist due to known dementia. Pt was functioning at supervision level and used cane to ambulate however pt now with SOB with mobility, decreased activity tolerance,  Requires minA for transfers and ambulation and use of RW for safe ambulation. Acute PT to con't to follow to progress mobility as able.    Follow Up Recommendations Home health PT;Supervision/Assistance - 24 hour    Equipment Recommendations  None recommended by PT    Recommendations for Other Services       Precautions / Restrictions Precautions Precautions: Fall Precaution Comments: monitor  O2 (on RA 86%--O2 was off when I arrived to room). Reapplied at 4liters and up to 98% Restrictions Weight Bearing Restrictions: No      Mobility  Bed Mobility Overal bed mobility: Needs Assistance Bed Mobility: Supine to Sit     Supine to sit: HOB elevated;Min guard (and use of rail)     General bed mobility comments: directional v/c's to complete task  Transfers Overall transfer level: Needs assistance Equipment used: 1 person hand held assist Transfers: Sit to/from Stand Sit to Stand: Min assist         General transfer comment: minA to power up, v/c's for safe hand placement  Ambulation/Gait Ambulation/Gait assistance: Min assist Ambulation Distance (Feet): 10 Feet Assistive device: Rolling walker (2 wheeled) Gait Pattern/deviations: Step-through pattern;Decreased stride length;Shuffle Gait velocity: decreased Gait velocity interpretation: Below normal speed for age/gender General Gait Details: pt unfamilar with walker requring minA for safe walker  mangement to minimize pushing to far out in front, + SOB, pt on 2Lo2 via [place]  Stairs            Wheelchair Mobility    Modified Rankin (Stroke Patients Only)       Balance Overall balance assessment: Needs assistance Sitting-balance support: No upper extremity supported Sitting balance-Leahy Scale: Good (donning socks EOB by crossing legs )     Standing balance support: Bilateral upper extremity supported Standing balance-Leahy Scale: Poor                               Pertinent Vitals/Pain      Home Living Family/patient expects to be discharged to:: Private residence Living Arrangements: Children Available Help at Discharge: Family;Available 24 hours/day Type of Home: House Home Access: Stairs to enter Entrance Stairs-Rails: Right (going up) Entrance Stairs-Number of Steps: 3-4 Home Layout: One level Home Equipment: Walker - 2 wheels;Walker - 4 wheels;Shower seat;Bedside commode;Cane - single point Additional Comments: dtr reports pt will be coming to her house full time when first D/C'd    Prior Function Level of Independence: Needs assistance   Gait / Transfers Assistance Needed: intermittently used AD for ambulation  ADL's / Homemaking Assistance Needed: prn A (A for in and out of shower)        Hand Dominance   Dominant Hand: Right    Extremity/Trunk Assessment   Upper Extremity Assessment: Generalized weakness           Lower Extremity Assessment: Generalized weakness      Cervical / Trunk Assessment: Normal  Communication   Communication: No difficulties  Cognition  Arousal/Alertness: Awake/alert Behavior During Therapy: WFL for tasks assessed/performed Overall Cognitive Status: History of cognitive impairments - at baseline                      General Comments      Exercises        Assessment/Plan    PT Assessment Patient needs continued PT services  PT Diagnosis Difficulty walking;Generalized weakness    PT Problem List Decreased strength;Decreased activity tolerance;Decreased balance;Decreased mobility  PT Treatment Interventions DME instruction;Gait training;Stair training;Functional mobility training;Therapeutic activities;Therapeutic exercise   PT Goals (Current goals can be found in the Care Plan section) Acute Rehab PT Goals Patient Stated Goal: go home PT Goal Formulation: With family Time For Goal Achievement: 10/05/15 Potential to Achieve Goals: Good    Frequency Min 3X/week   Barriers to discharge        Co-evaluation               End of Session Equipment Utilized During Treatment: Gait belt Activity Tolerance: Patient limited by fatigue Patient left: in chair;with call bell/phone within reach;with family/visitor present Nurse Communication: Mobility status         Time: 1500-1531 PT Time Calculation (min) (ACUTE ONLY): 31 min   Charges:   PT Evaluation $Initial PT Evaluation Tier I: 1 Procedure PT Treatments $Gait Training: 8-22 mins   PT G CodesMarcene Brawn 09/21/2015, 3:36 PM   Lewis Shock, PT, DPT Pager #: [phone] Office #: [phone]

## 2015-09-21 NOTE — Progress Notes (Signed)
Preliminary results by tech - Bilateral Lower Ext. Venous Duplex Completed. Positive for deep vein thrombosis involving the left femoral vein, popliteal vein and posterior tibial and tibial veins.  No evidence of DVT in the right leg. Results given to Summit Asc LLPJennifer, patient's nurse. Marilynne Halstedita Tequita Marrs, BS, RDMS, RVT

## 2015-09-21 NOTE — Progress Notes (Signed)
Utilization Review Completed.Heather Chan T11/23/2016  

## 2015-09-22 LAB — CBC
HEMATOCRIT: 31 % — AB (ref 36.0–46.0)
HEMOGLOBIN: 9 g/dL — AB (ref 12.0–15.0)
MCH: 22.7 pg — AB (ref 26.0–34.0)
MCHC: 29 g/dL — AB (ref 30.0–36.0)
MCV: 78.3 fL (ref 78.0–100.0)
Platelets: 254 10*3/uL (ref 150–400)
RBC: 3.96 MIL/uL (ref 3.87–5.11)
RDW: 19.1 % — AB (ref 11.5–15.5)
WBC: 8.9 10*3/uL (ref 4.0–10.5)

## 2015-09-22 LAB — HEPARIN LEVEL (UNFRACTIONATED): HEPARIN UNFRACTIONATED: 0.34 [IU]/mL (ref 0.30–0.70)

## 2015-09-22 MED ORDER — HYDRALAZINE HCL 20 MG/ML IJ SOLN
10.0000 mg | INTRAMUSCULAR | Status: AC
  Administered 2015-09-22: 10 mg via INTRAVENOUS
  Filled 2015-09-22: qty 1

## 2015-09-22 NOTE — Progress Notes (Signed)
Paged MD about BP of 197/68. Orders given for 10 mg Hydralazine. Will carry out order at this time.  Delrae SawyersKristen A Johnatan Baskette, RN

## 2015-09-22 NOTE — Progress Notes (Signed)
Pt is refusing all PO medications at this time.

## 2015-09-22 NOTE — Progress Notes (Signed)
[facility] PROGRESS NOTE    Progress Note   Heather Chan ZOX:[id number] DOB: 22-Feb-1930 DOA: 09/20/2015 [facility]   Brief Narrative:   Heather Chan is an 81 y.o. female  with history of hypertension, dementia, anxiety, hypothyroidism, dyslipidemia presented from PCPs office with acute PE, on IV heparin.  Assessment/Plan:  Acute saddle pulmonary embolism (HCC)/  Acute respiratory failure with hypoxia (HCC): - Started on IV heparin. - Echo no RV dilation, duplex showed acute deep vein thrombosis involving the  left femoral vein, left popliteal vein, left posterial tibial vein, and left peroneal vein. - will transition her to Eluquis in am.  Possible UTI: Foul smelling urine. Check U/a start Rocephin, urine cultures pending.  Hypothyroidism: - cont synthroid.  Alzheimer disease - Cont current meds. Cont Aricept. - Haldol PRN.  Hyperlipemia: Statins.  Uncontrolled hypertension Cont lopressor.  Acute renal failure (ARF) (HCC) - Baseline around 0.8. Cont slow IV fluid hydration. - Cr still elevated today.  Normocytic anemia: Follow up with PCP as an outpatient.   DVT Prophylaxis - Lovenox ordered.  Family Communication: daughter Disposition Plan: Home in am Code Status:     Code Status Orders        Start     Ordered   09/20/15 1832  Limited resuscitation (code)   Continuous    Question Answer Comment  In the event of cardiac or respiratory ARREST: Initiate Code Blue, Call Rapid Response Yes   In the event of cardiac or respiratory ARREST: Perform CPR No   In the event of cardiac or respiratory ARREST: Perform Intubation/Mechanical Ventilation No   In the event of cardiac or respiratory ARREST: Use NIPPV/BiPAp only if indicated Yes   In the event of cardiac or respiratory ARREST: Administer ACLS medications if indicated Yes   In the event of cardiac or respiratory ARREST: Perform Defibrillation or Cardioversion if indicated No       09/20/15 1832    Advance Directive Documentation        Most Recent Value   Type of Advance Directive  Healthcare Power of Attorney, Living will   Pre-existing out of facility DNR order (yellow form or pink MOST form)     "MOST" Form in Place?          IV Access:    Peripheral IV   Procedures and diagnostic studies:   No results found.   Medical Consultants:    None.  Anti-Infectives:   Anti-infectives    Start     Dose/Rate Route Frequency Ordered Stop   09/21/15 0800  cefTRIAXone (ROCEPHIN) 1 g in dextrose 5 % 50 mL IVPB     1 g 100 mL/hr over 30 Minutes Intravenous Every 24 hours 09/21/15 0745        Subjective:    Heather Chan no complains  Objective:    Filed Vitals:   09/22/15 0219 09/22/15 0649 09/22/15 1102 09/22/15 1159  BP: 185/65 197/68 177/60 153/59  Pulse:   73 73  Temp:      TempSrc:      Resp:      Height:      Weight:      SpO2:    94%    Intake/Output Summary (Last 24 hours) at 09/22/15 1300 Last data filed at 09/22/15 1158  Gross per 24 hour  Intake    950 ml  Output    200 ml  Net    750 ml   [employer]  09/20/15 1528 09/21/15 1012  Weight: 74.39 kg (164 lb) 74 kg (163 lb 2.3 oz)    Exam: Gen:  NAD Cardiovascular:  RRR, No M/R/G Chest and lungs:   CTAB Abdomen:  Abdomen soft, NT/ND, + BS Extremities:  No lower ext edema.   Data Reviewed:    Labs: Basic Metabolic Panel:  Recent Labs Lab 09/20/15 1523 09/21/15 0341  NA 137 138  K 4.1 3.5  CL 103 104  CO2 22 25  GLUCOSE 118* 107*  BUN 12 17  CREATININE 1.29* 1.21*  CALCIUM 8.8* 8.6*   GFR Estimated Creatinine Clearance: 35.5 mL/min (by C-G formula based on Cr of 1.21). Liver Function Tests: No results for input(s): AST, ALT, ALKPHOS, BILITOT, PROT, ALBUMIN in the last 168 hours. No results for input(s): LIPASE, AMYLASE in the last 168 hours. No results for input(s): AMMONIA in the last 168 hours. Coagulation profile No results for  input(s): INR, PROTIME in the last 168 hours.  CBC:  Recent Labs Lab 09/20/15 1523 09/21/15 0341 09/22/15 0304  WBC 14.6* 8.8 8.9  NEUTROABS 11.8*  --   --   HGB 10.4* 9.3* 9.0*  HCT 35.8* 32.0* 31.0*  MCV 78.5 78.0 78.3  PLT 280 231 254   Cardiac Enzymes:  Recent Labs Lab 09/20/15 2147 09/21/15 0341  TROPONINI <0.03 <0.03   BNP (last 3 results) No results for input(s): PROBNP in the last 8760 hours. CBG: No results for input(s): GLUCAP in the last 168 hours. D-Dimer: No results for input(s): DDIMER in the last 72 hours. Hgb A1c: No results for input(s): HGBA1C in the last 72 hours. Lipid Profile: No results for input(s): CHOL, HDL, LDLCALC, TRIG, CHOLHDL, LDLDIRECT in the last 72 hours. Thyroid function studies: No results for input(s): TSH, T4TOTAL, T3FREE, THYROIDAB in the last 72 hours.  Invalid input(s): FREET3 Anemia work up: No results for input(s): VITAMINB12, FOLATE, FERRITIN, TIBC, IRON, RETICCTPCT in the last 72 hours. Sepsis Labs:  Recent Labs Lab 09/20/15 1523 09/21/15 0341 09/22/15 0304  WBC 14.6* 8.8 8.9   Microbiology Recent Results (from the past 240 hour(s))  MRSA PCR Screening     Status: None   Collection Time: 09/20/15  6:00 PM  Result Value Ref Range Status   MRSA by PCR NEGATIVE NEGATIVE Final    Comment:        The GeneXpert MRSA Assay (FDA approved for NASAL specimens only), is one component of a comprehensive MRSA colonization surveillance program. It is not intended to diagnose MRSA infection nor to guide or monitor treatment for MRSA infections.   Urine culture     Status: None (Preliminary result)   Collection Time: 09/21/15  7:47 AM  Result Value Ref Range Status   Specimen Description URINE, RANDOM  Final   Special Requests NONE  Final   Culture >=100,000 COLONIES/mL ESCHERICHIA COLI  Final   Report Status PENDING  Incomplete     Medications:   . acetaminophen  650 mg Oral Q6H   Or  . acetaminophen  650 mg  Rectal Q6H  . antiseptic oral rinse  7 mL Mouth Rinse BID  . cefTRIAXone (ROCEPHIN)  IV  1 g Intravenous Q24H  . citalopram  40 mg Oral Daily  . docusate sodium  100 mg Oral BID  . donepezil  10 mg Oral QHS  . iron polysaccharides  150 mg Oral BID  . levothyroxine  75 mcg Oral QAC breakfast  . memantine  20 mg Oral Daily  . metoprolol  succinate  12.5 mg Oral Daily  . pravastatin  20 mg Oral Daily  . risperiDONE  0.25 mg Oral BID  . sodium chloride  3 mL Intravenous Q12H   Continuous Infusions: . sodium chloride 50 mL/hr at 09/22/15 0107  . heparin 1,200 Units/hr (09/21/15 1255)    Time spent: 25 min   LOS: 2 days   Marinda Elk  [facility] Pager [phone]  *Please refer to [email or web address], password [email or web address] to get updated schedule on who will round on this patient, as hospitalists switch teams weekly. If 7PM-7AM, please contact night-coverage at [email or web address], password [email or web address] for any overnight needs.  09/22/2015, 1:00 PM

## 2015-09-22 NOTE — Progress Notes (Signed)
Paged MD about BP of 175/51. Orders given for PRN Hydralazine. Will carry out order at this time.  Delrae SawyersKristen A Deandre Stansel, RN

## 2015-09-22 NOTE — Progress Notes (Signed)
ANTICOAGULATION CONSULT NOTE - Follow Up Consult  Pharmacy Consult for Heparin  Indication: pulmonary embolus  Allergies  Allergen Reactions  . Pneumovax [Pneumococcal Polysaccharide Vaccine] Other (See Comments)    Deathly sick  . Penicillins Other (See Comments)    unknown   Patient Measurements: Height: 5\' 5"  (165.1 cm) Weight: 163 lb 2.3 oz (74 kg) IBW/kg (Calculated) : 57  Vital Signs: Temp: 98.1 F (36.7 C) (11/23 2118) BP: 197/68 mmHg (11/24 0649) Pulse Rate: 54 (11/23 2118)  Labs:  Recent Labs  09/20/15 1523 09/20/15 2147 09/21/15 0019 09/21/15 0341 09/21/15 0755 09/22/15 0304  HGB 10.4*  --   --  9.3*  --  9.0*  HCT 35.8*  --   --  32.0*  --  31.0*  PLT 280  --   --  231  --  254  HEPARINUNFRC  --   --  0.36  --  0.30 0.34  CREATININE 1.29*  --   --  1.21*  --   --   TROPONINI  --  <0.03  --  <0.03  --   --     Estimated Creatinine Clearance: 35.5 mL/min (by C-G formula based on Cr of 1.21).  Assessment: Heparin for new onset PE and possibly DVT given leg swelling. Heparin level remains therapeutic at 0.34, H/H is low but stable and no bleeding noted.   Goal of Therapy:  Heparin level 0.3-0.7 units/ml Monitor platelets by anticoagulation protocol: Yes   Plan:  -Continue heparin at 1200 units/hr -Daily CBC/HL -Monitor for bleeding  Ilham Roughton C. Marvis MoellerMiles, PharmD Pharmacy Resident  Pager: [phone] 09/22/2015 9:10 AM

## 2015-09-23 LAB — HEPARIN LEVEL (UNFRACTIONATED): Heparin Unfractionated: 0.43 IU/mL (ref 0.30–0.70)

## 2015-09-23 LAB — URINE CULTURE

## 2015-09-23 LAB — CBC
HEMATOCRIT: 32.3 % — AB (ref 36.0–46.0)
HEMOGLOBIN: 9.4 g/dL — AB (ref 12.0–15.0)
MCH: 22.9 pg — AB (ref 26.0–34.0)
MCHC: 29.1 g/dL — ABNORMAL LOW (ref 30.0–36.0)
MCV: 78.6 fL (ref 78.0–100.0)
Platelets: 276 10*3/uL (ref 150–400)
RBC: 4.11 MIL/uL (ref 3.87–5.11)
RDW: 19.7 % — ABNORMAL HIGH (ref 11.5–15.5)
WBC: 8.6 10*3/uL (ref 4.0–10.5)

## 2015-09-23 LAB — BASIC METABOLIC PANEL
ANION GAP: 6 (ref 5–15)
BUN: 9 mg/dL (ref 6–20)
CHLORIDE: 110 mmol/L (ref 101–111)
CO2: 25 mmol/L (ref 22–32)
Calcium: 8.3 mg/dL — ABNORMAL LOW (ref 8.9–10.3)
Creatinine, Ser: 0.97 mg/dL (ref 0.44–1.00)
GFR calc Af Amer: >60 mL/min (ref >60)
GFR, EST NON AFRICAN AMERICAN: 53 mL/min — AB (ref >60)
GLUCOSE: 104 mg/dL — AB (ref 65–99)
POTASSIUM: 3.6 mmol/L (ref 3.5–5.1)
Sodium: 141 mmol/L (ref 135–145)

## 2015-09-23 MED ORDER — CEPHALEXIN 500 MG PO CAPS: 500.0000 mg | ORAL_CAPSULE | Freq: Three times a day (TID) | ORAL | Status: AC

## 2015-09-23 MED ORDER — APIXABAN 5 MG PO TABS: 10.0000 mg | ORAL_TABLET | Freq: Two times a day (BID) | ORAL | Status: DC

## 2015-09-23 MED ORDER — APIXABAN 5 MG PO TABS: 5.0000 mg | ORAL_TABLET | Freq: Two times a day (BID) | ORAL | Status: DC

## 2015-09-23 MED ORDER — CEPHALEXIN 500 MG PO CAPS
500.0000 mg | ORAL_CAPSULE | Freq: Three times a day (TID) | ORAL | Status: DC
  Administered 2015-09-23: 500 mg via ORAL
  Filled 2015-09-23: qty 1

## 2015-09-23 MED ORDER — APIXABAN 5 MG PO TABS: 5.0000 mg | ORAL_TABLET | Freq: Two times a day (BID) | ORAL | Status: AC

## 2015-09-23 MED ORDER — APIXABAN 5 MG PO TABS
10.0000 mg | ORAL_TABLET | Freq: Two times a day (BID) | ORAL | Status: DC
  Administered 2015-09-23: 10 mg via ORAL
  Filled 2015-09-23: qty 2

## 2015-09-23 MED ORDER — APIXABAN 5 MG PO TABS: 10.0000 mg | ORAL_TABLET | Freq: Two times a day (BID) | ORAL | Status: AC

## 2015-09-23 NOTE — Progress Notes (Signed)
SATURATION QUALIFICATIONS: (This note is used to comply with regulatory documentation for home oxygen)  Patient Saturations on Room Air at Rest = 95%  Patient Saturations on Room Air while Ambulating = 95%  Patient Saturations on 2 Liters of oxygen while Ambulating = 98%  Please briefly explain why patient needs home oxygen: 

## 2015-09-23 NOTE — Discharge Summary (Signed)
Physician Discharge Summary  Heather Chan [id number] DOB: 12/17/1929 DOA: 09/20/2015  [facility]  Admit date: 09/20/2015 Discharge date: 09/23/2015  Time spent: 35  minutes  Recommendations for Outpatient Follow-up:  1. Follow up with PCP 2-4 weeks.   Discharge Diagnoses:  Principal Problem:   Acute saddle pulmonary embolism (HCC) Active Problems:   Hypothyroidism   Alzheimer disease   Hyperlipemia   Uncontrolled hypertension   Acute respiratory failure with hypoxia (HCC)   Acute renal failure (ARF) (HCC)   Discharge Condition: stable  Diet recommendation: heart healthy  Filed Weights   09/20/15 1528 09/21/15 1012  Weight: 74.39 kg (164 lb) 74 kg (163 lb 2.3 oz)    History of present illness:  81 year old with past nuchal history of hypertension and Alzheimer's dementia and sciatic depression that presented to the primary care physician for shortness of breath after being diagnosed with a bilateral pulmonary emboli.  Hospital Course:  Acute respiratory failure with hypoxia due to acute central pulmonary emboli: She was started on IV heparin echo cardiogram showed normal IV dilation to approximately the lower extremity showed acute deep vein thromboses blood pressure and heart rate remained stable. The 2 days of being on IV heparin her oxygen saturation improved. She'll go home on Eluquis for 3-6 months.  UTI: As her urine was foul-smelling a UA was checked showed Escherichia coli pansensitive she was changed to Keflex which she will continue for 4 additional days.  Hypothyroidism no gingival return medication.  Alzheimer's disease: Continue current regimen no changes were made.  Uncontrolled essential hypertension: No changes were made to her medication.  Acute kidney injury: Likely prerenal does resolve with IV fluid hydration.   Procedures:  CT angio chest  Lower ext doppler.  Consultations:  none  Discharge Exam: Filed  Vitals:   09/23/15 0735 09/23/15 1054  BP: 159/56 146/40  Pulse: 66 77  Temp:    Resp:  18    General: A&O x3 Cardiovascular: RRR Respiratory: good air movement CTA B/L  Discharge Instructions   Discharge Instructions    Diet - low sodium heart healthy    Complete by:  As directed      Increase activity slowly    Complete by:  As directed           Current Discharge Medication List    START taking these medications   Details  !! apixaban (ELIQUIS) 5 MG TABS tablet Take 1 tablet (5 mg total) by mouth 2 (two) times daily. Qty: 60 tablet, Refills: 3    !! apixaban (ELIQUIS) 5 MG TABS tablet Take 2 tablets (10 mg total) by mouth 2 (two) times daily. Qty: 14 tablet, Refills: 0    cephALEXin (KEFLEX) 500 MG capsule Take 1 capsule (500 mg total) by mouth every 8 (eight) hours. Qty: 12 capsule, Refills: 0     !! - Potential duplicate medications found. Please discuss with provider.    CONTINUE these medications which have NOT CHANGED   Details  citalopram (CELEXA) 20 MG tablet Take 40 mg by mouth daily.    donepezil (ARICEPT) 10 MG tablet Take 10 mg by mouth at bedtime.    iron polysaccharides (NU-IRON) 150 MG capsule Take 150 mg by mouth 2 (two) times daily.    levothyroxine (SYNTHROID, LEVOTHROID) 75 MCG tablet Take 75 mcg by mouth daily before breakfast.    memantine (NAMENDA) 10 MG tablet Take 20 mg by mouth daily.    metoprolol succinate (TOPROL-XL) 25 MG 24  hr tablet Take 12.5 mg by mouth daily.    pravastatin (PRAVACHOL) 20 MG tablet Take 20 mg by mouth daily.    risperiDONE (RISPERDAL) 0.25 MG tablet Take 0.25 mg by mouth 2 (two) times daily.      STOP taking these medications     metoprolol tartrate (LOPRESSOR) 25 MG tablet        Allergies  Allergen Reactions  . Pneumovax [Pneumococcal Polysaccharide Vaccine] Other (See Comments)    Deathly sick  . Penicillins Other (See Comments)    unknown      The results of significant diagnostics from  this hospitalization (including imaging, microbiology, ancillary and laboratory) are listed below for reference.    Significant Diagnostic Studies: No results found.  Microbiology: Recent Results (from the past 240 hour(s))  MRSA PCR Screening     Status: None   Collection Time: 09/20/15  6:00 PM  Result Value Ref Range Status   MRSA by PCR NEGATIVE NEGATIVE Final    Comment:        The GeneXpert MRSA Assay (FDA approved for NASAL specimens only), is one component of a comprehensive MRSA colonization surveillance program. It is not intended to diagnose MRSA infection nor to guide or monitor treatment for MRSA infections.   Urine culture     Status: None   Collection Time: 09/21/15  7:47 AM  Result Value Ref Range Status   Specimen Description URINE, RANDOM  Final   Special Requests NONE  Final   Culture >=100,000 COLONIES/mL ESCHERICHIA COLI  Final   Report Status 09/23/2015 FINAL  Final   Organism ID, Bacteria ESCHERICHIA COLI  Final      Susceptibility   Escherichia coli - MIC*    AMPICILLIN 8 SENSITIVE Sensitive     CEFAZOLIN <=4 SENSITIVE Sensitive     CEFTRIAXONE <=1 SENSITIVE Sensitive     CIPROFLOXACIN <=0.25 SENSITIVE Sensitive     GENTAMICIN <=1 SENSITIVE Sensitive     IMIPENEM <=0.25 SENSITIVE Sensitive     NITROFURANTOIN <=16 SENSITIVE Sensitive     TRIMETH/SULFA <=20 SENSITIVE Sensitive     AMPICILLIN/SULBACTAM 4 SENSITIVE Sensitive     PIP/TAZO <=4 SENSITIVE Sensitive     * >=100,000 COLONIES/mL ESCHERICHIA COLI     Labs: Basic Metabolic Panel:  Recent Labs Lab 09/20/15 1523 09/21/15 0341 09/23/15 0427  NA 137 138 141  K 4.1 3.5 3.6  CL 103 104 110  CO2 22 25 25   GLUCOSE 118* 107* 104*  BUN 12 17 9   CREATININE 1.29* 1.21* 0.97  CALCIUM 8.8* 8.6* 8.3*   Liver Function Tests: No results for input(s): AST, ALT, ALKPHOS, BILITOT, PROT, ALBUMIN in the last 168 hours. No results for input(s): LIPASE, AMYLASE in the last 168 hours. No results for  input(s): AMMONIA in the last 168 hours. CBC:  Recent Labs Lab 09/20/15 1523 09/21/15 0341 09/22/15 0304 09/23/15 0427  WBC 14.6* 8.8 8.9 8.6  NEUTROABS 11.8*  --   --   --   HGB 10.4* 9.3* 9.0* 9.4*  HCT 35.8* 32.0* 31.0* 32.3*  MCV 78.5 78.0 78.3 78.6  PLT 280 231 254 276   Cardiac Enzymes:  Recent Labs Lab 09/20/15 2147 09/21/15 0341  TROPONINI <0.03 <0.03   BNP: BNP (last 3 results)  Recent Labs  09/20/15 1523  BNP 703.4*    ProBNP (last 3 results) No results for input(s): PROBNP in the last 8760 hours.  CBG: No results for input(s): GLUCAP in the last 168 hours.  Signed:  Marinda Elk  [facility] 09/23/2015, 1:12 PM

## 2015-09-23 NOTE — Discharge Instructions (Signed)
Information on my medicine - ELIQUIS (apixaban)  This medication education was reviewed with me or my healthcare representative as part of my discharge preparation.    Why was Eliquis prescribed for you? Eliquis was prescribed to treat blood clots that may have been found in the veins of your legs (deep vein thrombosis) or in your lungs (pulmonary embolism) and to reduce the risk of them occurring again.  What do You need to know about Eliquis ? The starting dose is 10 mg (two 5 mg tablets) taken TWICE daily for the FIRST SEVEN (7) DAYS, then on (enter date)  12/2  the dose is reduced to ONE 5 mg tablet taken TWICE daily.  Eliquis may be taken with or without food.   Try to take the dose about the same time in the morning and in the evening. If you have difficulty swallowing the tablet whole please discuss with your pharmacist how to take the medication safely.  Take Eliquis exactly as prescribed and DO NOT stop taking Eliquis without talking to the doctor who prescribed the medication.  Stopping may increase your risk of developing a new blood clot.  Refill your prescription before you run out.  After discharge, you should have regular check-up appointments with your healthcare provider that is prescribing your Eliquis.    What do you do if you miss a dose? If a dose of ELIQUIS is not taken at the scheduled time, take it as soon as possible on the same day and twice-daily administration should be resumed. The dose should not be doubled to make up for a missed dose.  Important Safety Information A possible side effect of Eliquis is bleeding. You should call your healthcare provider right away if you experience any of the following: ? Bleeding from an injury or your nose that does not stop. ? Unusual colored urine (red or dark brown) or unusual colored stools (red or black). ? Unusual bruising for unknown reasons. ? A serious fall or if you hit your head (even if there is no  bleeding).  Some medicines may interact with Eliquis and might increase your risk of bleeding or clotting while on Eliquis. To help avoid this, consult your healthcare provider or pharmacist prior to using any new prescription or non-prescription medications, including herbals, vitamins, non-steroidal anti-inflammatory drugs (NSAIDs) and supplements.  This website has more information on Eliquis (apixaban): http://www.eliquis.com/eliquis/home

## 2015-09-23 NOTE — Progress Notes (Signed)
Physical Therapy Treatment Patient Details Name: Heather Chan MRN: [id number] DOB: [birth year] Today's Date: 09/23/2015    History of Present Illness Heather Chan is a 81 y.o. female with history of hypertension, dementia, anxiety, hypothyroidism, dyslipidemia presented from PCPs office with acute PE.  Patient also with RLE DVT.    PT Comments    Patient making good progress with mobility and gait.  Agree with HHPT at discharge.  Follow Up Recommendations  Home health PT;Supervision/Assistance - 24 hour     Equipment Recommendations  None recommended by PT    Recommendations for Other Services       Precautions / Restrictions Precautions Precautions: Fall Restrictions Weight Bearing Restrictions: No    Mobility  Bed Mobility Overal bed mobility: Needs Assistance Bed Mobility: Supine to Sit     Supine to sit: Min guard;HOB elevated     General bed mobility comments: Verbal cues for technique.  No physical assist needed.  Transfers Overall transfer level: Needs assistance Equipment used: Rolling walker (2 wheeled) Transfers: Sit to/from Stand Sit to Stand: Min assist         General transfer comment: Verbal cues for hand placement.  Patient required only min guard assist to move to standing from bed.  Required min assist (physical and verbal cues) for safely sitting into chair.  Patient began sitting too early.  Cues for safe technique.  Ambulation/Gait Ambulation/Gait assistance: Min assist Ambulation Distance (Feet): 36 Feet Assistive device: Rolling walker (2 wheeled) Gait Pattern/deviations: Step-through pattern;Decreased step length - right;Decreased step length - left;Decreased stride length;Shuffle;Trunk flexed Gait velocity: decreased Gait velocity interpretation: Below normal speed for age/gender General Gait Details: Verbal cues to stand upright.  Assist to maneuver RW during turns.  No loss of balance during gait.   Stairs             Wheelchair Mobility    Modified Rankin (Stroke Patients Only)       Balance           Standing balance support: Bilateral upper extremity supported Standing balance-Leahy Scale: Poor                      Cognition Arousal/Alertness: Awake/alert Behavior During Therapy: WFL for tasks assessed/performed Overall Cognitive Status: History of cognitive impairments - at baseline                      Exercises      General Comments        Pertinent Vitals/Pain Pain Assessment: No/denies pain    Home Living                      Prior Function            PT Goals (current goals can now be found in the care plan section) Progress towards PT goals: Progressing toward goals    Frequency  Min 3X/week    PT Plan Current plan remains appropriate    Co-evaluation             End of Session Equipment Utilized During Treatment: Gait belt Activity Tolerance: Patient limited by fatigue Patient left: in chair;with call bell/phone within reach;with chair alarm set;with family/visitor present     Time: 1200-1218 PT Time Calculation (min) (ACUTE ONLY): 18 min  Charges:  $Gait Training: 8-22 mins                    G Codes:  Vena Austria 09/23/2015, 12:27 PM Durenda Hurt. Renaldo Fiddler, [facility] Acute Rehab Services Pager [phone]

## 2015-09-23 NOTE — Progress Notes (Signed)
Patient was discharged home with home health by MD order; discharged instructions  review and give to patient and her daughter with care notes and prescriptions; IV DIC;  patient will be escorted to the car by nurse tech via wheelchair.  

## 2015-09-23 NOTE — Progress Notes (Addendum)
CM spoke to Dr. Robb Matarrtiz to advise [facility] and [facility] and he said that he would enter the orders. Dr. Robb Matarrtiz ordered PT/OT and cm called and spoke with daughter and she agreed to PT and OT. CM [facility] to [facility] and advise that face to face and orders were entered.

## 2015-09-23 NOTE — Care Management Note (Addendum)
Case Management Note  Patient Details  Name: Heather Chan MRN: [id number] Date of Birth: 31-Aug-1930  Subjective/Objective:                  Acute PE and RLE DVT; history of hypertension, dementia, anxiety, hypothyroidism, dyslipidemia  Action/Plan: CM spoke to patient and daughter at the bedside to advise [facility]. Both in agreeance. CM offered patient choice and adult daughter, Heather Chan, states [facility]. [facility] and spoke with Lupita LeashDonna. She accepted referral. Patient and daughter [facility] the need [facility]. CM asked about DME needs and daughter said they have everything they need: walker, 3N1, shower chair. No difficulty obtaining medications at this time. Daughter able to provide 24 hours supervision. CM remains available should further needs arise. CM paged Dr. Robb Matarrtiz for home health orders and face to face to be completed.   Expected Discharge Date:  09/23/15               Expected Discharge Plan:  Home w Home Health Services  In-House Referral:     Discharge planning Services  CM Consult  Post Acute Care Choice:  Home Health Choice offered to:  Patient, Adult Children  DME Arranged:    DME Agency:     HH Arranged:  PT HH Agency:  [facility]  Status of Service:  Completed, signed off  Medicare Important Message Given:    Date Medicare IM Given:    Medicare IM give by:    Date Additional Medicare IM Given:    Additional Medicare Important Message give by:     If discussed at Long Length of Stay Meetings, dates discussed:    Additional Comments:  Darcel SmallingAnna C Zamara Cozad, RN 09/23/2015, 1:33 PM

## 2015-09-23 NOTE — Progress Notes (Signed)
ANTICOAGULATION CONSULT NOTE - Follow Up Consult  Pharmacy Consult for Heparin  Indication: pulmonary embolus  Allergies  Allergen Reactions  . Pneumovax [Pneumococcal Polysaccharide Vaccine] Other (See Comments)    Deathly sick  . Penicillins Other (See Comments)    unknown   Patient Measurements: Height: 5\' 5"  (165.1 cm) Weight: 163 lb 2.3 oz (74 kg) IBW/kg (Calculated) : 57  Vital Signs: Temp: 98 F (36.7 C) (11/25 0642) Temp Source: Oral (11/24 2156) BP: 179/52 mmHg (11/25 0642) Pulse Rate: 67 (11/25 0642)  Labs:  Recent Labs  09/20/15 1523 09/20/15 2147  09/21/15 0341 09/21/15 0755 09/22/15 0304 09/23/15 0427  HGB 10.4*  --   --  9.3*  --  9.0* 9.4*  HCT 35.8*  --   --  32.0*  --  31.0* 32.3*  PLT 280  --   --  231  --  254 276  HEPARINUNFRC  --   --   < >  --  0.30 0.34 0.43  CREATININE 1.29*  --   --  1.21*  --   --  0.97  TROPONINI  --  <0.03  --  <0.03  --   --   --   < > = values in this interval not displayed.  Estimated Creatinine Clearance: 44.3 mL/min (by C-G formula based on Cr of 0.97).  Assessment: Heparin for new onset PE and possibly DVT given leg swelling. Heparin level remains therapeutic at 0.43, H/H is low but stable and no bleeding noted.   Goal of Therapy:  Heparin level 0.3-0.7 units/ml Monitor platelets by anticoagulation protocol: Yes   Plan:  -Continue heparin at 1200 units/hr -Daily CBC/HL -Monitor for bleeding  Lysle Pearlachel Curtisha Bendix, PharmD, BCPS Pager # [phone] 09/23/2015 8:20 AM

## 2015-09-26 DIAGNOSIS — I2692 Saddle embolus of pulmonary artery without acute cor pulmonale: Secondary | ICD-10-CM | POA: Diagnosis not present

## 2015-09-26 DIAGNOSIS — Z8673 Personal history of transient ischemic attack (TIA), and cerebral infarction without residual deficits: Secondary | ICD-10-CM | POA: Diagnosis not present

## 2015-09-26 DIAGNOSIS — N390 Urinary tract infection, site not specified: Secondary | ICD-10-CM | POA: Diagnosis not present

## 2015-09-26 DIAGNOSIS — E039 Hypothyroidism, unspecified: Secondary | ICD-10-CM | POA: Diagnosis not present

## 2015-09-26 DIAGNOSIS — Z7901 Long term (current) use of anticoagulants: Secondary | ICD-10-CM | POA: Diagnosis not present

## 2015-09-26 DIAGNOSIS — Z792 Long term (current) use of antibiotics: Secondary | ICD-10-CM | POA: Diagnosis not present

## 2015-09-26 DIAGNOSIS — F0280 Dementia in other diseases classified elsewhere without behavioral disturbance: Secondary | ICD-10-CM | POA: Diagnosis not present

## 2015-09-26 DIAGNOSIS — F329 Major depressive disorder, single episode, unspecified: Secondary | ICD-10-CM | POA: Diagnosis not present

## 2015-09-26 DIAGNOSIS — G309 Alzheimer's disease, unspecified: Secondary | ICD-10-CM | POA: Diagnosis not present

## 2015-09-26 DIAGNOSIS — I10 Essential (primary) hypertension: Secondary | ICD-10-CM | POA: Diagnosis not present

## 2015-09-26 DIAGNOSIS — E785 Hyperlipidemia, unspecified: Secondary | ICD-10-CM | POA: Diagnosis not present

## 2015-09-26 DIAGNOSIS — R2689 Other abnormalities of gait and mobility: Secondary | ICD-10-CM | POA: Diagnosis not present

## 2015-09-26 DIAGNOSIS — Z9181 History of falling: Secondary | ICD-10-CM | POA: Diagnosis not present

## 2015-09-26 DIAGNOSIS — B9620 Unspecified Escherichia coli [E. coli] as the cause of diseases classified elsewhere: Secondary | ICD-10-CM | POA: Diagnosis not present

## 2015-09-26 DIAGNOSIS — F419 Anxiety disorder, unspecified: Secondary | ICD-10-CM | POA: Diagnosis not present

## 2015-09-28 DIAGNOSIS — N390 Urinary tract infection, site not specified: Secondary | ICD-10-CM | POA: Diagnosis not present

## 2015-09-28 DIAGNOSIS — F0280 Dementia in other diseases classified elsewhere without behavioral disturbance: Secondary | ICD-10-CM | POA: Diagnosis not present

## 2015-09-28 DIAGNOSIS — R2689 Other abnormalities of gait and mobility: Secondary | ICD-10-CM | POA: Diagnosis not present

## 2015-09-28 DIAGNOSIS — I2692 Saddle embolus of pulmonary artery without acute cor pulmonale: Secondary | ICD-10-CM | POA: Diagnosis not present

## 2015-09-28 DIAGNOSIS — B9620 Unspecified Escherichia coli [E. coli] as the cause of diseases classified elsewhere: Secondary | ICD-10-CM | POA: Diagnosis not present

## 2015-09-28 DIAGNOSIS — G309 Alzheimer's disease, unspecified: Secondary | ICD-10-CM | POA: Diagnosis not present

## 2015-09-30 DIAGNOSIS — G309 Alzheimer's disease, unspecified: Secondary | ICD-10-CM | POA: Diagnosis not present

## 2015-09-30 DIAGNOSIS — F0280 Dementia in other diseases classified elsewhere without behavioral disturbance: Secondary | ICD-10-CM | POA: Diagnosis not present

## 2015-09-30 DIAGNOSIS — N390 Urinary tract infection, site not specified: Secondary | ICD-10-CM | POA: Diagnosis not present

## 2015-09-30 DIAGNOSIS — I2692 Saddle embolus of pulmonary artery without acute cor pulmonale: Secondary | ICD-10-CM | POA: Diagnosis not present

## 2015-09-30 DIAGNOSIS — R2689 Other abnormalities of gait and mobility: Secondary | ICD-10-CM | POA: Diagnosis not present

## 2015-09-30 DIAGNOSIS — B9620 Unspecified Escherichia coli [E. coli] as the cause of diseases classified elsewhere: Secondary | ICD-10-CM | POA: Diagnosis not present

## 2015-10-03 DIAGNOSIS — N390 Urinary tract infection, site not specified: Secondary | ICD-10-CM | POA: Diagnosis not present

## 2015-10-03 DIAGNOSIS — G309 Alzheimer's disease, unspecified: Secondary | ICD-10-CM | POA: Diagnosis not present

## 2015-10-03 DIAGNOSIS — B9620 Unspecified Escherichia coli [E. coli] as the cause of diseases classified elsewhere: Secondary | ICD-10-CM | POA: Diagnosis not present

## 2015-10-03 DIAGNOSIS — I2692 Saddle embolus of pulmonary artery without acute cor pulmonale: Secondary | ICD-10-CM | POA: Diagnosis not present

## 2015-10-03 DIAGNOSIS — F0280 Dementia in other diseases classified elsewhere without behavioral disturbance: Secondary | ICD-10-CM | POA: Diagnosis not present

## 2015-10-03 DIAGNOSIS — R2689 Other abnormalities of gait and mobility: Secondary | ICD-10-CM | POA: Diagnosis not present

## 2015-10-05 DIAGNOSIS — I2699 Other pulmonary embolism without acute cor pulmonale: Secondary | ICD-10-CM | POA: Diagnosis not present

## 2015-10-06 DIAGNOSIS — F0280 Dementia in other diseases classified elsewhere without behavioral disturbance: Secondary | ICD-10-CM | POA: Diagnosis not present

## 2015-10-06 DIAGNOSIS — R2689 Other abnormalities of gait and mobility: Secondary | ICD-10-CM | POA: Diagnosis not present

## 2015-10-06 DIAGNOSIS — B9620 Unspecified Escherichia coli [E. coli] as the cause of diseases classified elsewhere: Secondary | ICD-10-CM | POA: Diagnosis not present

## 2015-10-06 DIAGNOSIS — N390 Urinary tract infection, site not specified: Secondary | ICD-10-CM | POA: Diagnosis not present

## 2015-10-06 DIAGNOSIS — G309 Alzheimer's disease, unspecified: Secondary | ICD-10-CM | POA: Diagnosis not present

## 2015-10-06 DIAGNOSIS — I2692 Saddle embolus of pulmonary artery without acute cor pulmonale: Secondary | ICD-10-CM | POA: Diagnosis not present

## 2015-10-07 DIAGNOSIS — R2689 Other abnormalities of gait and mobility: Secondary | ICD-10-CM | POA: Diagnosis not present

## 2015-10-07 DIAGNOSIS — F0280 Dementia in other diseases classified elsewhere without behavioral disturbance: Secondary | ICD-10-CM | POA: Diagnosis not present

## 2015-10-07 DIAGNOSIS — B9620 Unspecified Escherichia coli [E. coli] as the cause of diseases classified elsewhere: Secondary | ICD-10-CM | POA: Diagnosis not present

## 2015-10-07 DIAGNOSIS — G309 Alzheimer's disease, unspecified: Secondary | ICD-10-CM | POA: Diagnosis not present

## 2015-10-07 DIAGNOSIS — N390 Urinary tract infection, site not specified: Secondary | ICD-10-CM | POA: Diagnosis not present

## 2015-10-07 DIAGNOSIS — I2692 Saddle embolus of pulmonary artery without acute cor pulmonale: Secondary | ICD-10-CM | POA: Diagnosis not present

## 2015-10-10 DIAGNOSIS — B9620 Unspecified Escherichia coli [E. coli] as the cause of diseases classified elsewhere: Secondary | ICD-10-CM | POA: Diagnosis not present

## 2015-10-10 DIAGNOSIS — I2692 Saddle embolus of pulmonary artery without acute cor pulmonale: Secondary | ICD-10-CM | POA: Diagnosis not present

## 2015-10-10 DIAGNOSIS — F0280 Dementia in other diseases classified elsewhere without behavioral disturbance: Secondary | ICD-10-CM | POA: Diagnosis not present

## 2015-10-10 DIAGNOSIS — N390 Urinary tract infection, site not specified: Secondary | ICD-10-CM | POA: Diagnosis not present

## 2015-10-10 DIAGNOSIS — G309 Alzheimer's disease, unspecified: Secondary | ICD-10-CM | POA: Diagnosis not present

## 2015-10-10 DIAGNOSIS — R2689 Other abnormalities of gait and mobility: Secondary | ICD-10-CM | POA: Diagnosis not present

## 2015-10-11 DIAGNOSIS — F0280 Dementia in other diseases classified elsewhere without behavioral disturbance: Secondary | ICD-10-CM | POA: Diagnosis not present

## 2015-10-11 DIAGNOSIS — N390 Urinary tract infection, site not specified: Secondary | ICD-10-CM | POA: Diagnosis not present

## 2015-10-11 DIAGNOSIS — G309 Alzheimer's disease, unspecified: Secondary | ICD-10-CM | POA: Diagnosis not present

## 2015-10-11 DIAGNOSIS — R2689 Other abnormalities of gait and mobility: Secondary | ICD-10-CM | POA: Diagnosis not present

## 2015-10-11 DIAGNOSIS — I2692 Saddle embolus of pulmonary artery without acute cor pulmonale: Secondary | ICD-10-CM | POA: Diagnosis not present

## 2015-10-11 DIAGNOSIS — B9620 Unspecified Escherichia coli [E. coli] as the cause of diseases classified elsewhere: Secondary | ICD-10-CM | POA: Diagnosis not present

## 2015-10-12 DIAGNOSIS — F0280 Dementia in other diseases classified elsewhere without behavioral disturbance: Secondary | ICD-10-CM | POA: Diagnosis not present

## 2015-10-12 DIAGNOSIS — R2689 Other abnormalities of gait and mobility: Secondary | ICD-10-CM | POA: Diagnosis not present

## 2015-10-12 DIAGNOSIS — I2692 Saddle embolus of pulmonary artery without acute cor pulmonale: Secondary | ICD-10-CM | POA: Diagnosis not present

## 2015-10-12 DIAGNOSIS — G309 Alzheimer's disease, unspecified: Secondary | ICD-10-CM | POA: Diagnosis not present

## 2015-10-12 DIAGNOSIS — N390 Urinary tract infection, site not specified: Secondary | ICD-10-CM | POA: Diagnosis not present

## 2015-10-12 DIAGNOSIS — B9620 Unspecified Escherichia coli [E. coli] as the cause of diseases classified elsewhere: Secondary | ICD-10-CM | POA: Diagnosis not present

## 2015-10-17 DIAGNOSIS — F0280 Dementia in other diseases classified elsewhere without behavioral disturbance: Secondary | ICD-10-CM | POA: Diagnosis not present

## 2015-10-17 DIAGNOSIS — I2692 Saddle embolus of pulmonary artery without acute cor pulmonale: Secondary | ICD-10-CM | POA: Diagnosis not present

## 2015-10-17 DIAGNOSIS — R2689 Other abnormalities of gait and mobility: Secondary | ICD-10-CM | POA: Diagnosis not present

## 2015-10-17 DIAGNOSIS — N390 Urinary tract infection, site not specified: Secondary | ICD-10-CM | POA: Diagnosis not present

## 2015-10-17 DIAGNOSIS — B9620 Unspecified Escherichia coli [E. coli] as the cause of diseases classified elsewhere: Secondary | ICD-10-CM | POA: Diagnosis not present

## 2015-10-17 DIAGNOSIS — G309 Alzheimer's disease, unspecified: Secondary | ICD-10-CM | POA: Diagnosis not present

## 2015-10-19 DIAGNOSIS — B9620 Unspecified Escherichia coli [E. coli] as the cause of diseases classified elsewhere: Secondary | ICD-10-CM | POA: Diagnosis not present

## 2015-10-19 DIAGNOSIS — G309 Alzheimer's disease, unspecified: Secondary | ICD-10-CM | POA: Diagnosis not present

## 2015-10-19 DIAGNOSIS — N390 Urinary tract infection, site not specified: Secondary | ICD-10-CM | POA: Diagnosis not present

## 2015-10-19 DIAGNOSIS — R2689 Other abnormalities of gait and mobility: Secondary | ICD-10-CM | POA: Diagnosis not present

## 2015-10-19 DIAGNOSIS — F0280 Dementia in other diseases classified elsewhere without behavioral disturbance: Secondary | ICD-10-CM | POA: Diagnosis not present

## 2015-10-19 DIAGNOSIS — I2692 Saddle embolus of pulmonary artery without acute cor pulmonale: Secondary | ICD-10-CM | POA: Diagnosis not present

## 2015-10-22 DIAGNOSIS — F329 Major depressive disorder, single episode, unspecified: Secondary | ICD-10-CM | POA: Diagnosis not present

## 2015-10-22 DIAGNOSIS — I2699 Other pulmonary embolism without acute cor pulmonale: Secondary | ICD-10-CM | POA: Diagnosis not present

## 2015-10-22 DIAGNOSIS — Z6828 Body mass index (BMI) 28.0-28.9, adult: Secondary | ICD-10-CM | POA: Diagnosis not present

## 2015-10-22 DIAGNOSIS — G309 Alzheimer's disease, unspecified: Secondary | ICD-10-CM | POA: Diagnosis not present

## 2015-10-22 DIAGNOSIS — E784 Other hyperlipidemia: Secondary | ICD-10-CM | POA: Diagnosis not present

## 2015-10-22 DIAGNOSIS — M1990 Unspecified osteoarthritis, unspecified site: Secondary | ICD-10-CM | POA: Diagnosis not present

## 2015-10-22 DIAGNOSIS — I10 Essential (primary) hypertension: Secondary | ICD-10-CM | POA: Diagnosis not present

## 2015-10-22 DIAGNOSIS — E038 Other specified hypothyroidism: Secondary | ICD-10-CM | POA: Diagnosis not present

## 2015-10-26 DIAGNOSIS — I2692 Saddle embolus of pulmonary artery without acute cor pulmonale: Secondary | ICD-10-CM | POA: Diagnosis not present

## 2015-10-26 DIAGNOSIS — R2689 Other abnormalities of gait and mobility: Secondary | ICD-10-CM | POA: Diagnosis not present

## 2015-10-26 DIAGNOSIS — N390 Urinary tract infection, site not specified: Secondary | ICD-10-CM | POA: Diagnosis not present

## 2015-10-26 DIAGNOSIS — F0280 Dementia in other diseases classified elsewhere without behavioral disturbance: Secondary | ICD-10-CM | POA: Diagnosis not present

## 2015-10-26 DIAGNOSIS — B9620 Unspecified Escherichia coli [E. coli] as the cause of diseases classified elsewhere: Secondary | ICD-10-CM | POA: Diagnosis not present

## 2015-10-26 DIAGNOSIS — G309 Alzheimer's disease, unspecified: Secondary | ICD-10-CM | POA: Diagnosis not present

## 2015-10-28 DIAGNOSIS — B9620 Unspecified Escherichia coli [E. coli] as the cause of diseases classified elsewhere: Secondary | ICD-10-CM | POA: Diagnosis not present

## 2015-10-28 DIAGNOSIS — N390 Urinary tract infection, site not specified: Secondary | ICD-10-CM | POA: Diagnosis not present

## 2015-10-28 DIAGNOSIS — F0280 Dementia in other diseases classified elsewhere without behavioral disturbance: Secondary | ICD-10-CM | POA: Diagnosis not present

## 2015-10-28 DIAGNOSIS — I2692 Saddle embolus of pulmonary artery without acute cor pulmonale: Secondary | ICD-10-CM | POA: Diagnosis not present

## 2015-10-28 DIAGNOSIS — G309 Alzheimer's disease, unspecified: Secondary | ICD-10-CM | POA: Diagnosis not present

## 2015-10-28 DIAGNOSIS — R2689 Other abnormalities of gait and mobility: Secondary | ICD-10-CM | POA: Diagnosis not present

## 2015-10-31 DIAGNOSIS — G309 Alzheimer's disease, unspecified: Secondary | ICD-10-CM | POA: Diagnosis not present

## 2015-10-31 DIAGNOSIS — I2692 Saddle embolus of pulmonary artery without acute cor pulmonale: Secondary | ICD-10-CM | POA: Diagnosis not present

## 2015-10-31 DIAGNOSIS — B9620 Unspecified Escherichia coli [E. coli] as the cause of diseases classified elsewhere: Secondary | ICD-10-CM | POA: Diagnosis not present

## 2015-10-31 DIAGNOSIS — N390 Urinary tract infection, site not specified: Secondary | ICD-10-CM | POA: Diagnosis not present

## 2015-10-31 DIAGNOSIS — F0280 Dementia in other diseases classified elsewhere without behavioral disturbance: Secondary | ICD-10-CM | POA: Diagnosis not present

## 2015-10-31 DIAGNOSIS — R2689 Other abnormalities of gait and mobility: Secondary | ICD-10-CM | POA: Diagnosis not present

## 2015-11-04 DIAGNOSIS — R2689 Other abnormalities of gait and mobility: Secondary | ICD-10-CM | POA: Diagnosis not present

## 2015-11-04 DIAGNOSIS — F0280 Dementia in other diseases classified elsewhere without behavioral disturbance: Secondary | ICD-10-CM | POA: Diagnosis not present

## 2015-11-04 DIAGNOSIS — N390 Urinary tract infection, site not specified: Secondary | ICD-10-CM | POA: Diagnosis not present

## 2015-11-04 DIAGNOSIS — G309 Alzheimer's disease, unspecified: Secondary | ICD-10-CM | POA: Diagnosis not present

## 2015-11-04 DIAGNOSIS — I2692 Saddle embolus of pulmonary artery without acute cor pulmonale: Secondary | ICD-10-CM | POA: Diagnosis not present

## 2015-11-04 DIAGNOSIS — B9620 Unspecified Escherichia coli [E. coli] as the cause of diseases classified elsewhere: Secondary | ICD-10-CM | POA: Diagnosis not present

## 2015-11-22 DIAGNOSIS — Z6827 Body mass index (BMI) 27.0-27.9, adult: Secondary | ICD-10-CM | POA: Diagnosis not present

## 2015-11-22 DIAGNOSIS — I2699 Other pulmonary embolism without acute cor pulmonale: Secondary | ICD-10-CM | POA: Diagnosis not present

## 2015-11-22 DIAGNOSIS — R531 Weakness: Secondary | ICD-10-CM | POA: Diagnosis not present

## 2015-11-22 DIAGNOSIS — R0602 Shortness of breath: Secondary | ICD-10-CM | POA: Diagnosis not present

## 2016-01-06 DIAGNOSIS — R531 Weakness: Secondary | ICD-10-CM | POA: Diagnosis not present

## 2016-01-06 DIAGNOSIS — E784 Other hyperlipidemia: Secondary | ICD-10-CM | POA: Diagnosis not present

## 2016-01-06 DIAGNOSIS — I2699 Other pulmonary embolism without acute cor pulmonale: Secondary | ICD-10-CM | POA: Diagnosis not present

## 2016-01-06 DIAGNOSIS — F329 Major depressive disorder, single episode, unspecified: Secondary | ICD-10-CM | POA: Diagnosis not present

## 2016-01-06 DIAGNOSIS — L989 Disorder of the skin and subcutaneous tissue, unspecified: Secondary | ICD-10-CM | POA: Diagnosis not present

## 2016-01-06 DIAGNOSIS — I639 Cerebral infarction, unspecified: Secondary | ICD-10-CM | POA: Diagnosis not present

## 2016-01-06 DIAGNOSIS — Z6826 Body mass index (BMI) 26.0-26.9, adult: Secondary | ICD-10-CM | POA: Diagnosis not present

## 2016-01-06 DIAGNOSIS — G309 Alzheimer's disease, unspecified: Secondary | ICD-10-CM | POA: Diagnosis not present

## 2016-01-06 DIAGNOSIS — E038 Other specified hypothyroidism: Secondary | ICD-10-CM | POA: Diagnosis not present

## 2016-01-06 DIAGNOSIS — G250 Essential tremor: Secondary | ICD-10-CM | POA: Diagnosis not present

## 2016-01-06 DIAGNOSIS — Z1389 Encounter for screening for other disorder: Secondary | ICD-10-CM | POA: Diagnosis not present

## 2016-01-06 DIAGNOSIS — I10 Essential (primary) hypertension: Secondary | ICD-10-CM | POA: Diagnosis not present

## 2016-07-11 DIAGNOSIS — I10 Essential (primary) hypertension: Secondary | ICD-10-CM | POA: Diagnosis not present

## 2016-07-11 DIAGNOSIS — I2699 Other pulmonary embolism without acute cor pulmonale: Secondary | ICD-10-CM | POA: Diagnosis not present

## 2016-07-11 DIAGNOSIS — L989 Disorder of the skin and subcutaneous tissue, unspecified: Secondary | ICD-10-CM | POA: Diagnosis not present

## 2016-07-11 DIAGNOSIS — E038 Other specified hypothyroidism: Secondary | ICD-10-CM | POA: Diagnosis not present

## 2016-07-11 DIAGNOSIS — Z0000 Encounter for general adult medical examination without abnormal findings: Secondary | ICD-10-CM | POA: Diagnosis not present

## 2016-07-11 DIAGNOSIS — E784 Other hyperlipidemia: Secondary | ICD-10-CM | POA: Diagnosis not present

## 2016-07-11 DIAGNOSIS — M1990 Unspecified osteoarthritis, unspecified site: Secondary | ICD-10-CM | POA: Diagnosis not present

## 2016-07-11 DIAGNOSIS — I639 Cerebral infarction, unspecified: Secondary | ICD-10-CM | POA: Diagnosis not present

## 2016-07-11 DIAGNOSIS — F329 Major depressive disorder, single episode, unspecified: Secondary | ICD-10-CM | POA: Diagnosis not present

## 2016-07-11 DIAGNOSIS — R531 Weakness: Secondary | ICD-10-CM | POA: Diagnosis not present

## 2016-07-11 DIAGNOSIS — Z6825 Body mass index (BMI) 25.0-25.9, adult: Secondary | ICD-10-CM | POA: Diagnosis not present

## 2016-07-11 DIAGNOSIS — G250 Essential tremor: Secondary | ICD-10-CM | POA: Diagnosis not present

## 2016-07-11 DIAGNOSIS — Z23 Encounter for immunization: Secondary | ICD-10-CM | POA: Diagnosis not present

## 2016-07-11 DIAGNOSIS — G309 Alzheimer's disease, unspecified: Secondary | ICD-10-CM | POA: Diagnosis not present

## 2016-09-27 DIAGNOSIS — I10 Essential (primary) hypertension: Secondary | ICD-10-CM | POA: Diagnosis not present

## 2016-09-27 DIAGNOSIS — R531 Weakness: Secondary | ICD-10-CM | POA: Diagnosis not present

## 2016-09-27 DIAGNOSIS — F329 Major depressive disorder, single episode, unspecified: Secondary | ICD-10-CM | POA: Diagnosis not present

## 2016-09-27 DIAGNOSIS — Z6825 Body mass index (BMI) 25.0-25.9, adult: Secondary | ICD-10-CM | POA: Diagnosis not present

## 2016-09-27 DIAGNOSIS — G250 Essential tremor: Secondary | ICD-10-CM | POA: Diagnosis not present

## 2016-09-27 DIAGNOSIS — I2699 Other pulmonary embolism without acute cor pulmonale: Secondary | ICD-10-CM | POA: Diagnosis not present

## 2016-09-27 DIAGNOSIS — I6200 Nontraumatic subdural hemorrhage, unspecified: Secondary | ICD-10-CM | POA: Diagnosis not present

## 2016-09-27 DIAGNOSIS — G309 Alzheimer's disease, unspecified: Secondary | ICD-10-CM | POA: Diagnosis not present

## 2016-09-27 DIAGNOSIS — G9389 Other specified disorders of brain: Secondary | ICD-10-CM | POA: Diagnosis not present

## 2016-09-27 DIAGNOSIS — L989 Disorder of the skin and subcutaneous tissue, unspecified: Secondary | ICD-10-CM | POA: Diagnosis not present

## 2016-09-27 DIAGNOSIS — I639 Cerebral infarction, unspecified: Secondary | ICD-10-CM | POA: Diagnosis not present

## 2016-09-27 DIAGNOSIS — R0602 Shortness of breath: Secondary | ICD-10-CM | POA: Diagnosis not present

## 2016-09-27 DIAGNOSIS — R627 Adult failure to thrive: Secondary | ICD-10-CM | POA: Diagnosis not present

## 2016-09-28 DIAGNOSIS — G309 Alzheimer's disease, unspecified: Secondary | ICD-10-CM | POA: Diagnosis not present

## 2016-09-28 DIAGNOSIS — E039 Hypothyroidism, unspecified: Secondary | ICD-10-CM | POA: Diagnosis not present

## 2016-09-28 DIAGNOSIS — F329 Major depressive disorder, single episode, unspecified: Secondary | ICD-10-CM | POA: Diagnosis not present

## 2016-09-28 DIAGNOSIS — M1990 Unspecified osteoarthritis, unspecified site: Secondary | ICD-10-CM | POA: Diagnosis not present

## 2016-09-28 DIAGNOSIS — I10 Essential (primary) hypertension: Secondary | ICD-10-CM | POA: Diagnosis not present

## 2016-09-28 DIAGNOSIS — I639 Cerebral infarction, unspecified: Secondary | ICD-10-CM | POA: Diagnosis not present

## 2016-09-28 DIAGNOSIS — E785 Hyperlipidemia, unspecified: Secondary | ICD-10-CM | POA: Diagnosis not present

## 2016-09-28 DIAGNOSIS — R531 Weakness: Secondary | ICD-10-CM | POA: Diagnosis not present

## 2016-09-28 DIAGNOSIS — E46 Unspecified protein-calorie malnutrition: Secondary | ICD-10-CM | POA: Diagnosis not present

## 2016-09-28 DIAGNOSIS — G250 Essential tremor: Secondary | ICD-10-CM | POA: Diagnosis not present

## 2016-10-01 DIAGNOSIS — E039 Hypothyroidism, unspecified: Secondary | ICD-10-CM | POA: Diagnosis not present

## 2016-10-01 DIAGNOSIS — F329 Major depressive disorder, single episode, unspecified: Secondary | ICD-10-CM | POA: Diagnosis not present

## 2016-10-01 DIAGNOSIS — E785 Hyperlipidemia, unspecified: Secondary | ICD-10-CM | POA: Diagnosis not present

## 2016-10-01 DIAGNOSIS — M1990 Unspecified osteoarthritis, unspecified site: Secondary | ICD-10-CM | POA: Diagnosis not present

## 2016-10-01 DIAGNOSIS — I10 Essential (primary) hypertension: Secondary | ICD-10-CM | POA: Diagnosis not present

## 2016-10-01 DIAGNOSIS — E46 Unspecified protein-calorie malnutrition: Secondary | ICD-10-CM | POA: Diagnosis not present

## 2016-10-03 DIAGNOSIS — E039 Hypothyroidism, unspecified: Secondary | ICD-10-CM | POA: Diagnosis not present

## 2016-10-03 DIAGNOSIS — E46 Unspecified protein-calorie malnutrition: Secondary | ICD-10-CM | POA: Diagnosis not present

## 2016-10-03 DIAGNOSIS — M1990 Unspecified osteoarthritis, unspecified site: Secondary | ICD-10-CM | POA: Diagnosis not present

## 2016-10-03 DIAGNOSIS — E785 Hyperlipidemia, unspecified: Secondary | ICD-10-CM | POA: Diagnosis not present

## 2016-10-03 DIAGNOSIS — I10 Essential (primary) hypertension: Secondary | ICD-10-CM | POA: Diagnosis not present

## 2016-10-03 DIAGNOSIS — F329 Major depressive disorder, single episode, unspecified: Secondary | ICD-10-CM | POA: Diagnosis not present

## 2016-10-05 DIAGNOSIS — I10 Essential (primary) hypertension: Secondary | ICD-10-CM | POA: Diagnosis not present

## 2016-10-05 DIAGNOSIS — E039 Hypothyroidism, unspecified: Secondary | ICD-10-CM | POA: Diagnosis not present

## 2016-10-05 DIAGNOSIS — E785 Hyperlipidemia, unspecified: Secondary | ICD-10-CM | POA: Diagnosis not present

## 2016-10-05 DIAGNOSIS — F329 Major depressive disorder, single episode, unspecified: Secondary | ICD-10-CM | POA: Diagnosis not present

## 2016-10-05 DIAGNOSIS — M1990 Unspecified osteoarthritis, unspecified site: Secondary | ICD-10-CM | POA: Diagnosis not present

## 2016-10-05 DIAGNOSIS — E46 Unspecified protein-calorie malnutrition: Secondary | ICD-10-CM | POA: Diagnosis not present

## 2016-10-08 DIAGNOSIS — E785 Hyperlipidemia, unspecified: Secondary | ICD-10-CM | POA: Diagnosis not present

## 2016-10-08 DIAGNOSIS — E039 Hypothyroidism, unspecified: Secondary | ICD-10-CM | POA: Diagnosis not present

## 2016-10-08 DIAGNOSIS — F329 Major depressive disorder, single episode, unspecified: Secondary | ICD-10-CM | POA: Diagnosis not present

## 2016-10-08 DIAGNOSIS — I10 Essential (primary) hypertension: Secondary | ICD-10-CM | POA: Diagnosis not present

## 2016-10-08 DIAGNOSIS — M1990 Unspecified osteoarthritis, unspecified site: Secondary | ICD-10-CM | POA: Diagnosis not present

## 2016-10-08 DIAGNOSIS — E46 Unspecified protein-calorie malnutrition: Secondary | ICD-10-CM | POA: Diagnosis not present

## 2016-10-09 DIAGNOSIS — I10 Essential (primary) hypertension: Secondary | ICD-10-CM | POA: Diagnosis not present

## 2016-10-09 DIAGNOSIS — F329 Major depressive disorder, single episode, unspecified: Secondary | ICD-10-CM | POA: Diagnosis not present

## 2016-10-09 DIAGNOSIS — E039 Hypothyroidism, unspecified: Secondary | ICD-10-CM | POA: Diagnosis not present

## 2016-10-09 DIAGNOSIS — E785 Hyperlipidemia, unspecified: Secondary | ICD-10-CM | POA: Diagnosis not present

## 2016-10-09 DIAGNOSIS — E46 Unspecified protein-calorie malnutrition: Secondary | ICD-10-CM | POA: Diagnosis not present

## 2016-10-09 DIAGNOSIS — M1990 Unspecified osteoarthritis, unspecified site: Secondary | ICD-10-CM | POA: Diagnosis not present

## 2016-10-10 DIAGNOSIS — E785 Hyperlipidemia, unspecified: Secondary | ICD-10-CM | POA: Diagnosis not present

## 2016-10-10 DIAGNOSIS — F329 Major depressive disorder, single episode, unspecified: Secondary | ICD-10-CM | POA: Diagnosis not present

## 2016-10-10 DIAGNOSIS — M1990 Unspecified osteoarthritis, unspecified site: Secondary | ICD-10-CM | POA: Diagnosis not present

## 2016-10-10 DIAGNOSIS — E46 Unspecified protein-calorie malnutrition: Secondary | ICD-10-CM | POA: Diagnosis not present

## 2016-10-10 DIAGNOSIS — E039 Hypothyroidism, unspecified: Secondary | ICD-10-CM | POA: Diagnosis not present

## 2016-10-10 DIAGNOSIS — I10 Essential (primary) hypertension: Secondary | ICD-10-CM | POA: Diagnosis not present

## 2016-10-11 DIAGNOSIS — E46 Unspecified protein-calorie malnutrition: Secondary | ICD-10-CM | POA: Diagnosis not present

## 2016-10-11 DIAGNOSIS — I10 Essential (primary) hypertension: Secondary | ICD-10-CM | POA: Diagnosis not present

## 2016-10-11 DIAGNOSIS — E039 Hypothyroidism, unspecified: Secondary | ICD-10-CM | POA: Diagnosis not present

## 2016-10-11 DIAGNOSIS — F329 Major depressive disorder, single episode, unspecified: Secondary | ICD-10-CM | POA: Diagnosis not present

## 2016-10-11 DIAGNOSIS — E785 Hyperlipidemia, unspecified: Secondary | ICD-10-CM | POA: Diagnosis not present

## 2016-10-11 DIAGNOSIS — M1990 Unspecified osteoarthritis, unspecified site: Secondary | ICD-10-CM | POA: Diagnosis not present

## 2016-10-12 DIAGNOSIS — E46 Unspecified protein-calorie malnutrition: Secondary | ICD-10-CM | POA: Diagnosis not present

## 2016-10-12 DIAGNOSIS — F329 Major depressive disorder, single episode, unspecified: Secondary | ICD-10-CM | POA: Diagnosis not present

## 2016-10-12 DIAGNOSIS — E785 Hyperlipidemia, unspecified: Secondary | ICD-10-CM | POA: Diagnosis not present

## 2016-10-12 DIAGNOSIS — E039 Hypothyroidism, unspecified: Secondary | ICD-10-CM | POA: Diagnosis not present

## 2016-10-12 DIAGNOSIS — I10 Essential (primary) hypertension: Secondary | ICD-10-CM | POA: Diagnosis not present

## 2016-10-12 DIAGNOSIS — M1990 Unspecified osteoarthritis, unspecified site: Secondary | ICD-10-CM | POA: Diagnosis not present

## 2016-10-15 DIAGNOSIS — E46 Unspecified protein-calorie malnutrition: Secondary | ICD-10-CM | POA: Diagnosis not present

## 2016-10-15 DIAGNOSIS — E785 Hyperlipidemia, unspecified: Secondary | ICD-10-CM | POA: Diagnosis not present

## 2016-10-15 DIAGNOSIS — E039 Hypothyroidism, unspecified: Secondary | ICD-10-CM | POA: Diagnosis not present

## 2016-10-15 DIAGNOSIS — I10 Essential (primary) hypertension: Secondary | ICD-10-CM | POA: Diagnosis not present

## 2016-10-15 DIAGNOSIS — F329 Major depressive disorder, single episode, unspecified: Secondary | ICD-10-CM | POA: Diagnosis not present

## 2016-10-15 DIAGNOSIS — M1990 Unspecified osteoarthritis, unspecified site: Secondary | ICD-10-CM | POA: Diagnosis not present

## 2016-10-16 DIAGNOSIS — E785 Hyperlipidemia, unspecified: Secondary | ICD-10-CM | POA: Diagnosis not present

## 2016-10-16 DIAGNOSIS — E46 Unspecified protein-calorie malnutrition: Secondary | ICD-10-CM | POA: Diagnosis not present

## 2016-10-16 DIAGNOSIS — I10 Essential (primary) hypertension: Secondary | ICD-10-CM | POA: Diagnosis not present

## 2016-10-16 DIAGNOSIS — E039 Hypothyroidism, unspecified: Secondary | ICD-10-CM | POA: Diagnosis not present

## 2016-10-16 DIAGNOSIS — M1990 Unspecified osteoarthritis, unspecified site: Secondary | ICD-10-CM | POA: Diagnosis not present

## 2016-10-16 DIAGNOSIS — F329 Major depressive disorder, single episode, unspecified: Secondary | ICD-10-CM | POA: Diagnosis not present

## 2016-10-17 DIAGNOSIS — E785 Hyperlipidemia, unspecified: Secondary | ICD-10-CM | POA: Diagnosis not present

## 2016-10-17 DIAGNOSIS — F329 Major depressive disorder, single episode, unspecified: Secondary | ICD-10-CM | POA: Diagnosis not present

## 2016-10-17 DIAGNOSIS — M1990 Unspecified osteoarthritis, unspecified site: Secondary | ICD-10-CM | POA: Diagnosis not present

## 2016-10-17 DIAGNOSIS — E46 Unspecified protein-calorie malnutrition: Secondary | ICD-10-CM | POA: Diagnosis not present

## 2016-10-17 DIAGNOSIS — I10 Essential (primary) hypertension: Secondary | ICD-10-CM | POA: Diagnosis not present

## 2016-10-17 DIAGNOSIS — E039 Hypothyroidism, unspecified: Secondary | ICD-10-CM | POA: Diagnosis not present

## 2016-10-18 DIAGNOSIS — I10 Essential (primary) hypertension: Secondary | ICD-10-CM | POA: Diagnosis not present

## 2016-10-18 DIAGNOSIS — F329 Major depressive disorder, single episode, unspecified: Secondary | ICD-10-CM | POA: Diagnosis not present

## 2016-10-18 DIAGNOSIS — E039 Hypothyroidism, unspecified: Secondary | ICD-10-CM | POA: Diagnosis not present

## 2016-10-18 DIAGNOSIS — M1990 Unspecified osteoarthritis, unspecified site: Secondary | ICD-10-CM | POA: Diagnosis not present

## 2016-10-18 DIAGNOSIS — E785 Hyperlipidemia, unspecified: Secondary | ICD-10-CM | POA: Diagnosis not present

## 2016-10-18 DIAGNOSIS — E46 Unspecified protein-calorie malnutrition: Secondary | ICD-10-CM | POA: Diagnosis not present

## 2016-10-19 DIAGNOSIS — F329 Major depressive disorder, single episode, unspecified: Secondary | ICD-10-CM | POA: Diagnosis not present

## 2016-10-19 DIAGNOSIS — M1990 Unspecified osteoarthritis, unspecified site: Secondary | ICD-10-CM | POA: Diagnosis not present

## 2016-10-19 DIAGNOSIS — E46 Unspecified protein-calorie malnutrition: Secondary | ICD-10-CM | POA: Diagnosis not present

## 2016-10-19 DIAGNOSIS — E039 Hypothyroidism, unspecified: Secondary | ICD-10-CM | POA: Diagnosis not present

## 2016-10-19 DIAGNOSIS — E785 Hyperlipidemia, unspecified: Secondary | ICD-10-CM | POA: Diagnosis not present

## 2016-10-19 DIAGNOSIS — I10 Essential (primary) hypertension: Secondary | ICD-10-CM | POA: Diagnosis not present

## 2016-10-24 DIAGNOSIS — E785 Hyperlipidemia, unspecified: Secondary | ICD-10-CM | POA: Diagnosis not present

## 2016-10-24 DIAGNOSIS — I10 Essential (primary) hypertension: Secondary | ICD-10-CM | POA: Diagnosis not present

## 2016-10-24 DIAGNOSIS — F329 Major depressive disorder, single episode, unspecified: Secondary | ICD-10-CM | POA: Diagnosis not present

## 2016-10-24 DIAGNOSIS — E46 Unspecified protein-calorie malnutrition: Secondary | ICD-10-CM | POA: Diagnosis not present

## 2016-10-24 DIAGNOSIS — M1990 Unspecified osteoarthritis, unspecified site: Secondary | ICD-10-CM | POA: Diagnosis not present

## 2016-10-24 DIAGNOSIS — E039 Hypothyroidism, unspecified: Secondary | ICD-10-CM | POA: Diagnosis not present

## 2016-10-25 DIAGNOSIS — I10 Essential (primary) hypertension: Secondary | ICD-10-CM | POA: Diagnosis not present

## 2016-10-25 DIAGNOSIS — M1990 Unspecified osteoarthritis, unspecified site: Secondary | ICD-10-CM | POA: Diagnosis not present

## 2016-10-25 DIAGNOSIS — E785 Hyperlipidemia, unspecified: Secondary | ICD-10-CM | POA: Diagnosis not present

## 2016-10-25 DIAGNOSIS — F329 Major depressive disorder, single episode, unspecified: Secondary | ICD-10-CM | POA: Diagnosis not present

## 2016-10-25 DIAGNOSIS — E039 Hypothyroidism, unspecified: Secondary | ICD-10-CM | POA: Diagnosis not present

## 2016-10-25 DIAGNOSIS — E46 Unspecified protein-calorie malnutrition: Secondary | ICD-10-CM | POA: Diagnosis not present

## 2016-10-26 DIAGNOSIS — M1990 Unspecified osteoarthritis, unspecified site: Secondary | ICD-10-CM | POA: Diagnosis not present

## 2016-10-26 DIAGNOSIS — E46 Unspecified protein-calorie malnutrition: Secondary | ICD-10-CM | POA: Diagnosis not present

## 2016-10-26 DIAGNOSIS — I10 Essential (primary) hypertension: Secondary | ICD-10-CM | POA: Diagnosis not present

## 2016-10-26 DIAGNOSIS — F329 Major depressive disorder, single episode, unspecified: Secondary | ICD-10-CM | POA: Diagnosis not present

## 2016-10-26 DIAGNOSIS — E039 Hypothyroidism, unspecified: Secondary | ICD-10-CM | POA: Diagnosis not present

## 2016-10-26 DIAGNOSIS — E785 Hyperlipidemia, unspecified: Secondary | ICD-10-CM | POA: Diagnosis not present

## 2016-10-29 DIAGNOSIS — F329 Major depressive disorder, single episode, unspecified: Secondary | ICD-10-CM | POA: Diagnosis not present

## 2016-10-29 DIAGNOSIS — I639 Cerebral infarction, unspecified: Secondary | ICD-10-CM | POA: Diagnosis not present

## 2016-10-29 DIAGNOSIS — M1990 Unspecified osteoarthritis, unspecified site: Secondary | ICD-10-CM | POA: Diagnosis not present

## 2016-10-29 DIAGNOSIS — I10 Essential (primary) hypertension: Secondary | ICD-10-CM | POA: Diagnosis not present

## 2016-10-29 DIAGNOSIS — E46 Unspecified protein-calorie malnutrition: Secondary | ICD-10-CM | POA: Diagnosis not present

## 2016-10-29 DIAGNOSIS — E785 Hyperlipidemia, unspecified: Secondary | ICD-10-CM | POA: Diagnosis not present

## 2016-10-29 DIAGNOSIS — G250 Essential tremor: Secondary | ICD-10-CM | POA: Diagnosis not present

## 2016-10-29 DIAGNOSIS — G309 Alzheimer's disease, unspecified: Secondary | ICD-10-CM | POA: Diagnosis not present

## 2016-10-29 DIAGNOSIS — E039 Hypothyroidism, unspecified: Secondary | ICD-10-CM | POA: Diagnosis not present

## 2016-10-29 DIAGNOSIS — R531 Weakness: Secondary | ICD-10-CM | POA: Diagnosis not present

## 2016-10-30 DIAGNOSIS — E46 Unspecified protein-calorie malnutrition: Secondary | ICD-10-CM | POA: Diagnosis not present

## 2016-10-30 DIAGNOSIS — F329 Major depressive disorder, single episode, unspecified: Secondary | ICD-10-CM | POA: Diagnosis not present

## 2016-10-30 DIAGNOSIS — E039 Hypothyroidism, unspecified: Secondary | ICD-10-CM | POA: Diagnosis not present

## 2016-10-30 DIAGNOSIS — I10 Essential (primary) hypertension: Secondary | ICD-10-CM | POA: Diagnosis not present

## 2016-10-30 DIAGNOSIS — E785 Hyperlipidemia, unspecified: Secondary | ICD-10-CM | POA: Diagnosis not present

## 2016-10-30 DIAGNOSIS — M1990 Unspecified osteoarthritis, unspecified site: Secondary | ICD-10-CM | POA: Diagnosis not present

## 2016-11-01 DIAGNOSIS — I10 Essential (primary) hypertension: Secondary | ICD-10-CM | POA: Diagnosis not present

## 2016-11-01 DIAGNOSIS — E039 Hypothyroidism, unspecified: Secondary | ICD-10-CM | POA: Diagnosis not present

## 2016-11-01 DIAGNOSIS — E785 Hyperlipidemia, unspecified: Secondary | ICD-10-CM | POA: Diagnosis not present

## 2016-11-01 DIAGNOSIS — F329 Major depressive disorder, single episode, unspecified: Secondary | ICD-10-CM | POA: Diagnosis not present

## 2016-11-01 DIAGNOSIS — E46 Unspecified protein-calorie malnutrition: Secondary | ICD-10-CM | POA: Diagnosis not present

## 2016-11-01 DIAGNOSIS — M1990 Unspecified osteoarthritis, unspecified site: Secondary | ICD-10-CM | POA: Diagnosis not present

## 2016-11-02 DIAGNOSIS — F329 Major depressive disorder, single episode, unspecified: Secondary | ICD-10-CM | POA: Diagnosis not present

## 2016-11-02 DIAGNOSIS — M1990 Unspecified osteoarthritis, unspecified site: Secondary | ICD-10-CM | POA: Diagnosis not present

## 2016-11-02 DIAGNOSIS — E46 Unspecified protein-calorie malnutrition: Secondary | ICD-10-CM | POA: Diagnosis not present

## 2016-11-02 DIAGNOSIS — E785 Hyperlipidemia, unspecified: Secondary | ICD-10-CM | POA: Diagnosis not present

## 2016-11-02 DIAGNOSIS — I10 Essential (primary) hypertension: Secondary | ICD-10-CM | POA: Diagnosis not present

## 2016-11-02 DIAGNOSIS — E039 Hypothyroidism, unspecified: Secondary | ICD-10-CM | POA: Diagnosis not present

## 2016-11-07 DIAGNOSIS — I10 Essential (primary) hypertension: Secondary | ICD-10-CM | POA: Diagnosis not present

## 2016-11-07 DIAGNOSIS — E46 Unspecified protein-calorie malnutrition: Secondary | ICD-10-CM | POA: Diagnosis not present

## 2016-11-07 DIAGNOSIS — E785 Hyperlipidemia, unspecified: Secondary | ICD-10-CM | POA: Diagnosis not present

## 2016-11-07 DIAGNOSIS — E039 Hypothyroidism, unspecified: Secondary | ICD-10-CM | POA: Diagnosis not present

## 2016-11-07 DIAGNOSIS — M1990 Unspecified osteoarthritis, unspecified site: Secondary | ICD-10-CM | POA: Diagnosis not present

## 2016-11-07 DIAGNOSIS — F329 Major depressive disorder, single episode, unspecified: Secondary | ICD-10-CM | POA: Diagnosis not present

## 2016-11-08 DIAGNOSIS — M1990 Unspecified osteoarthritis, unspecified site: Secondary | ICD-10-CM | POA: Diagnosis not present

## 2016-11-08 DIAGNOSIS — I10 Essential (primary) hypertension: Secondary | ICD-10-CM | POA: Diagnosis not present

## 2016-11-08 DIAGNOSIS — E785 Hyperlipidemia, unspecified: Secondary | ICD-10-CM | POA: Diagnosis not present

## 2016-11-08 DIAGNOSIS — E46 Unspecified protein-calorie malnutrition: Secondary | ICD-10-CM | POA: Diagnosis not present

## 2016-11-08 DIAGNOSIS — E039 Hypothyroidism, unspecified: Secondary | ICD-10-CM | POA: Diagnosis not present

## 2016-11-08 DIAGNOSIS — F329 Major depressive disorder, single episode, unspecified: Secondary | ICD-10-CM | POA: Diagnosis not present

## 2016-11-09 DIAGNOSIS — E039 Hypothyroidism, unspecified: Secondary | ICD-10-CM | POA: Diagnosis not present

## 2016-11-09 DIAGNOSIS — E46 Unspecified protein-calorie malnutrition: Secondary | ICD-10-CM | POA: Diagnosis not present

## 2016-11-09 DIAGNOSIS — E785 Hyperlipidemia, unspecified: Secondary | ICD-10-CM | POA: Diagnosis not present

## 2016-11-09 DIAGNOSIS — F329 Major depressive disorder, single episode, unspecified: Secondary | ICD-10-CM | POA: Diagnosis not present

## 2016-11-09 DIAGNOSIS — I10 Essential (primary) hypertension: Secondary | ICD-10-CM | POA: Diagnosis not present

## 2016-11-09 DIAGNOSIS — M1990 Unspecified osteoarthritis, unspecified site: Secondary | ICD-10-CM | POA: Diagnosis not present

## 2016-11-12 DIAGNOSIS — E039 Hypothyroidism, unspecified: Secondary | ICD-10-CM | POA: Diagnosis not present

## 2016-11-12 DIAGNOSIS — E46 Unspecified protein-calorie malnutrition: Secondary | ICD-10-CM | POA: Diagnosis not present

## 2016-11-12 DIAGNOSIS — E785 Hyperlipidemia, unspecified: Secondary | ICD-10-CM | POA: Diagnosis not present

## 2016-11-12 DIAGNOSIS — F329 Major depressive disorder, single episode, unspecified: Secondary | ICD-10-CM | POA: Diagnosis not present

## 2016-11-12 DIAGNOSIS — M1990 Unspecified osteoarthritis, unspecified site: Secondary | ICD-10-CM | POA: Diagnosis not present

## 2016-11-12 DIAGNOSIS — I10 Essential (primary) hypertension: Secondary | ICD-10-CM | POA: Diagnosis not present

## 2016-11-13 DIAGNOSIS — I10 Essential (primary) hypertension: Secondary | ICD-10-CM | POA: Diagnosis not present

## 2016-11-13 DIAGNOSIS — E785 Hyperlipidemia, unspecified: Secondary | ICD-10-CM | POA: Diagnosis not present

## 2016-11-13 DIAGNOSIS — E46 Unspecified protein-calorie malnutrition: Secondary | ICD-10-CM | POA: Diagnosis not present

## 2016-11-13 DIAGNOSIS — E039 Hypothyroidism, unspecified: Secondary | ICD-10-CM | POA: Diagnosis not present

## 2016-11-13 DIAGNOSIS — F329 Major depressive disorder, single episode, unspecified: Secondary | ICD-10-CM | POA: Diagnosis not present

## 2016-11-13 DIAGNOSIS — M1990 Unspecified osteoarthritis, unspecified site: Secondary | ICD-10-CM | POA: Diagnosis not present

## 2016-11-19 DIAGNOSIS — E039 Hypothyroidism, unspecified: Secondary | ICD-10-CM | POA: Diagnosis not present

## 2016-11-19 DIAGNOSIS — F329 Major depressive disorder, single episode, unspecified: Secondary | ICD-10-CM | POA: Diagnosis not present

## 2016-11-19 DIAGNOSIS — M1990 Unspecified osteoarthritis, unspecified site: Secondary | ICD-10-CM | POA: Diagnosis not present

## 2016-11-19 DIAGNOSIS — I10 Essential (primary) hypertension: Secondary | ICD-10-CM | POA: Diagnosis not present

## 2016-11-19 DIAGNOSIS — E785 Hyperlipidemia, unspecified: Secondary | ICD-10-CM | POA: Diagnosis not present

## 2016-11-19 DIAGNOSIS — E46 Unspecified protein-calorie malnutrition: Secondary | ICD-10-CM | POA: Diagnosis not present

## 2016-11-20 DIAGNOSIS — E46 Unspecified protein-calorie malnutrition: Secondary | ICD-10-CM | POA: Diagnosis not present

## 2016-11-20 DIAGNOSIS — F329 Major depressive disorder, single episode, unspecified: Secondary | ICD-10-CM | POA: Diagnosis not present

## 2016-11-20 DIAGNOSIS — E785 Hyperlipidemia, unspecified: Secondary | ICD-10-CM | POA: Diagnosis not present

## 2016-11-20 DIAGNOSIS — I10 Essential (primary) hypertension: Secondary | ICD-10-CM | POA: Diagnosis not present

## 2016-11-20 DIAGNOSIS — E039 Hypothyroidism, unspecified: Secondary | ICD-10-CM | POA: Diagnosis not present

## 2016-11-20 DIAGNOSIS — M1990 Unspecified osteoarthritis, unspecified site: Secondary | ICD-10-CM | POA: Diagnosis not present

## 2016-11-21 DIAGNOSIS — E039 Hypothyroidism, unspecified: Secondary | ICD-10-CM | POA: Diagnosis not present

## 2016-11-21 DIAGNOSIS — E785 Hyperlipidemia, unspecified: Secondary | ICD-10-CM | POA: Diagnosis not present

## 2016-11-21 DIAGNOSIS — I10 Essential (primary) hypertension: Secondary | ICD-10-CM | POA: Diagnosis not present

## 2016-11-21 DIAGNOSIS — E46 Unspecified protein-calorie malnutrition: Secondary | ICD-10-CM | POA: Diagnosis not present

## 2016-11-21 DIAGNOSIS — M1990 Unspecified osteoarthritis, unspecified site: Secondary | ICD-10-CM | POA: Diagnosis not present

## 2016-11-21 DIAGNOSIS — F329 Major depressive disorder, single episode, unspecified: Secondary | ICD-10-CM | POA: Diagnosis not present

## 2016-11-23 DIAGNOSIS — F329 Major depressive disorder, single episode, unspecified: Secondary | ICD-10-CM | POA: Diagnosis not present

## 2016-11-23 DIAGNOSIS — E785 Hyperlipidemia, unspecified: Secondary | ICD-10-CM | POA: Diagnosis not present

## 2016-11-23 DIAGNOSIS — E46 Unspecified protein-calorie malnutrition: Secondary | ICD-10-CM | POA: Diagnosis not present

## 2016-11-23 DIAGNOSIS — E039 Hypothyroidism, unspecified: Secondary | ICD-10-CM | POA: Diagnosis not present

## 2016-11-23 DIAGNOSIS — M1990 Unspecified osteoarthritis, unspecified site: Secondary | ICD-10-CM | POA: Diagnosis not present

## 2016-11-23 DIAGNOSIS — I10 Essential (primary) hypertension: Secondary | ICD-10-CM | POA: Diagnosis not present

## 2016-11-26 DIAGNOSIS — M1990 Unspecified osteoarthritis, unspecified site: Secondary | ICD-10-CM | POA: Diagnosis not present

## 2016-11-26 DIAGNOSIS — E785 Hyperlipidemia, unspecified: Secondary | ICD-10-CM | POA: Diagnosis not present

## 2016-11-26 DIAGNOSIS — F329 Major depressive disorder, single episode, unspecified: Secondary | ICD-10-CM | POA: Diagnosis not present

## 2016-11-26 DIAGNOSIS — E039 Hypothyroidism, unspecified: Secondary | ICD-10-CM | POA: Diagnosis not present

## 2016-11-26 DIAGNOSIS — E46 Unspecified protein-calorie malnutrition: Secondary | ICD-10-CM | POA: Diagnosis not present

## 2016-11-26 DIAGNOSIS — I10 Essential (primary) hypertension: Secondary | ICD-10-CM | POA: Diagnosis not present

## 2016-11-28 DIAGNOSIS — I10 Essential (primary) hypertension: Secondary | ICD-10-CM | POA: Diagnosis not present

## 2016-11-28 DIAGNOSIS — E039 Hypothyroidism, unspecified: Secondary | ICD-10-CM | POA: Diagnosis not present

## 2016-11-28 DIAGNOSIS — E46 Unspecified protein-calorie malnutrition: Secondary | ICD-10-CM | POA: Diagnosis not present

## 2016-11-28 DIAGNOSIS — M1990 Unspecified osteoarthritis, unspecified site: Secondary | ICD-10-CM | POA: Diagnosis not present

## 2016-11-28 DIAGNOSIS — F329 Major depressive disorder, single episode, unspecified: Secondary | ICD-10-CM | POA: Diagnosis not present

## 2016-11-28 DIAGNOSIS — E785 Hyperlipidemia, unspecified: Secondary | ICD-10-CM | POA: Diagnosis not present

## 2016-11-29 DIAGNOSIS — M1990 Unspecified osteoarthritis, unspecified site: Secondary | ICD-10-CM | POA: Diagnosis not present

## 2016-11-29 DIAGNOSIS — E785 Hyperlipidemia, unspecified: Secondary | ICD-10-CM | POA: Diagnosis not present

## 2016-11-29 DIAGNOSIS — E039 Hypothyroidism, unspecified: Secondary | ICD-10-CM | POA: Diagnosis not present

## 2016-11-29 DIAGNOSIS — I639 Cerebral infarction, unspecified: Secondary | ICD-10-CM | POA: Diagnosis not present

## 2016-11-29 DIAGNOSIS — F329 Major depressive disorder, single episode, unspecified: Secondary | ICD-10-CM | POA: Diagnosis not present

## 2016-11-29 DIAGNOSIS — I10 Essential (primary) hypertension: Secondary | ICD-10-CM | POA: Diagnosis not present

## 2016-11-29 DIAGNOSIS — R531 Weakness: Secondary | ICD-10-CM | POA: Diagnosis not present

## 2016-11-29 DIAGNOSIS — G250 Essential tremor: Secondary | ICD-10-CM | POA: Diagnosis not present

## 2016-11-29 DIAGNOSIS — E46 Unspecified protein-calorie malnutrition: Secondary | ICD-10-CM | POA: Diagnosis not present

## 2016-11-29 DIAGNOSIS — G309 Alzheimer's disease, unspecified: Secondary | ICD-10-CM | POA: Diagnosis not present

## 2016-11-30 DIAGNOSIS — E039 Hypothyroidism, unspecified: Secondary | ICD-10-CM | POA: Diagnosis not present

## 2016-11-30 DIAGNOSIS — E46 Unspecified protein-calorie malnutrition: Secondary | ICD-10-CM | POA: Diagnosis not present

## 2016-11-30 DIAGNOSIS — M1990 Unspecified osteoarthritis, unspecified site: Secondary | ICD-10-CM | POA: Diagnosis not present

## 2016-11-30 DIAGNOSIS — I10 Essential (primary) hypertension: Secondary | ICD-10-CM | POA: Diagnosis not present

## 2016-11-30 DIAGNOSIS — E785 Hyperlipidemia, unspecified: Secondary | ICD-10-CM | POA: Diagnosis not present

## 2016-11-30 DIAGNOSIS — F329 Major depressive disorder, single episode, unspecified: Secondary | ICD-10-CM | POA: Diagnosis not present

## 2016-12-04 DIAGNOSIS — I10 Essential (primary) hypertension: Secondary | ICD-10-CM | POA: Diagnosis not present

## 2016-12-04 DIAGNOSIS — F329 Major depressive disorder, single episode, unspecified: Secondary | ICD-10-CM | POA: Diagnosis not present

## 2016-12-04 DIAGNOSIS — E785 Hyperlipidemia, unspecified: Secondary | ICD-10-CM | POA: Diagnosis not present

## 2016-12-04 DIAGNOSIS — E039 Hypothyroidism, unspecified: Secondary | ICD-10-CM | POA: Diagnosis not present

## 2016-12-04 DIAGNOSIS — M1990 Unspecified osteoarthritis, unspecified site: Secondary | ICD-10-CM | POA: Diagnosis not present

## 2016-12-04 DIAGNOSIS — E46 Unspecified protein-calorie malnutrition: Secondary | ICD-10-CM | POA: Diagnosis not present

## 2016-12-05 DIAGNOSIS — E039 Hypothyroidism, unspecified: Secondary | ICD-10-CM | POA: Diagnosis not present

## 2016-12-05 DIAGNOSIS — E785 Hyperlipidemia, unspecified: Secondary | ICD-10-CM | POA: Diagnosis not present

## 2016-12-05 DIAGNOSIS — E46 Unspecified protein-calorie malnutrition: Secondary | ICD-10-CM | POA: Diagnosis not present

## 2016-12-05 DIAGNOSIS — I10 Essential (primary) hypertension: Secondary | ICD-10-CM | POA: Diagnosis not present

## 2016-12-05 DIAGNOSIS — M1990 Unspecified osteoarthritis, unspecified site: Secondary | ICD-10-CM | POA: Diagnosis not present

## 2016-12-05 DIAGNOSIS — F329 Major depressive disorder, single episode, unspecified: Secondary | ICD-10-CM | POA: Diagnosis not present

## 2016-12-06 DIAGNOSIS — E785 Hyperlipidemia, unspecified: Secondary | ICD-10-CM | POA: Diagnosis not present

## 2016-12-06 DIAGNOSIS — M1990 Unspecified osteoarthritis, unspecified site: Secondary | ICD-10-CM | POA: Diagnosis not present

## 2016-12-06 DIAGNOSIS — F329 Major depressive disorder, single episode, unspecified: Secondary | ICD-10-CM | POA: Diagnosis not present

## 2016-12-06 DIAGNOSIS — E039 Hypothyroidism, unspecified: Secondary | ICD-10-CM | POA: Diagnosis not present

## 2016-12-06 DIAGNOSIS — I10 Essential (primary) hypertension: Secondary | ICD-10-CM | POA: Diagnosis not present

## 2016-12-06 DIAGNOSIS — E46 Unspecified protein-calorie malnutrition: Secondary | ICD-10-CM | POA: Diagnosis not present

## 2016-12-07 DIAGNOSIS — M1990 Unspecified osteoarthritis, unspecified site: Secondary | ICD-10-CM | POA: Diagnosis not present

## 2016-12-07 DIAGNOSIS — E039 Hypothyroidism, unspecified: Secondary | ICD-10-CM | POA: Diagnosis not present

## 2016-12-07 DIAGNOSIS — F329 Major depressive disorder, single episode, unspecified: Secondary | ICD-10-CM | POA: Diagnosis not present

## 2016-12-07 DIAGNOSIS — I10 Essential (primary) hypertension: Secondary | ICD-10-CM | POA: Diagnosis not present

## 2016-12-07 DIAGNOSIS — E785 Hyperlipidemia, unspecified: Secondary | ICD-10-CM | POA: Diagnosis not present

## 2016-12-07 DIAGNOSIS — E46 Unspecified protein-calorie malnutrition: Secondary | ICD-10-CM | POA: Diagnosis not present

## 2016-12-10 DIAGNOSIS — E039 Hypothyroidism, unspecified: Secondary | ICD-10-CM | POA: Diagnosis not present

## 2016-12-10 DIAGNOSIS — E46 Unspecified protein-calorie malnutrition: Secondary | ICD-10-CM | POA: Diagnosis not present

## 2016-12-10 DIAGNOSIS — M1990 Unspecified osteoarthritis, unspecified site: Secondary | ICD-10-CM | POA: Diagnosis not present

## 2016-12-10 DIAGNOSIS — I10 Essential (primary) hypertension: Secondary | ICD-10-CM | POA: Diagnosis not present

## 2016-12-10 DIAGNOSIS — F329 Major depressive disorder, single episode, unspecified: Secondary | ICD-10-CM | POA: Diagnosis not present

## 2016-12-10 DIAGNOSIS — E785 Hyperlipidemia, unspecified: Secondary | ICD-10-CM | POA: Diagnosis not present

## 2016-12-11 DIAGNOSIS — M1990 Unspecified osteoarthritis, unspecified site: Secondary | ICD-10-CM | POA: Diagnosis not present

## 2016-12-11 DIAGNOSIS — I10 Essential (primary) hypertension: Secondary | ICD-10-CM | POA: Diagnosis not present

## 2016-12-11 DIAGNOSIS — F329 Major depressive disorder, single episode, unspecified: Secondary | ICD-10-CM | POA: Diagnosis not present

## 2016-12-11 DIAGNOSIS — E785 Hyperlipidemia, unspecified: Secondary | ICD-10-CM | POA: Diagnosis not present

## 2016-12-11 DIAGNOSIS — E039 Hypothyroidism, unspecified: Secondary | ICD-10-CM | POA: Diagnosis not present

## 2016-12-11 DIAGNOSIS — E46 Unspecified protein-calorie malnutrition: Secondary | ICD-10-CM | POA: Diagnosis not present

## 2016-12-13 DIAGNOSIS — E039 Hypothyroidism, unspecified: Secondary | ICD-10-CM | POA: Diagnosis not present

## 2016-12-13 DIAGNOSIS — E785 Hyperlipidemia, unspecified: Secondary | ICD-10-CM | POA: Diagnosis not present

## 2016-12-13 DIAGNOSIS — I10 Essential (primary) hypertension: Secondary | ICD-10-CM | POA: Diagnosis not present

## 2016-12-13 DIAGNOSIS — F329 Major depressive disorder, single episode, unspecified: Secondary | ICD-10-CM | POA: Diagnosis not present

## 2016-12-13 DIAGNOSIS — M1990 Unspecified osteoarthritis, unspecified site: Secondary | ICD-10-CM | POA: Diagnosis not present

## 2016-12-13 DIAGNOSIS — E46 Unspecified protein-calorie malnutrition: Secondary | ICD-10-CM | POA: Diagnosis not present

## 2016-12-14 DIAGNOSIS — E785 Hyperlipidemia, unspecified: Secondary | ICD-10-CM | POA: Diagnosis not present

## 2016-12-14 DIAGNOSIS — E039 Hypothyroidism, unspecified: Secondary | ICD-10-CM | POA: Diagnosis not present

## 2016-12-14 DIAGNOSIS — M1990 Unspecified osteoarthritis, unspecified site: Secondary | ICD-10-CM | POA: Diagnosis not present

## 2016-12-14 DIAGNOSIS — E46 Unspecified protein-calorie malnutrition: Secondary | ICD-10-CM | POA: Diagnosis not present

## 2016-12-14 DIAGNOSIS — F329 Major depressive disorder, single episode, unspecified: Secondary | ICD-10-CM | POA: Diagnosis not present

## 2016-12-14 DIAGNOSIS — I10 Essential (primary) hypertension: Secondary | ICD-10-CM | POA: Diagnosis not present

## 2016-12-18 DIAGNOSIS — E46 Unspecified protein-calorie malnutrition: Secondary | ICD-10-CM | POA: Diagnosis not present

## 2016-12-18 DIAGNOSIS — I10 Essential (primary) hypertension: Secondary | ICD-10-CM | POA: Diagnosis not present

## 2016-12-18 DIAGNOSIS — E785 Hyperlipidemia, unspecified: Secondary | ICD-10-CM | POA: Diagnosis not present

## 2016-12-18 DIAGNOSIS — F329 Major depressive disorder, single episode, unspecified: Secondary | ICD-10-CM | POA: Diagnosis not present

## 2016-12-18 DIAGNOSIS — M1990 Unspecified osteoarthritis, unspecified site: Secondary | ICD-10-CM | POA: Diagnosis not present

## 2016-12-18 DIAGNOSIS — E039 Hypothyroidism, unspecified: Secondary | ICD-10-CM | POA: Diagnosis not present

## 2016-12-20 DIAGNOSIS — E785 Hyperlipidemia, unspecified: Secondary | ICD-10-CM | POA: Diagnosis not present

## 2016-12-20 DIAGNOSIS — M1990 Unspecified osteoarthritis, unspecified site: Secondary | ICD-10-CM | POA: Diagnosis not present

## 2016-12-20 DIAGNOSIS — F329 Major depressive disorder, single episode, unspecified: Secondary | ICD-10-CM | POA: Diagnosis not present

## 2016-12-20 DIAGNOSIS — I10 Essential (primary) hypertension: Secondary | ICD-10-CM | POA: Diagnosis not present

## 2016-12-20 DIAGNOSIS — E46 Unspecified protein-calorie malnutrition: Secondary | ICD-10-CM | POA: Diagnosis not present

## 2016-12-20 DIAGNOSIS — E039 Hypothyroidism, unspecified: Secondary | ICD-10-CM | POA: Diagnosis not present

## 2016-12-21 DIAGNOSIS — E46 Unspecified protein-calorie malnutrition: Secondary | ICD-10-CM | POA: Diagnosis not present

## 2016-12-21 DIAGNOSIS — M1990 Unspecified osteoarthritis, unspecified site: Secondary | ICD-10-CM | POA: Diagnosis not present

## 2016-12-21 DIAGNOSIS — E785 Hyperlipidemia, unspecified: Secondary | ICD-10-CM | POA: Diagnosis not present

## 2016-12-21 DIAGNOSIS — E039 Hypothyroidism, unspecified: Secondary | ICD-10-CM | POA: Diagnosis not present

## 2016-12-21 DIAGNOSIS — F329 Major depressive disorder, single episode, unspecified: Secondary | ICD-10-CM | POA: Diagnosis not present

## 2016-12-21 DIAGNOSIS — I10 Essential (primary) hypertension: Secondary | ICD-10-CM | POA: Diagnosis not present

## 2016-12-25 DIAGNOSIS — F329 Major depressive disorder, single episode, unspecified: Secondary | ICD-10-CM | POA: Diagnosis not present

## 2016-12-25 DIAGNOSIS — I10 Essential (primary) hypertension: Secondary | ICD-10-CM | POA: Diagnosis not present

## 2016-12-25 DIAGNOSIS — E46 Unspecified protein-calorie malnutrition: Secondary | ICD-10-CM | POA: Diagnosis not present

## 2016-12-25 DIAGNOSIS — E785 Hyperlipidemia, unspecified: Secondary | ICD-10-CM | POA: Diagnosis not present

## 2016-12-25 DIAGNOSIS — E039 Hypothyroidism, unspecified: Secondary | ICD-10-CM | POA: Diagnosis not present

## 2016-12-25 DIAGNOSIS — M1990 Unspecified osteoarthritis, unspecified site: Secondary | ICD-10-CM | POA: Diagnosis not present

## 2016-12-27 DIAGNOSIS — E785 Hyperlipidemia, unspecified: Secondary | ICD-10-CM | POA: Diagnosis not present

## 2016-12-27 DIAGNOSIS — E039 Hypothyroidism, unspecified: Secondary | ICD-10-CM | POA: Diagnosis not present

## 2016-12-27 DIAGNOSIS — F3289 Other specified depressive episodes: Secondary | ICD-10-CM | POA: Diagnosis not present

## 2016-12-27 DIAGNOSIS — I10 Essential (primary) hypertension: Secondary | ICD-10-CM | POA: Diagnosis not present

## 2016-12-27 DIAGNOSIS — G309 Alzheimer's disease, unspecified: Secondary | ICD-10-CM | POA: Diagnosis not present

## 2016-12-27 DIAGNOSIS — M1990 Unspecified osteoarthritis, unspecified site: Secondary | ICD-10-CM | POA: Diagnosis not present

## 2016-12-27 DIAGNOSIS — I639 Cerebral infarction, unspecified: Secondary | ICD-10-CM | POA: Diagnosis not present

## 2016-12-27 DIAGNOSIS — E46 Unspecified protein-calorie malnutrition: Secondary | ICD-10-CM | POA: Diagnosis not present

## 2016-12-27 DIAGNOSIS — E038 Other specified hypothyroidism: Secondary | ICD-10-CM | POA: Diagnosis not present

## 2016-12-27 DIAGNOSIS — G308 Other Alzheimer's disease: Secondary | ICD-10-CM | POA: Diagnosis not present

## 2016-12-27 DIAGNOSIS — F329 Major depressive disorder, single episode, unspecified: Secondary | ICD-10-CM | POA: Diagnosis not present

## 2016-12-27 DIAGNOSIS — E784 Other hyperlipidemia: Secondary | ICD-10-CM | POA: Diagnosis not present

## 2016-12-27 DIAGNOSIS — I638 Other cerebral infarction: Secondary | ICD-10-CM | POA: Diagnosis not present

## 2016-12-27 DIAGNOSIS — R531 Weakness: Secondary | ICD-10-CM | POA: Diagnosis not present

## 2016-12-27 DIAGNOSIS — G250 Essential tremor: Secondary | ICD-10-CM | POA: Diagnosis not present

## 2016-12-28 DIAGNOSIS — E46 Unspecified protein-calorie malnutrition: Secondary | ICD-10-CM | POA: Diagnosis not present

## 2016-12-28 DIAGNOSIS — M1990 Unspecified osteoarthritis, unspecified site: Secondary | ICD-10-CM | POA: Diagnosis not present

## 2016-12-28 DIAGNOSIS — E039 Hypothyroidism, unspecified: Secondary | ICD-10-CM | POA: Diagnosis not present

## 2016-12-28 DIAGNOSIS — I10 Essential (primary) hypertension: Secondary | ICD-10-CM | POA: Diagnosis not present

## 2016-12-28 DIAGNOSIS — E785 Hyperlipidemia, unspecified: Secondary | ICD-10-CM | POA: Diagnosis not present

## 2016-12-28 DIAGNOSIS — F329 Major depressive disorder, single episode, unspecified: Secondary | ICD-10-CM | POA: Diagnosis not present

## 2017-01-01 DIAGNOSIS — I10 Essential (primary) hypertension: Secondary | ICD-10-CM | POA: Diagnosis not present

## 2017-01-01 DIAGNOSIS — E039 Hypothyroidism, unspecified: Secondary | ICD-10-CM | POA: Diagnosis not present

## 2017-01-01 DIAGNOSIS — E785 Hyperlipidemia, unspecified: Secondary | ICD-10-CM | POA: Diagnosis not present

## 2017-01-01 DIAGNOSIS — E46 Unspecified protein-calorie malnutrition: Secondary | ICD-10-CM | POA: Diagnosis not present

## 2017-01-01 DIAGNOSIS — F329 Major depressive disorder, single episode, unspecified: Secondary | ICD-10-CM | POA: Diagnosis not present

## 2017-01-01 DIAGNOSIS — M1990 Unspecified osteoarthritis, unspecified site: Secondary | ICD-10-CM | POA: Diagnosis not present

## 2017-01-03 DIAGNOSIS — I10 Essential (primary) hypertension: Secondary | ICD-10-CM | POA: Diagnosis not present

## 2017-01-03 DIAGNOSIS — E039 Hypothyroidism, unspecified: Secondary | ICD-10-CM | POA: Diagnosis not present

## 2017-01-03 DIAGNOSIS — F329 Major depressive disorder, single episode, unspecified: Secondary | ICD-10-CM | POA: Diagnosis not present

## 2017-01-03 DIAGNOSIS — M1990 Unspecified osteoarthritis, unspecified site: Secondary | ICD-10-CM | POA: Diagnosis not present

## 2017-01-03 DIAGNOSIS — E46 Unspecified protein-calorie malnutrition: Secondary | ICD-10-CM | POA: Diagnosis not present

## 2017-01-03 DIAGNOSIS — E785 Hyperlipidemia, unspecified: Secondary | ICD-10-CM | POA: Diagnosis not present

## 2017-01-04 DIAGNOSIS — E785 Hyperlipidemia, unspecified: Secondary | ICD-10-CM | POA: Diagnosis not present

## 2017-01-04 DIAGNOSIS — I10 Essential (primary) hypertension: Secondary | ICD-10-CM | POA: Diagnosis not present

## 2017-01-04 DIAGNOSIS — M1990 Unspecified osteoarthritis, unspecified site: Secondary | ICD-10-CM | POA: Diagnosis not present

## 2017-01-04 DIAGNOSIS — E039 Hypothyroidism, unspecified: Secondary | ICD-10-CM | POA: Diagnosis not present

## 2017-01-04 DIAGNOSIS — F329 Major depressive disorder, single episode, unspecified: Secondary | ICD-10-CM | POA: Diagnosis not present

## 2017-01-04 DIAGNOSIS — E46 Unspecified protein-calorie malnutrition: Secondary | ICD-10-CM | POA: Diagnosis not present

## 2017-01-07 DIAGNOSIS — E785 Hyperlipidemia, unspecified: Secondary | ICD-10-CM | POA: Diagnosis not present

## 2017-01-07 DIAGNOSIS — M1990 Unspecified osteoarthritis, unspecified site: Secondary | ICD-10-CM | POA: Diagnosis not present

## 2017-01-07 DIAGNOSIS — F329 Major depressive disorder, single episode, unspecified: Secondary | ICD-10-CM | POA: Diagnosis not present

## 2017-01-07 DIAGNOSIS — I10 Essential (primary) hypertension: Secondary | ICD-10-CM | POA: Diagnosis not present

## 2017-01-07 DIAGNOSIS — E46 Unspecified protein-calorie malnutrition: Secondary | ICD-10-CM | POA: Diagnosis not present

## 2017-01-07 DIAGNOSIS — E039 Hypothyroidism, unspecified: Secondary | ICD-10-CM | POA: Diagnosis not present

## 2017-01-10 DIAGNOSIS — F329 Major depressive disorder, single episode, unspecified: Secondary | ICD-10-CM | POA: Diagnosis not present

## 2017-01-10 DIAGNOSIS — I10 Essential (primary) hypertension: Secondary | ICD-10-CM | POA: Diagnosis not present

## 2017-01-10 DIAGNOSIS — M1990 Unspecified osteoarthritis, unspecified site: Secondary | ICD-10-CM | POA: Diagnosis not present

## 2017-01-10 DIAGNOSIS — E785 Hyperlipidemia, unspecified: Secondary | ICD-10-CM | POA: Diagnosis not present

## 2017-01-10 DIAGNOSIS — E039 Hypothyroidism, unspecified: Secondary | ICD-10-CM | POA: Diagnosis not present

## 2017-01-10 DIAGNOSIS — E46 Unspecified protein-calorie malnutrition: Secondary | ICD-10-CM | POA: Diagnosis not present

## 2017-01-11 DIAGNOSIS — E039 Hypothyroidism, unspecified: Secondary | ICD-10-CM | POA: Diagnosis not present

## 2017-01-11 DIAGNOSIS — E46 Unspecified protein-calorie malnutrition: Secondary | ICD-10-CM | POA: Diagnosis not present

## 2017-01-11 DIAGNOSIS — I10 Essential (primary) hypertension: Secondary | ICD-10-CM | POA: Diagnosis not present

## 2017-01-11 DIAGNOSIS — F329 Major depressive disorder, single episode, unspecified: Secondary | ICD-10-CM | POA: Diagnosis not present

## 2017-01-11 DIAGNOSIS — M1990 Unspecified osteoarthritis, unspecified site: Secondary | ICD-10-CM | POA: Diagnosis not present

## 2017-01-11 DIAGNOSIS — E785 Hyperlipidemia, unspecified: Secondary | ICD-10-CM | POA: Diagnosis not present

## 2017-01-14 DIAGNOSIS — E785 Hyperlipidemia, unspecified: Secondary | ICD-10-CM | POA: Diagnosis not present

## 2017-01-14 DIAGNOSIS — E039 Hypothyroidism, unspecified: Secondary | ICD-10-CM | POA: Diagnosis not present

## 2017-01-14 DIAGNOSIS — M1990 Unspecified osteoarthritis, unspecified site: Secondary | ICD-10-CM | POA: Diagnosis not present

## 2017-01-14 DIAGNOSIS — I10 Essential (primary) hypertension: Secondary | ICD-10-CM | POA: Diagnosis not present

## 2017-01-14 DIAGNOSIS — E46 Unspecified protein-calorie malnutrition: Secondary | ICD-10-CM | POA: Diagnosis not present

## 2017-01-14 DIAGNOSIS — F329 Major depressive disorder, single episode, unspecified: Secondary | ICD-10-CM | POA: Diagnosis not present

## 2017-01-15 DIAGNOSIS — E46 Unspecified protein-calorie malnutrition: Secondary | ICD-10-CM | POA: Diagnosis not present

## 2017-01-15 DIAGNOSIS — M1990 Unspecified osteoarthritis, unspecified site: Secondary | ICD-10-CM | POA: Diagnosis not present

## 2017-01-15 DIAGNOSIS — E039 Hypothyroidism, unspecified: Secondary | ICD-10-CM | POA: Diagnosis not present

## 2017-01-15 DIAGNOSIS — F329 Major depressive disorder, single episode, unspecified: Secondary | ICD-10-CM | POA: Diagnosis not present

## 2017-01-15 DIAGNOSIS — E785 Hyperlipidemia, unspecified: Secondary | ICD-10-CM | POA: Diagnosis not present

## 2017-01-15 DIAGNOSIS — I10 Essential (primary) hypertension: Secondary | ICD-10-CM | POA: Diagnosis not present

## 2017-01-17 DIAGNOSIS — E46 Unspecified protein-calorie malnutrition: Secondary | ICD-10-CM | POA: Diagnosis not present

## 2017-01-17 DIAGNOSIS — I10 Essential (primary) hypertension: Secondary | ICD-10-CM | POA: Diagnosis not present

## 2017-01-17 DIAGNOSIS — E785 Hyperlipidemia, unspecified: Secondary | ICD-10-CM | POA: Diagnosis not present

## 2017-01-17 DIAGNOSIS — E039 Hypothyroidism, unspecified: Secondary | ICD-10-CM | POA: Diagnosis not present

## 2017-01-17 DIAGNOSIS — F329 Major depressive disorder, single episode, unspecified: Secondary | ICD-10-CM | POA: Diagnosis not present

## 2017-01-17 DIAGNOSIS — M1990 Unspecified osteoarthritis, unspecified site: Secondary | ICD-10-CM | POA: Diagnosis not present

## 2017-01-18 DIAGNOSIS — E46 Unspecified protein-calorie malnutrition: Secondary | ICD-10-CM | POA: Diagnosis not present

## 2017-01-18 DIAGNOSIS — E785 Hyperlipidemia, unspecified: Secondary | ICD-10-CM | POA: Diagnosis not present

## 2017-01-18 DIAGNOSIS — E039 Hypothyroidism, unspecified: Secondary | ICD-10-CM | POA: Diagnosis not present

## 2017-01-18 DIAGNOSIS — M1990 Unspecified osteoarthritis, unspecified site: Secondary | ICD-10-CM | POA: Diagnosis not present

## 2017-01-18 DIAGNOSIS — F329 Major depressive disorder, single episode, unspecified: Secondary | ICD-10-CM | POA: Diagnosis not present

## 2017-01-18 DIAGNOSIS — I10 Essential (primary) hypertension: Secondary | ICD-10-CM | POA: Diagnosis not present

## 2017-01-21 DIAGNOSIS — E039 Hypothyroidism, unspecified: Secondary | ICD-10-CM | POA: Diagnosis not present

## 2017-01-21 DIAGNOSIS — E46 Unspecified protein-calorie malnutrition: Secondary | ICD-10-CM | POA: Diagnosis not present

## 2017-01-21 DIAGNOSIS — I10 Essential (primary) hypertension: Secondary | ICD-10-CM | POA: Diagnosis not present

## 2017-01-21 DIAGNOSIS — M1990 Unspecified osteoarthritis, unspecified site: Secondary | ICD-10-CM | POA: Diagnosis not present

## 2017-01-21 DIAGNOSIS — F329 Major depressive disorder, single episode, unspecified: Secondary | ICD-10-CM | POA: Diagnosis not present

## 2017-01-21 DIAGNOSIS — E785 Hyperlipidemia, unspecified: Secondary | ICD-10-CM | POA: Diagnosis not present

## 2017-01-22 DIAGNOSIS — E785 Hyperlipidemia, unspecified: Secondary | ICD-10-CM | POA: Diagnosis not present

## 2017-01-22 DIAGNOSIS — E46 Unspecified protein-calorie malnutrition: Secondary | ICD-10-CM | POA: Diagnosis not present

## 2017-01-22 DIAGNOSIS — I10 Essential (primary) hypertension: Secondary | ICD-10-CM | POA: Diagnosis not present

## 2017-01-22 DIAGNOSIS — M1990 Unspecified osteoarthritis, unspecified site: Secondary | ICD-10-CM | POA: Diagnosis not present

## 2017-01-22 DIAGNOSIS — E039 Hypothyroidism, unspecified: Secondary | ICD-10-CM | POA: Diagnosis not present

## 2017-01-22 DIAGNOSIS — F329 Major depressive disorder, single episode, unspecified: Secondary | ICD-10-CM | POA: Diagnosis not present

## 2017-01-23 DIAGNOSIS — G250 Essential tremor: Secondary | ICD-10-CM | POA: Diagnosis not present

## 2017-01-23 DIAGNOSIS — Z6823 Body mass index (BMI) 23.0-23.9, adult: Secondary | ICD-10-CM | POA: Diagnosis not present

## 2017-01-23 DIAGNOSIS — M1990 Unspecified osteoarthritis, unspecified site: Secondary | ICD-10-CM | POA: Diagnosis not present

## 2017-01-23 DIAGNOSIS — Z1389 Encounter for screening for other disorder: Secondary | ICD-10-CM | POA: Diagnosis not present

## 2017-01-23 DIAGNOSIS — I638 Other cerebral infarction: Secondary | ICD-10-CM | POA: Diagnosis not present

## 2017-01-23 DIAGNOSIS — R627 Adult failure to thrive: Secondary | ICD-10-CM | POA: Diagnosis not present

## 2017-01-23 DIAGNOSIS — E784 Other hyperlipidemia: Secondary | ICD-10-CM | POA: Diagnosis not present

## 2017-01-23 DIAGNOSIS — G308 Other Alzheimer's disease: Secondary | ICD-10-CM | POA: Diagnosis not present

## 2017-01-23 DIAGNOSIS — E038 Other specified hypothyroidism: Secondary | ICD-10-CM | POA: Diagnosis not present

## 2017-01-23 DIAGNOSIS — R531 Weakness: Secondary | ICD-10-CM | POA: Diagnosis not present

## 2017-01-23 DIAGNOSIS — F329 Major depressive disorder, single episode, unspecified: Secondary | ICD-10-CM | POA: Diagnosis not present

## 2017-01-23 DIAGNOSIS — I10 Essential (primary) hypertension: Secondary | ICD-10-CM | POA: Diagnosis not present

## 2017-01-24 DIAGNOSIS — E785 Hyperlipidemia, unspecified: Secondary | ICD-10-CM | POA: Diagnosis not present

## 2017-01-24 DIAGNOSIS — E039 Hypothyroidism, unspecified: Secondary | ICD-10-CM | POA: Diagnosis not present

## 2017-01-24 DIAGNOSIS — F329 Major depressive disorder, single episode, unspecified: Secondary | ICD-10-CM | POA: Diagnosis not present

## 2017-01-24 DIAGNOSIS — M1990 Unspecified osteoarthritis, unspecified site: Secondary | ICD-10-CM | POA: Diagnosis not present

## 2017-01-24 DIAGNOSIS — E46 Unspecified protein-calorie malnutrition: Secondary | ICD-10-CM | POA: Diagnosis not present

## 2017-01-24 DIAGNOSIS — I10 Essential (primary) hypertension: Secondary | ICD-10-CM | POA: Diagnosis not present

## 2017-01-25 DIAGNOSIS — E039 Hypothyroidism, unspecified: Secondary | ICD-10-CM | POA: Diagnosis not present

## 2017-01-25 DIAGNOSIS — E785 Hyperlipidemia, unspecified: Secondary | ICD-10-CM | POA: Diagnosis not present

## 2017-01-25 DIAGNOSIS — M1990 Unspecified osteoarthritis, unspecified site: Secondary | ICD-10-CM | POA: Diagnosis not present

## 2017-01-25 DIAGNOSIS — I10 Essential (primary) hypertension: Secondary | ICD-10-CM | POA: Diagnosis not present

## 2017-01-25 DIAGNOSIS — E46 Unspecified protein-calorie malnutrition: Secondary | ICD-10-CM | POA: Diagnosis not present

## 2017-01-25 DIAGNOSIS — F329 Major depressive disorder, single episode, unspecified: Secondary | ICD-10-CM | POA: Diagnosis not present

## 2017-01-27 DIAGNOSIS — G250 Essential tremor: Secondary | ICD-10-CM | POA: Diagnosis not present

## 2017-01-27 DIAGNOSIS — E039 Hypothyroidism, unspecified: Secondary | ICD-10-CM | POA: Diagnosis not present

## 2017-01-27 DIAGNOSIS — E46 Unspecified protein-calorie malnutrition: Secondary | ICD-10-CM | POA: Diagnosis not present

## 2017-01-27 DIAGNOSIS — I639 Cerebral infarction, unspecified: Secondary | ICD-10-CM | POA: Diagnosis not present

## 2017-01-27 DIAGNOSIS — F329 Major depressive disorder, single episode, unspecified: Secondary | ICD-10-CM | POA: Diagnosis not present

## 2017-01-27 DIAGNOSIS — E785 Hyperlipidemia, unspecified: Secondary | ICD-10-CM | POA: Diagnosis not present

## 2017-01-27 DIAGNOSIS — M1990 Unspecified osteoarthritis, unspecified site: Secondary | ICD-10-CM | POA: Diagnosis not present

## 2017-01-27 DIAGNOSIS — G309 Alzheimer's disease, unspecified: Secondary | ICD-10-CM | POA: Diagnosis not present

## 2017-01-27 DIAGNOSIS — R531 Weakness: Secondary | ICD-10-CM | POA: Diagnosis not present

## 2017-01-27 DIAGNOSIS — I10 Essential (primary) hypertension: Secondary | ICD-10-CM | POA: Diagnosis not present

## 2017-01-29 DIAGNOSIS — F329 Major depressive disorder, single episode, unspecified: Secondary | ICD-10-CM | POA: Diagnosis not present

## 2017-01-29 DIAGNOSIS — M1990 Unspecified osteoarthritis, unspecified site: Secondary | ICD-10-CM | POA: Diagnosis not present

## 2017-01-29 DIAGNOSIS — I10 Essential (primary) hypertension: Secondary | ICD-10-CM | POA: Diagnosis not present

## 2017-01-29 DIAGNOSIS — E785 Hyperlipidemia, unspecified: Secondary | ICD-10-CM | POA: Diagnosis not present

## 2017-01-29 DIAGNOSIS — E46 Unspecified protein-calorie malnutrition: Secondary | ICD-10-CM | POA: Diagnosis not present

## 2017-01-29 DIAGNOSIS — E039 Hypothyroidism, unspecified: Secondary | ICD-10-CM | POA: Diagnosis not present

## 2017-01-31 DIAGNOSIS — E785 Hyperlipidemia, unspecified: Secondary | ICD-10-CM | POA: Diagnosis not present

## 2017-01-31 DIAGNOSIS — F329 Major depressive disorder, single episode, unspecified: Secondary | ICD-10-CM | POA: Diagnosis not present

## 2017-01-31 DIAGNOSIS — E039 Hypothyroidism, unspecified: Secondary | ICD-10-CM | POA: Diagnosis not present

## 2017-01-31 DIAGNOSIS — M1990 Unspecified osteoarthritis, unspecified site: Secondary | ICD-10-CM | POA: Diagnosis not present

## 2017-01-31 DIAGNOSIS — I10 Essential (primary) hypertension: Secondary | ICD-10-CM | POA: Diagnosis not present

## 2017-01-31 DIAGNOSIS — E46 Unspecified protein-calorie malnutrition: Secondary | ICD-10-CM | POA: Diagnosis not present

## 2017-02-01 DIAGNOSIS — E785 Hyperlipidemia, unspecified: Secondary | ICD-10-CM | POA: Diagnosis not present

## 2017-02-01 DIAGNOSIS — F329 Major depressive disorder, single episode, unspecified: Secondary | ICD-10-CM | POA: Diagnosis not present

## 2017-02-01 DIAGNOSIS — E46 Unspecified protein-calorie malnutrition: Secondary | ICD-10-CM | POA: Diagnosis not present

## 2017-02-01 DIAGNOSIS — E039 Hypothyroidism, unspecified: Secondary | ICD-10-CM | POA: Diagnosis not present

## 2017-02-01 DIAGNOSIS — M1990 Unspecified osteoarthritis, unspecified site: Secondary | ICD-10-CM | POA: Diagnosis not present

## 2017-02-01 DIAGNOSIS — I10 Essential (primary) hypertension: Secondary | ICD-10-CM | POA: Diagnosis not present

## 2017-02-04 DIAGNOSIS — E46 Unspecified protein-calorie malnutrition: Secondary | ICD-10-CM | POA: Diagnosis not present

## 2017-02-04 DIAGNOSIS — E039 Hypothyroidism, unspecified: Secondary | ICD-10-CM | POA: Diagnosis not present

## 2017-02-04 DIAGNOSIS — F329 Major depressive disorder, single episode, unspecified: Secondary | ICD-10-CM | POA: Diagnosis not present

## 2017-02-04 DIAGNOSIS — I10 Essential (primary) hypertension: Secondary | ICD-10-CM | POA: Diagnosis not present

## 2017-02-04 DIAGNOSIS — M1990 Unspecified osteoarthritis, unspecified site: Secondary | ICD-10-CM | POA: Diagnosis not present

## 2017-02-04 DIAGNOSIS — E785 Hyperlipidemia, unspecified: Secondary | ICD-10-CM | POA: Diagnosis not present

## 2017-02-05 DIAGNOSIS — E46 Unspecified protein-calorie malnutrition: Secondary | ICD-10-CM | POA: Diagnosis not present

## 2017-02-05 DIAGNOSIS — M1990 Unspecified osteoarthritis, unspecified site: Secondary | ICD-10-CM | POA: Diagnosis not present

## 2017-02-05 DIAGNOSIS — E039 Hypothyroidism, unspecified: Secondary | ICD-10-CM | POA: Diagnosis not present

## 2017-02-05 DIAGNOSIS — I10 Essential (primary) hypertension: Secondary | ICD-10-CM | POA: Diagnosis not present

## 2017-02-05 DIAGNOSIS — E785 Hyperlipidemia, unspecified: Secondary | ICD-10-CM | POA: Diagnosis not present

## 2017-02-05 DIAGNOSIS — F329 Major depressive disorder, single episode, unspecified: Secondary | ICD-10-CM | POA: Diagnosis not present

## 2017-02-07 DIAGNOSIS — E785 Hyperlipidemia, unspecified: Secondary | ICD-10-CM | POA: Diagnosis not present

## 2017-02-07 DIAGNOSIS — I10 Essential (primary) hypertension: Secondary | ICD-10-CM | POA: Diagnosis not present

## 2017-02-07 DIAGNOSIS — E039 Hypothyroidism, unspecified: Secondary | ICD-10-CM | POA: Diagnosis not present

## 2017-02-07 DIAGNOSIS — F329 Major depressive disorder, single episode, unspecified: Secondary | ICD-10-CM | POA: Diagnosis not present

## 2017-02-07 DIAGNOSIS — M1990 Unspecified osteoarthritis, unspecified site: Secondary | ICD-10-CM | POA: Diagnosis not present

## 2017-02-07 DIAGNOSIS — E46 Unspecified protein-calorie malnutrition: Secondary | ICD-10-CM | POA: Diagnosis not present

## 2017-02-08 DIAGNOSIS — E039 Hypothyroidism, unspecified: Secondary | ICD-10-CM | POA: Diagnosis not present

## 2017-02-08 DIAGNOSIS — I10 Essential (primary) hypertension: Secondary | ICD-10-CM | POA: Diagnosis not present

## 2017-02-08 DIAGNOSIS — E46 Unspecified protein-calorie malnutrition: Secondary | ICD-10-CM | POA: Diagnosis not present

## 2017-02-08 DIAGNOSIS — M1990 Unspecified osteoarthritis, unspecified site: Secondary | ICD-10-CM | POA: Diagnosis not present

## 2017-02-08 DIAGNOSIS — F329 Major depressive disorder, single episode, unspecified: Secondary | ICD-10-CM | POA: Diagnosis not present

## 2017-02-08 DIAGNOSIS — E785 Hyperlipidemia, unspecified: Secondary | ICD-10-CM | POA: Diagnosis not present

## 2017-02-12 DIAGNOSIS — E785 Hyperlipidemia, unspecified: Secondary | ICD-10-CM | POA: Diagnosis not present

## 2017-02-12 DIAGNOSIS — M1990 Unspecified osteoarthritis, unspecified site: Secondary | ICD-10-CM | POA: Diagnosis not present

## 2017-02-12 DIAGNOSIS — I10 Essential (primary) hypertension: Secondary | ICD-10-CM | POA: Diagnosis not present

## 2017-02-12 DIAGNOSIS — E46 Unspecified protein-calorie malnutrition: Secondary | ICD-10-CM | POA: Diagnosis not present

## 2017-02-12 DIAGNOSIS — F329 Major depressive disorder, single episode, unspecified: Secondary | ICD-10-CM | POA: Diagnosis not present

## 2017-02-12 DIAGNOSIS — E039 Hypothyroidism, unspecified: Secondary | ICD-10-CM | POA: Diagnosis not present

## 2017-02-13 DIAGNOSIS — I10 Essential (primary) hypertension: Secondary | ICD-10-CM | POA: Diagnosis not present

## 2017-02-13 DIAGNOSIS — E46 Unspecified protein-calorie malnutrition: Secondary | ICD-10-CM | POA: Diagnosis not present

## 2017-02-13 DIAGNOSIS — F329 Major depressive disorder, single episode, unspecified: Secondary | ICD-10-CM | POA: Diagnosis not present

## 2017-02-13 DIAGNOSIS — E039 Hypothyroidism, unspecified: Secondary | ICD-10-CM | POA: Diagnosis not present

## 2017-02-13 DIAGNOSIS — E785 Hyperlipidemia, unspecified: Secondary | ICD-10-CM | POA: Diagnosis not present

## 2017-02-13 DIAGNOSIS — M1990 Unspecified osteoarthritis, unspecified site: Secondary | ICD-10-CM | POA: Diagnosis not present

## 2017-02-14 DIAGNOSIS — F329 Major depressive disorder, single episode, unspecified: Secondary | ICD-10-CM | POA: Diagnosis not present

## 2017-02-14 DIAGNOSIS — E46 Unspecified protein-calorie malnutrition: Secondary | ICD-10-CM | POA: Diagnosis not present

## 2017-02-14 DIAGNOSIS — M1990 Unspecified osteoarthritis, unspecified site: Secondary | ICD-10-CM | POA: Diagnosis not present

## 2017-02-14 DIAGNOSIS — I10 Essential (primary) hypertension: Secondary | ICD-10-CM | POA: Diagnosis not present

## 2017-02-14 DIAGNOSIS — E039 Hypothyroidism, unspecified: Secondary | ICD-10-CM | POA: Diagnosis not present

## 2017-02-14 DIAGNOSIS — E785 Hyperlipidemia, unspecified: Secondary | ICD-10-CM | POA: Diagnosis not present

## 2017-02-18 DIAGNOSIS — I10 Essential (primary) hypertension: Secondary | ICD-10-CM | POA: Diagnosis not present

## 2017-02-18 DIAGNOSIS — E46 Unspecified protein-calorie malnutrition: Secondary | ICD-10-CM | POA: Diagnosis not present

## 2017-02-18 DIAGNOSIS — E785 Hyperlipidemia, unspecified: Secondary | ICD-10-CM | POA: Diagnosis not present

## 2017-02-18 DIAGNOSIS — E039 Hypothyroidism, unspecified: Secondary | ICD-10-CM | POA: Diagnosis not present

## 2017-02-18 DIAGNOSIS — F329 Major depressive disorder, single episode, unspecified: Secondary | ICD-10-CM | POA: Diagnosis not present

## 2017-02-18 DIAGNOSIS — M1990 Unspecified osteoarthritis, unspecified site: Secondary | ICD-10-CM | POA: Diagnosis not present

## 2017-02-19 DIAGNOSIS — F329 Major depressive disorder, single episode, unspecified: Secondary | ICD-10-CM | POA: Diagnosis not present

## 2017-02-19 DIAGNOSIS — M1990 Unspecified osteoarthritis, unspecified site: Secondary | ICD-10-CM | POA: Diagnosis not present

## 2017-02-19 DIAGNOSIS — I10 Essential (primary) hypertension: Secondary | ICD-10-CM | POA: Diagnosis not present

## 2017-02-19 DIAGNOSIS — E785 Hyperlipidemia, unspecified: Secondary | ICD-10-CM | POA: Diagnosis not present

## 2017-02-19 DIAGNOSIS — E039 Hypothyroidism, unspecified: Secondary | ICD-10-CM | POA: Diagnosis not present

## 2017-02-19 DIAGNOSIS — E46 Unspecified protein-calorie malnutrition: Secondary | ICD-10-CM | POA: Diagnosis not present

## 2017-02-21 DIAGNOSIS — E039 Hypothyroidism, unspecified: Secondary | ICD-10-CM | POA: Diagnosis not present

## 2017-02-21 DIAGNOSIS — M1990 Unspecified osteoarthritis, unspecified site: Secondary | ICD-10-CM | POA: Diagnosis not present

## 2017-02-21 DIAGNOSIS — E46 Unspecified protein-calorie malnutrition: Secondary | ICD-10-CM | POA: Diagnosis not present

## 2017-02-21 DIAGNOSIS — E785 Hyperlipidemia, unspecified: Secondary | ICD-10-CM | POA: Diagnosis not present

## 2017-02-21 DIAGNOSIS — F329 Major depressive disorder, single episode, unspecified: Secondary | ICD-10-CM | POA: Diagnosis not present

## 2017-02-21 DIAGNOSIS — I10 Essential (primary) hypertension: Secondary | ICD-10-CM | POA: Diagnosis not present

## 2017-02-22 DIAGNOSIS — I10 Essential (primary) hypertension: Secondary | ICD-10-CM | POA: Diagnosis not present

## 2017-02-22 DIAGNOSIS — F329 Major depressive disorder, single episode, unspecified: Secondary | ICD-10-CM | POA: Diagnosis not present

## 2017-02-22 DIAGNOSIS — E46 Unspecified protein-calorie malnutrition: Secondary | ICD-10-CM | POA: Diagnosis not present

## 2017-02-22 DIAGNOSIS — M1990 Unspecified osteoarthritis, unspecified site: Secondary | ICD-10-CM | POA: Diagnosis not present

## 2017-02-22 DIAGNOSIS — E039 Hypothyroidism, unspecified: Secondary | ICD-10-CM | POA: Diagnosis not present

## 2017-02-22 DIAGNOSIS — E785 Hyperlipidemia, unspecified: Secondary | ICD-10-CM | POA: Diagnosis not present

## 2017-02-25 DIAGNOSIS — E785 Hyperlipidemia, unspecified: Secondary | ICD-10-CM | POA: Diagnosis not present

## 2017-02-25 DIAGNOSIS — F329 Major depressive disorder, single episode, unspecified: Secondary | ICD-10-CM | POA: Diagnosis not present

## 2017-02-25 DIAGNOSIS — I10 Essential (primary) hypertension: Secondary | ICD-10-CM | POA: Diagnosis not present

## 2017-02-25 DIAGNOSIS — E039 Hypothyroidism, unspecified: Secondary | ICD-10-CM | POA: Diagnosis not present

## 2017-02-25 DIAGNOSIS — E46 Unspecified protein-calorie malnutrition: Secondary | ICD-10-CM | POA: Diagnosis not present

## 2017-02-25 DIAGNOSIS — M1990 Unspecified osteoarthritis, unspecified site: Secondary | ICD-10-CM | POA: Diagnosis not present

## 2017-02-26 DIAGNOSIS — E785 Hyperlipidemia, unspecified: Secondary | ICD-10-CM | POA: Diagnosis not present

## 2017-02-26 DIAGNOSIS — I639 Cerebral infarction, unspecified: Secondary | ICD-10-CM | POA: Diagnosis not present

## 2017-02-26 DIAGNOSIS — M1990 Unspecified osteoarthritis, unspecified site: Secondary | ICD-10-CM | POA: Diagnosis not present

## 2017-02-26 DIAGNOSIS — E039 Hypothyroidism, unspecified: Secondary | ICD-10-CM | POA: Diagnosis not present

## 2017-02-26 DIAGNOSIS — G309 Alzheimer's disease, unspecified: Secondary | ICD-10-CM | POA: Diagnosis not present

## 2017-02-26 DIAGNOSIS — F329 Major depressive disorder, single episode, unspecified: Secondary | ICD-10-CM | POA: Diagnosis not present

## 2017-02-26 DIAGNOSIS — I10 Essential (primary) hypertension: Secondary | ICD-10-CM | POA: Diagnosis not present

## 2017-02-26 DIAGNOSIS — G250 Essential tremor: Secondary | ICD-10-CM | POA: Diagnosis not present

## 2017-02-26 DIAGNOSIS — E46 Unspecified protein-calorie malnutrition: Secondary | ICD-10-CM | POA: Diagnosis not present

## 2017-02-26 DIAGNOSIS — R531 Weakness: Secondary | ICD-10-CM | POA: Diagnosis not present

## 2017-02-28 DIAGNOSIS — E785 Hyperlipidemia, unspecified: Secondary | ICD-10-CM | POA: Diagnosis not present

## 2017-02-28 DIAGNOSIS — E46 Unspecified protein-calorie malnutrition: Secondary | ICD-10-CM | POA: Diagnosis not present

## 2017-02-28 DIAGNOSIS — F329 Major depressive disorder, single episode, unspecified: Secondary | ICD-10-CM | POA: Diagnosis not present

## 2017-02-28 DIAGNOSIS — E039 Hypothyroidism, unspecified: Secondary | ICD-10-CM | POA: Diagnosis not present

## 2017-02-28 DIAGNOSIS — I10 Essential (primary) hypertension: Secondary | ICD-10-CM | POA: Diagnosis not present

## 2017-02-28 DIAGNOSIS — M1990 Unspecified osteoarthritis, unspecified site: Secondary | ICD-10-CM | POA: Diagnosis not present

## 2017-03-01 DIAGNOSIS — E46 Unspecified protein-calorie malnutrition: Secondary | ICD-10-CM | POA: Diagnosis not present

## 2017-03-01 DIAGNOSIS — I10 Essential (primary) hypertension: Secondary | ICD-10-CM | POA: Diagnosis not present

## 2017-03-01 DIAGNOSIS — E039 Hypothyroidism, unspecified: Secondary | ICD-10-CM | POA: Diagnosis not present

## 2017-03-01 DIAGNOSIS — F329 Major depressive disorder, single episode, unspecified: Secondary | ICD-10-CM | POA: Diagnosis not present

## 2017-03-01 DIAGNOSIS — E785 Hyperlipidemia, unspecified: Secondary | ICD-10-CM | POA: Diagnosis not present

## 2017-03-01 DIAGNOSIS — M1990 Unspecified osteoarthritis, unspecified site: Secondary | ICD-10-CM | POA: Diagnosis not present

## 2017-03-04 DIAGNOSIS — E039 Hypothyroidism, unspecified: Secondary | ICD-10-CM | POA: Diagnosis not present

## 2017-03-04 DIAGNOSIS — E46 Unspecified protein-calorie malnutrition: Secondary | ICD-10-CM | POA: Diagnosis not present

## 2017-03-04 DIAGNOSIS — M1990 Unspecified osteoarthritis, unspecified site: Secondary | ICD-10-CM | POA: Diagnosis not present

## 2017-03-04 DIAGNOSIS — F329 Major depressive disorder, single episode, unspecified: Secondary | ICD-10-CM | POA: Diagnosis not present

## 2017-03-04 DIAGNOSIS — E785 Hyperlipidemia, unspecified: Secondary | ICD-10-CM | POA: Diagnosis not present

## 2017-03-04 DIAGNOSIS — I10 Essential (primary) hypertension: Secondary | ICD-10-CM | POA: Diagnosis not present

## 2017-03-05 DIAGNOSIS — E785 Hyperlipidemia, unspecified: Secondary | ICD-10-CM | POA: Diagnosis not present

## 2017-03-05 DIAGNOSIS — E039 Hypothyroidism, unspecified: Secondary | ICD-10-CM | POA: Diagnosis not present

## 2017-03-05 DIAGNOSIS — E46 Unspecified protein-calorie malnutrition: Secondary | ICD-10-CM | POA: Diagnosis not present

## 2017-03-05 DIAGNOSIS — M1990 Unspecified osteoarthritis, unspecified site: Secondary | ICD-10-CM | POA: Diagnosis not present

## 2017-03-05 DIAGNOSIS — F329 Major depressive disorder, single episode, unspecified: Secondary | ICD-10-CM | POA: Diagnosis not present

## 2017-03-05 DIAGNOSIS — I10 Essential (primary) hypertension: Secondary | ICD-10-CM | POA: Diagnosis not present

## 2017-03-06 DIAGNOSIS — M1990 Unspecified osteoarthritis, unspecified site: Secondary | ICD-10-CM | POA: Diagnosis not present

## 2017-03-06 DIAGNOSIS — E46 Unspecified protein-calorie malnutrition: Secondary | ICD-10-CM | POA: Diagnosis not present

## 2017-03-06 DIAGNOSIS — E785 Hyperlipidemia, unspecified: Secondary | ICD-10-CM | POA: Diagnosis not present

## 2017-03-06 DIAGNOSIS — E039 Hypothyroidism, unspecified: Secondary | ICD-10-CM | POA: Diagnosis not present

## 2017-03-06 DIAGNOSIS — I10 Essential (primary) hypertension: Secondary | ICD-10-CM | POA: Diagnosis not present

## 2017-03-06 DIAGNOSIS — F329 Major depressive disorder, single episode, unspecified: Secondary | ICD-10-CM | POA: Diagnosis not present

## 2017-03-07 DIAGNOSIS — E46 Unspecified protein-calorie malnutrition: Secondary | ICD-10-CM | POA: Diagnosis not present

## 2017-03-07 DIAGNOSIS — I10 Essential (primary) hypertension: Secondary | ICD-10-CM | POA: Diagnosis not present

## 2017-03-07 DIAGNOSIS — E039 Hypothyroidism, unspecified: Secondary | ICD-10-CM | POA: Diagnosis not present

## 2017-03-07 DIAGNOSIS — F329 Major depressive disorder, single episode, unspecified: Secondary | ICD-10-CM | POA: Diagnosis not present

## 2017-03-07 DIAGNOSIS — E785 Hyperlipidemia, unspecified: Secondary | ICD-10-CM | POA: Diagnosis not present

## 2017-03-07 DIAGNOSIS — M1990 Unspecified osteoarthritis, unspecified site: Secondary | ICD-10-CM | POA: Diagnosis not present

## 2017-03-08 DIAGNOSIS — I10 Essential (primary) hypertension: Secondary | ICD-10-CM | POA: Diagnosis not present

## 2017-03-08 DIAGNOSIS — E46 Unspecified protein-calorie malnutrition: Secondary | ICD-10-CM | POA: Diagnosis not present

## 2017-03-08 DIAGNOSIS — E785 Hyperlipidemia, unspecified: Secondary | ICD-10-CM | POA: Diagnosis not present

## 2017-03-08 DIAGNOSIS — E039 Hypothyroidism, unspecified: Secondary | ICD-10-CM | POA: Diagnosis not present

## 2017-03-08 DIAGNOSIS — F329 Major depressive disorder, single episode, unspecified: Secondary | ICD-10-CM | POA: Diagnosis not present

## 2017-03-08 DIAGNOSIS — M1990 Unspecified osteoarthritis, unspecified site: Secondary | ICD-10-CM | POA: Diagnosis not present

## 2017-03-11 DIAGNOSIS — E785 Hyperlipidemia, unspecified: Secondary | ICD-10-CM | POA: Diagnosis not present

## 2017-03-11 DIAGNOSIS — I10 Essential (primary) hypertension: Secondary | ICD-10-CM | POA: Diagnosis not present

## 2017-03-11 DIAGNOSIS — E46 Unspecified protein-calorie malnutrition: Secondary | ICD-10-CM | POA: Diagnosis not present

## 2017-03-11 DIAGNOSIS — M1990 Unspecified osteoarthritis, unspecified site: Secondary | ICD-10-CM | POA: Diagnosis not present

## 2017-03-11 DIAGNOSIS — E039 Hypothyroidism, unspecified: Secondary | ICD-10-CM | POA: Diagnosis not present

## 2017-03-11 DIAGNOSIS — F329 Major depressive disorder, single episode, unspecified: Secondary | ICD-10-CM | POA: Diagnosis not present

## 2017-03-12 DIAGNOSIS — I10 Essential (primary) hypertension: Secondary | ICD-10-CM | POA: Diagnosis not present

## 2017-03-12 DIAGNOSIS — E785 Hyperlipidemia, unspecified: Secondary | ICD-10-CM | POA: Diagnosis not present

## 2017-03-12 DIAGNOSIS — M1990 Unspecified osteoarthritis, unspecified site: Secondary | ICD-10-CM | POA: Diagnosis not present

## 2017-03-12 DIAGNOSIS — E039 Hypothyroidism, unspecified: Secondary | ICD-10-CM | POA: Diagnosis not present

## 2017-03-12 DIAGNOSIS — E46 Unspecified protein-calorie malnutrition: Secondary | ICD-10-CM | POA: Diagnosis not present

## 2017-03-12 DIAGNOSIS — F329 Major depressive disorder, single episode, unspecified: Secondary | ICD-10-CM | POA: Diagnosis not present

## 2017-03-13 DIAGNOSIS — E785 Hyperlipidemia, unspecified: Secondary | ICD-10-CM | POA: Diagnosis not present

## 2017-03-13 DIAGNOSIS — M1990 Unspecified osteoarthritis, unspecified site: Secondary | ICD-10-CM | POA: Diagnosis not present

## 2017-03-13 DIAGNOSIS — F329 Major depressive disorder, single episode, unspecified: Secondary | ICD-10-CM | POA: Diagnosis not present

## 2017-03-13 DIAGNOSIS — I10 Essential (primary) hypertension: Secondary | ICD-10-CM | POA: Diagnosis not present

## 2017-03-13 DIAGNOSIS — E46 Unspecified protein-calorie malnutrition: Secondary | ICD-10-CM | POA: Diagnosis not present

## 2017-03-13 DIAGNOSIS — E039 Hypothyroidism, unspecified: Secondary | ICD-10-CM | POA: Diagnosis not present

## 2017-03-14 DIAGNOSIS — M1990 Unspecified osteoarthritis, unspecified site: Secondary | ICD-10-CM | POA: Diagnosis not present

## 2017-03-14 DIAGNOSIS — I10 Essential (primary) hypertension: Secondary | ICD-10-CM | POA: Diagnosis not present

## 2017-03-14 DIAGNOSIS — E039 Hypothyroidism, unspecified: Secondary | ICD-10-CM | POA: Diagnosis not present

## 2017-03-14 DIAGNOSIS — F329 Major depressive disorder, single episode, unspecified: Secondary | ICD-10-CM | POA: Diagnosis not present

## 2017-03-14 DIAGNOSIS — E785 Hyperlipidemia, unspecified: Secondary | ICD-10-CM | POA: Diagnosis not present

## 2017-03-14 DIAGNOSIS — E46 Unspecified protein-calorie malnutrition: Secondary | ICD-10-CM | POA: Diagnosis not present

## 2017-03-15 DIAGNOSIS — E46 Unspecified protein-calorie malnutrition: Secondary | ICD-10-CM | POA: Diagnosis not present

## 2017-03-15 DIAGNOSIS — I10 Essential (primary) hypertension: Secondary | ICD-10-CM | POA: Diagnosis not present

## 2017-03-15 DIAGNOSIS — F329 Major depressive disorder, single episode, unspecified: Secondary | ICD-10-CM | POA: Diagnosis not present

## 2017-03-15 DIAGNOSIS — E039 Hypothyroidism, unspecified: Secondary | ICD-10-CM | POA: Diagnosis not present

## 2017-03-15 DIAGNOSIS — E785 Hyperlipidemia, unspecified: Secondary | ICD-10-CM | POA: Diagnosis not present

## 2017-03-15 DIAGNOSIS — M1990 Unspecified osteoarthritis, unspecified site: Secondary | ICD-10-CM | POA: Diagnosis not present

## 2017-03-19 DIAGNOSIS — E039 Hypothyroidism, unspecified: Secondary | ICD-10-CM | POA: Diagnosis not present

## 2017-03-19 DIAGNOSIS — F329 Major depressive disorder, single episode, unspecified: Secondary | ICD-10-CM | POA: Diagnosis not present

## 2017-03-19 DIAGNOSIS — E46 Unspecified protein-calorie malnutrition: Secondary | ICD-10-CM | POA: Diagnosis not present

## 2017-03-19 DIAGNOSIS — E785 Hyperlipidemia, unspecified: Secondary | ICD-10-CM | POA: Diagnosis not present

## 2017-03-19 DIAGNOSIS — I10 Essential (primary) hypertension: Secondary | ICD-10-CM | POA: Diagnosis not present

## 2017-03-19 DIAGNOSIS — M1990 Unspecified osteoarthritis, unspecified site: Secondary | ICD-10-CM | POA: Diagnosis not present

## 2017-03-20 DIAGNOSIS — E785 Hyperlipidemia, unspecified: Secondary | ICD-10-CM | POA: Diagnosis not present

## 2017-03-20 DIAGNOSIS — F329 Major depressive disorder, single episode, unspecified: Secondary | ICD-10-CM | POA: Diagnosis not present

## 2017-03-20 DIAGNOSIS — E46 Unspecified protein-calorie malnutrition: Secondary | ICD-10-CM | POA: Diagnosis not present

## 2017-03-20 DIAGNOSIS — I10 Essential (primary) hypertension: Secondary | ICD-10-CM | POA: Diagnosis not present

## 2017-03-20 DIAGNOSIS — M1990 Unspecified osteoarthritis, unspecified site: Secondary | ICD-10-CM | POA: Diagnosis not present

## 2017-03-20 DIAGNOSIS — E039 Hypothyroidism, unspecified: Secondary | ICD-10-CM | POA: Diagnosis not present

## 2017-03-21 DIAGNOSIS — M1990 Unspecified osteoarthritis, unspecified site: Secondary | ICD-10-CM | POA: Diagnosis not present

## 2017-03-21 DIAGNOSIS — I10 Essential (primary) hypertension: Secondary | ICD-10-CM | POA: Diagnosis not present

## 2017-03-21 DIAGNOSIS — F329 Major depressive disorder, single episode, unspecified: Secondary | ICD-10-CM | POA: Diagnosis not present

## 2017-03-21 DIAGNOSIS — E785 Hyperlipidemia, unspecified: Secondary | ICD-10-CM | POA: Diagnosis not present

## 2017-03-21 DIAGNOSIS — E46 Unspecified protein-calorie malnutrition: Secondary | ICD-10-CM | POA: Diagnosis not present

## 2017-03-21 DIAGNOSIS — E039 Hypothyroidism, unspecified: Secondary | ICD-10-CM | POA: Diagnosis not present

## 2017-03-22 DIAGNOSIS — F329 Major depressive disorder, single episode, unspecified: Secondary | ICD-10-CM | POA: Diagnosis not present

## 2017-03-22 DIAGNOSIS — E039 Hypothyroidism, unspecified: Secondary | ICD-10-CM | POA: Diagnosis not present

## 2017-03-22 DIAGNOSIS — E46 Unspecified protein-calorie malnutrition: Secondary | ICD-10-CM | POA: Diagnosis not present

## 2017-03-22 DIAGNOSIS — I10 Essential (primary) hypertension: Secondary | ICD-10-CM | POA: Diagnosis not present

## 2017-03-22 DIAGNOSIS — M1990 Unspecified osteoarthritis, unspecified site: Secondary | ICD-10-CM | POA: Diagnosis not present

## 2017-03-22 DIAGNOSIS — E785 Hyperlipidemia, unspecified: Secondary | ICD-10-CM | POA: Diagnosis not present

## 2017-03-26 DIAGNOSIS — F329 Major depressive disorder, single episode, unspecified: Secondary | ICD-10-CM | POA: Diagnosis not present

## 2017-03-26 DIAGNOSIS — E785 Hyperlipidemia, unspecified: Secondary | ICD-10-CM | POA: Diagnosis not present

## 2017-03-26 DIAGNOSIS — I10 Essential (primary) hypertension: Secondary | ICD-10-CM | POA: Diagnosis not present

## 2017-03-26 DIAGNOSIS — E46 Unspecified protein-calorie malnutrition: Secondary | ICD-10-CM | POA: Diagnosis not present

## 2017-03-26 DIAGNOSIS — E039 Hypothyroidism, unspecified: Secondary | ICD-10-CM | POA: Diagnosis not present

## 2017-03-26 DIAGNOSIS — M1990 Unspecified osteoarthritis, unspecified site: Secondary | ICD-10-CM | POA: Diagnosis not present

## 2017-03-27 DIAGNOSIS — M1990 Unspecified osteoarthritis, unspecified site: Secondary | ICD-10-CM | POA: Diagnosis not present

## 2017-03-27 DIAGNOSIS — I638 Other cerebral infarction: Secondary | ICD-10-CM | POA: Diagnosis not present

## 2017-03-27 DIAGNOSIS — E784 Other hyperlipidemia: Secondary | ICD-10-CM | POA: Diagnosis not present

## 2017-03-27 DIAGNOSIS — G250 Essential tremor: Secondary | ICD-10-CM | POA: Diagnosis not present

## 2017-03-27 DIAGNOSIS — F329 Major depressive disorder, single episode, unspecified: Secondary | ICD-10-CM | POA: Diagnosis not present

## 2017-03-27 DIAGNOSIS — G308 Other Alzheimer's disease: Secondary | ICD-10-CM | POA: Diagnosis not present

## 2017-03-27 DIAGNOSIS — E785 Hyperlipidemia, unspecified: Secondary | ICD-10-CM | POA: Diagnosis not present

## 2017-03-27 DIAGNOSIS — R531 Weakness: Secondary | ICD-10-CM | POA: Diagnosis not present

## 2017-03-27 DIAGNOSIS — I10 Essential (primary) hypertension: Secondary | ICD-10-CM | POA: Diagnosis not present

## 2017-03-27 DIAGNOSIS — E038 Other specified hypothyroidism: Secondary | ICD-10-CM | POA: Diagnosis not present

## 2017-03-27 DIAGNOSIS — E039 Hypothyroidism, unspecified: Secondary | ICD-10-CM | POA: Diagnosis not present

## 2017-03-27 DIAGNOSIS — E46 Unspecified protein-calorie malnutrition: Secondary | ICD-10-CM | POA: Diagnosis not present

## 2017-03-27 DIAGNOSIS — F3289 Other specified depressive episodes: Secondary | ICD-10-CM | POA: Diagnosis not present

## 2017-03-28 DIAGNOSIS — E46 Unspecified protein-calorie malnutrition: Secondary | ICD-10-CM | POA: Diagnosis not present

## 2017-03-28 DIAGNOSIS — E785 Hyperlipidemia, unspecified: Secondary | ICD-10-CM | POA: Diagnosis not present

## 2017-03-28 DIAGNOSIS — I10 Essential (primary) hypertension: Secondary | ICD-10-CM | POA: Diagnosis not present

## 2017-03-28 DIAGNOSIS — E039 Hypothyroidism, unspecified: Secondary | ICD-10-CM | POA: Diagnosis not present

## 2017-03-28 DIAGNOSIS — M1990 Unspecified osteoarthritis, unspecified site: Secondary | ICD-10-CM | POA: Diagnosis not present

## 2017-03-28 DIAGNOSIS — F329 Major depressive disorder, single episode, unspecified: Secondary | ICD-10-CM | POA: Diagnosis not present

## 2017-03-29 DIAGNOSIS — I10 Essential (primary) hypertension: Secondary | ICD-10-CM | POA: Diagnosis not present

## 2017-03-29 DIAGNOSIS — E46 Unspecified protein-calorie malnutrition: Secondary | ICD-10-CM | POA: Diagnosis not present

## 2017-03-29 DIAGNOSIS — R531 Weakness: Secondary | ICD-10-CM | POA: Diagnosis not present

## 2017-03-29 DIAGNOSIS — I639 Cerebral infarction, unspecified: Secondary | ICD-10-CM | POA: Diagnosis not present

## 2017-03-29 DIAGNOSIS — F329 Major depressive disorder, single episode, unspecified: Secondary | ICD-10-CM | POA: Diagnosis not present

## 2017-03-29 DIAGNOSIS — G250 Essential tremor: Secondary | ICD-10-CM | POA: Diagnosis not present

## 2017-03-29 DIAGNOSIS — E039 Hypothyroidism, unspecified: Secondary | ICD-10-CM | POA: Diagnosis not present

## 2017-03-29 DIAGNOSIS — G309 Alzheimer's disease, unspecified: Secondary | ICD-10-CM | POA: Diagnosis not present

## 2017-03-29 DIAGNOSIS — M1990 Unspecified osteoarthritis, unspecified site: Secondary | ICD-10-CM | POA: Diagnosis not present

## 2017-03-29 DIAGNOSIS — E785 Hyperlipidemia, unspecified: Secondary | ICD-10-CM | POA: Diagnosis not present

## 2017-04-02 DIAGNOSIS — F329 Major depressive disorder, single episode, unspecified: Secondary | ICD-10-CM | POA: Diagnosis not present

## 2017-04-02 DIAGNOSIS — E785 Hyperlipidemia, unspecified: Secondary | ICD-10-CM | POA: Diagnosis not present

## 2017-04-02 DIAGNOSIS — M1990 Unspecified osteoarthritis, unspecified site: Secondary | ICD-10-CM | POA: Diagnosis not present

## 2017-04-02 DIAGNOSIS — E039 Hypothyroidism, unspecified: Secondary | ICD-10-CM | POA: Diagnosis not present

## 2017-04-02 DIAGNOSIS — I10 Essential (primary) hypertension: Secondary | ICD-10-CM | POA: Diagnosis not present

## 2017-04-02 DIAGNOSIS — E46 Unspecified protein-calorie malnutrition: Secondary | ICD-10-CM | POA: Diagnosis not present

## 2017-04-03 DIAGNOSIS — F329 Major depressive disorder, single episode, unspecified: Secondary | ICD-10-CM | POA: Diagnosis not present

## 2017-04-03 DIAGNOSIS — E039 Hypothyroidism, unspecified: Secondary | ICD-10-CM | POA: Diagnosis not present

## 2017-04-03 DIAGNOSIS — E785 Hyperlipidemia, unspecified: Secondary | ICD-10-CM | POA: Diagnosis not present

## 2017-04-03 DIAGNOSIS — M1990 Unspecified osteoarthritis, unspecified site: Secondary | ICD-10-CM | POA: Diagnosis not present

## 2017-04-03 DIAGNOSIS — I10 Essential (primary) hypertension: Secondary | ICD-10-CM | POA: Diagnosis not present

## 2017-04-03 DIAGNOSIS — E46 Unspecified protein-calorie malnutrition: Secondary | ICD-10-CM | POA: Diagnosis not present

## 2017-04-04 DIAGNOSIS — E039 Hypothyroidism, unspecified: Secondary | ICD-10-CM | POA: Diagnosis not present

## 2017-04-04 DIAGNOSIS — F329 Major depressive disorder, single episode, unspecified: Secondary | ICD-10-CM | POA: Diagnosis not present

## 2017-04-04 DIAGNOSIS — I10 Essential (primary) hypertension: Secondary | ICD-10-CM | POA: Diagnosis not present

## 2017-04-04 DIAGNOSIS — M1990 Unspecified osteoarthritis, unspecified site: Secondary | ICD-10-CM | POA: Diagnosis not present

## 2017-04-04 DIAGNOSIS — E785 Hyperlipidemia, unspecified: Secondary | ICD-10-CM | POA: Diagnosis not present

## 2017-04-04 DIAGNOSIS — E46 Unspecified protein-calorie malnutrition: Secondary | ICD-10-CM | POA: Diagnosis not present

## 2017-04-05 DIAGNOSIS — M1990 Unspecified osteoarthritis, unspecified site: Secondary | ICD-10-CM | POA: Diagnosis not present

## 2017-04-05 DIAGNOSIS — I10 Essential (primary) hypertension: Secondary | ICD-10-CM | POA: Diagnosis not present

## 2017-04-05 DIAGNOSIS — E039 Hypothyroidism, unspecified: Secondary | ICD-10-CM | POA: Diagnosis not present

## 2017-04-05 DIAGNOSIS — E46 Unspecified protein-calorie malnutrition: Secondary | ICD-10-CM | POA: Diagnosis not present

## 2017-04-05 DIAGNOSIS — F329 Major depressive disorder, single episode, unspecified: Secondary | ICD-10-CM | POA: Diagnosis not present

## 2017-04-05 DIAGNOSIS — E785 Hyperlipidemia, unspecified: Secondary | ICD-10-CM | POA: Diagnosis not present

## 2017-04-08 DIAGNOSIS — F329 Major depressive disorder, single episode, unspecified: Secondary | ICD-10-CM | POA: Diagnosis not present

## 2017-04-08 DIAGNOSIS — I10 Essential (primary) hypertension: Secondary | ICD-10-CM | POA: Diagnosis not present

## 2017-04-08 DIAGNOSIS — E785 Hyperlipidemia, unspecified: Secondary | ICD-10-CM | POA: Diagnosis not present

## 2017-04-08 DIAGNOSIS — E039 Hypothyroidism, unspecified: Secondary | ICD-10-CM | POA: Diagnosis not present

## 2017-04-08 DIAGNOSIS — E46 Unspecified protein-calorie malnutrition: Secondary | ICD-10-CM | POA: Diagnosis not present

## 2017-04-08 DIAGNOSIS — M1990 Unspecified osteoarthritis, unspecified site: Secondary | ICD-10-CM | POA: Diagnosis not present

## 2017-04-09 DIAGNOSIS — I10 Essential (primary) hypertension: Secondary | ICD-10-CM | POA: Diagnosis not present

## 2017-04-09 DIAGNOSIS — E785 Hyperlipidemia, unspecified: Secondary | ICD-10-CM | POA: Diagnosis not present

## 2017-04-09 DIAGNOSIS — F329 Major depressive disorder, single episode, unspecified: Secondary | ICD-10-CM | POA: Diagnosis not present

## 2017-04-09 DIAGNOSIS — E039 Hypothyroidism, unspecified: Secondary | ICD-10-CM | POA: Diagnosis not present

## 2017-04-09 DIAGNOSIS — M1990 Unspecified osteoarthritis, unspecified site: Secondary | ICD-10-CM | POA: Diagnosis not present

## 2017-04-09 DIAGNOSIS — E46 Unspecified protein-calorie malnutrition: Secondary | ICD-10-CM | POA: Diagnosis not present

## 2017-04-10 DIAGNOSIS — E46 Unspecified protein-calorie malnutrition: Secondary | ICD-10-CM | POA: Diagnosis not present

## 2017-04-10 DIAGNOSIS — F329 Major depressive disorder, single episode, unspecified: Secondary | ICD-10-CM | POA: Diagnosis not present

## 2017-04-10 DIAGNOSIS — E039 Hypothyroidism, unspecified: Secondary | ICD-10-CM | POA: Diagnosis not present

## 2017-04-10 DIAGNOSIS — E785 Hyperlipidemia, unspecified: Secondary | ICD-10-CM | POA: Diagnosis not present

## 2017-04-10 DIAGNOSIS — I10 Essential (primary) hypertension: Secondary | ICD-10-CM | POA: Diagnosis not present

## 2017-04-10 DIAGNOSIS — M1990 Unspecified osteoarthritis, unspecified site: Secondary | ICD-10-CM | POA: Diagnosis not present

## 2017-04-11 DIAGNOSIS — M1990 Unspecified osteoarthritis, unspecified site: Secondary | ICD-10-CM | POA: Diagnosis not present

## 2017-04-11 DIAGNOSIS — I10 Essential (primary) hypertension: Secondary | ICD-10-CM | POA: Diagnosis not present

## 2017-04-11 DIAGNOSIS — E46 Unspecified protein-calorie malnutrition: Secondary | ICD-10-CM | POA: Diagnosis not present

## 2017-04-11 DIAGNOSIS — E039 Hypothyroidism, unspecified: Secondary | ICD-10-CM | POA: Diagnosis not present

## 2017-04-11 DIAGNOSIS — F329 Major depressive disorder, single episode, unspecified: Secondary | ICD-10-CM | POA: Diagnosis not present

## 2017-04-11 DIAGNOSIS — E785 Hyperlipidemia, unspecified: Secondary | ICD-10-CM | POA: Diagnosis not present

## 2017-04-12 DIAGNOSIS — E46 Unspecified protein-calorie malnutrition: Secondary | ICD-10-CM | POA: Diagnosis not present

## 2017-04-12 DIAGNOSIS — M1990 Unspecified osteoarthritis, unspecified site: Secondary | ICD-10-CM | POA: Diagnosis not present

## 2017-04-12 DIAGNOSIS — F329 Major depressive disorder, single episode, unspecified: Secondary | ICD-10-CM | POA: Diagnosis not present

## 2017-04-12 DIAGNOSIS — E039 Hypothyroidism, unspecified: Secondary | ICD-10-CM | POA: Diagnosis not present

## 2017-04-12 DIAGNOSIS — I10 Essential (primary) hypertension: Secondary | ICD-10-CM | POA: Diagnosis not present

## 2017-04-12 DIAGNOSIS — E785 Hyperlipidemia, unspecified: Secondary | ICD-10-CM | POA: Diagnosis not present

## 2017-04-16 DIAGNOSIS — M1990 Unspecified osteoarthritis, unspecified site: Secondary | ICD-10-CM | POA: Diagnosis not present

## 2017-04-16 DIAGNOSIS — E46 Unspecified protein-calorie malnutrition: Secondary | ICD-10-CM | POA: Diagnosis not present

## 2017-04-16 DIAGNOSIS — E039 Hypothyroidism, unspecified: Secondary | ICD-10-CM | POA: Diagnosis not present

## 2017-04-16 DIAGNOSIS — F329 Major depressive disorder, single episode, unspecified: Secondary | ICD-10-CM | POA: Diagnosis not present

## 2017-04-16 DIAGNOSIS — E785 Hyperlipidemia, unspecified: Secondary | ICD-10-CM | POA: Diagnosis not present

## 2017-04-16 DIAGNOSIS — I10 Essential (primary) hypertension: Secondary | ICD-10-CM | POA: Diagnosis not present

## 2017-04-17 DIAGNOSIS — E46 Unspecified protein-calorie malnutrition: Secondary | ICD-10-CM | POA: Diagnosis not present

## 2017-04-17 DIAGNOSIS — M1990 Unspecified osteoarthritis, unspecified site: Secondary | ICD-10-CM | POA: Diagnosis not present

## 2017-04-17 DIAGNOSIS — E785 Hyperlipidemia, unspecified: Secondary | ICD-10-CM | POA: Diagnosis not present

## 2017-04-17 DIAGNOSIS — F329 Major depressive disorder, single episode, unspecified: Secondary | ICD-10-CM | POA: Diagnosis not present

## 2017-04-17 DIAGNOSIS — I10 Essential (primary) hypertension: Secondary | ICD-10-CM | POA: Diagnosis not present

## 2017-04-17 DIAGNOSIS — E039 Hypothyroidism, unspecified: Secondary | ICD-10-CM | POA: Diagnosis not present

## 2017-04-18 DIAGNOSIS — F329 Major depressive disorder, single episode, unspecified: Secondary | ICD-10-CM | POA: Diagnosis not present

## 2017-04-18 DIAGNOSIS — I10 Essential (primary) hypertension: Secondary | ICD-10-CM | POA: Diagnosis not present

## 2017-04-18 DIAGNOSIS — E039 Hypothyroidism, unspecified: Secondary | ICD-10-CM | POA: Diagnosis not present

## 2017-04-18 DIAGNOSIS — M1990 Unspecified osteoarthritis, unspecified site: Secondary | ICD-10-CM | POA: Diagnosis not present

## 2017-04-18 DIAGNOSIS — E46 Unspecified protein-calorie malnutrition: Secondary | ICD-10-CM | POA: Diagnosis not present

## 2017-04-18 DIAGNOSIS — E785 Hyperlipidemia, unspecified: Secondary | ICD-10-CM | POA: Diagnosis not present

## 2017-04-19 DIAGNOSIS — E46 Unspecified protein-calorie malnutrition: Secondary | ICD-10-CM | POA: Diagnosis not present

## 2017-04-19 DIAGNOSIS — F329 Major depressive disorder, single episode, unspecified: Secondary | ICD-10-CM | POA: Diagnosis not present

## 2017-04-19 DIAGNOSIS — E785 Hyperlipidemia, unspecified: Secondary | ICD-10-CM | POA: Diagnosis not present

## 2017-04-19 DIAGNOSIS — M1990 Unspecified osteoarthritis, unspecified site: Secondary | ICD-10-CM | POA: Diagnosis not present

## 2017-04-19 DIAGNOSIS — I10 Essential (primary) hypertension: Secondary | ICD-10-CM | POA: Diagnosis not present

## 2017-04-19 DIAGNOSIS — E039 Hypothyroidism, unspecified: Secondary | ICD-10-CM | POA: Diagnosis not present

## 2017-04-22 DIAGNOSIS — E46 Unspecified protein-calorie malnutrition: Secondary | ICD-10-CM | POA: Diagnosis not present

## 2017-04-22 DIAGNOSIS — E039 Hypothyroidism, unspecified: Secondary | ICD-10-CM | POA: Diagnosis not present

## 2017-04-22 DIAGNOSIS — M1990 Unspecified osteoarthritis, unspecified site: Secondary | ICD-10-CM | POA: Diagnosis not present

## 2017-04-22 DIAGNOSIS — E785 Hyperlipidemia, unspecified: Secondary | ICD-10-CM | POA: Diagnosis not present

## 2017-04-22 DIAGNOSIS — F329 Major depressive disorder, single episode, unspecified: Secondary | ICD-10-CM | POA: Diagnosis not present

## 2017-04-22 DIAGNOSIS — I10 Essential (primary) hypertension: Secondary | ICD-10-CM | POA: Diagnosis not present

## 2017-04-23 DIAGNOSIS — E46 Unspecified protein-calorie malnutrition: Secondary | ICD-10-CM | POA: Diagnosis not present

## 2017-04-23 DIAGNOSIS — F329 Major depressive disorder, single episode, unspecified: Secondary | ICD-10-CM | POA: Diagnosis not present

## 2017-04-23 DIAGNOSIS — E785 Hyperlipidemia, unspecified: Secondary | ICD-10-CM | POA: Diagnosis not present

## 2017-04-23 DIAGNOSIS — E039 Hypothyroidism, unspecified: Secondary | ICD-10-CM | POA: Diagnosis not present

## 2017-04-23 DIAGNOSIS — M1990 Unspecified osteoarthritis, unspecified site: Secondary | ICD-10-CM | POA: Diagnosis not present

## 2017-04-23 DIAGNOSIS — I10 Essential (primary) hypertension: Secondary | ICD-10-CM | POA: Diagnosis not present

## 2017-04-24 DIAGNOSIS — E039 Hypothyroidism, unspecified: Secondary | ICD-10-CM | POA: Diagnosis not present

## 2017-04-24 DIAGNOSIS — F329 Major depressive disorder, single episode, unspecified: Secondary | ICD-10-CM | POA: Diagnosis not present

## 2017-04-24 DIAGNOSIS — E46 Unspecified protein-calorie malnutrition: Secondary | ICD-10-CM | POA: Diagnosis not present

## 2017-04-24 DIAGNOSIS — I10 Essential (primary) hypertension: Secondary | ICD-10-CM | POA: Diagnosis not present

## 2017-04-24 DIAGNOSIS — M1990 Unspecified osteoarthritis, unspecified site: Secondary | ICD-10-CM | POA: Diagnosis not present

## 2017-04-24 DIAGNOSIS — E785 Hyperlipidemia, unspecified: Secondary | ICD-10-CM | POA: Diagnosis not present

## 2017-04-25 DIAGNOSIS — I10 Essential (primary) hypertension: Secondary | ICD-10-CM | POA: Diagnosis not present

## 2017-04-25 DIAGNOSIS — E039 Hypothyroidism, unspecified: Secondary | ICD-10-CM | POA: Diagnosis not present

## 2017-04-25 DIAGNOSIS — E785 Hyperlipidemia, unspecified: Secondary | ICD-10-CM | POA: Diagnosis not present

## 2017-04-25 DIAGNOSIS — M1990 Unspecified osteoarthritis, unspecified site: Secondary | ICD-10-CM | POA: Diagnosis not present

## 2017-04-25 DIAGNOSIS — F329 Major depressive disorder, single episode, unspecified: Secondary | ICD-10-CM | POA: Diagnosis not present

## 2017-04-25 DIAGNOSIS — E46 Unspecified protein-calorie malnutrition: Secondary | ICD-10-CM | POA: Diagnosis not present

## 2017-04-26 DIAGNOSIS — I10 Essential (primary) hypertension: Secondary | ICD-10-CM | POA: Diagnosis not present

## 2017-04-26 DIAGNOSIS — M1990 Unspecified osteoarthritis, unspecified site: Secondary | ICD-10-CM | POA: Diagnosis not present

## 2017-04-26 DIAGNOSIS — E785 Hyperlipidemia, unspecified: Secondary | ICD-10-CM | POA: Diagnosis not present

## 2017-04-26 DIAGNOSIS — E46 Unspecified protein-calorie malnutrition: Secondary | ICD-10-CM | POA: Diagnosis not present

## 2017-04-26 DIAGNOSIS — F329 Major depressive disorder, single episode, unspecified: Secondary | ICD-10-CM | POA: Diagnosis not present

## 2017-04-26 DIAGNOSIS — E039 Hypothyroidism, unspecified: Secondary | ICD-10-CM | POA: Diagnosis not present

## 2017-04-28 DIAGNOSIS — I639 Cerebral infarction, unspecified: Secondary | ICD-10-CM | POA: Diagnosis not present

## 2017-04-28 DIAGNOSIS — E785 Hyperlipidemia, unspecified: Secondary | ICD-10-CM | POA: Diagnosis not present

## 2017-04-28 DIAGNOSIS — G309 Alzheimer's disease, unspecified: Secondary | ICD-10-CM | POA: Diagnosis not present

## 2017-04-28 DIAGNOSIS — R531 Weakness: Secondary | ICD-10-CM | POA: Diagnosis not present

## 2017-04-28 DIAGNOSIS — E039 Hypothyroidism, unspecified: Secondary | ICD-10-CM | POA: Diagnosis not present

## 2017-04-28 DIAGNOSIS — I10 Essential (primary) hypertension: Secondary | ICD-10-CM | POA: Diagnosis not present

## 2017-04-28 DIAGNOSIS — E46 Unspecified protein-calorie malnutrition: Secondary | ICD-10-CM | POA: Diagnosis not present

## 2017-04-28 DIAGNOSIS — F329 Major depressive disorder, single episode, unspecified: Secondary | ICD-10-CM | POA: Diagnosis not present

## 2017-04-28 DIAGNOSIS — G250 Essential tremor: Secondary | ICD-10-CM | POA: Diagnosis not present

## 2017-04-28 DIAGNOSIS — M1990 Unspecified osteoarthritis, unspecified site: Secondary | ICD-10-CM | POA: Diagnosis not present

## 2017-04-30 DIAGNOSIS — M1990 Unspecified osteoarthritis, unspecified site: Secondary | ICD-10-CM | POA: Diagnosis not present

## 2017-04-30 DIAGNOSIS — E039 Hypothyroidism, unspecified: Secondary | ICD-10-CM | POA: Diagnosis not present

## 2017-04-30 DIAGNOSIS — E46 Unspecified protein-calorie malnutrition: Secondary | ICD-10-CM | POA: Diagnosis not present

## 2017-04-30 DIAGNOSIS — F329 Major depressive disorder, single episode, unspecified: Secondary | ICD-10-CM | POA: Diagnosis not present

## 2017-04-30 DIAGNOSIS — E785 Hyperlipidemia, unspecified: Secondary | ICD-10-CM | POA: Diagnosis not present

## 2017-04-30 DIAGNOSIS — I10 Essential (primary) hypertension: Secondary | ICD-10-CM | POA: Diagnosis not present

## 2017-05-02 DIAGNOSIS — E46 Unspecified protein-calorie malnutrition: Secondary | ICD-10-CM | POA: Diagnosis not present

## 2017-05-02 DIAGNOSIS — M1990 Unspecified osteoarthritis, unspecified site: Secondary | ICD-10-CM | POA: Diagnosis not present

## 2017-05-02 DIAGNOSIS — F329 Major depressive disorder, single episode, unspecified: Secondary | ICD-10-CM | POA: Diagnosis not present

## 2017-05-02 DIAGNOSIS — E785 Hyperlipidemia, unspecified: Secondary | ICD-10-CM | POA: Diagnosis not present

## 2017-05-02 DIAGNOSIS — E039 Hypothyroidism, unspecified: Secondary | ICD-10-CM | POA: Diagnosis not present

## 2017-05-02 DIAGNOSIS — I10 Essential (primary) hypertension: Secondary | ICD-10-CM | POA: Diagnosis not present

## 2017-05-03 DIAGNOSIS — E46 Unspecified protein-calorie malnutrition: Secondary | ICD-10-CM | POA: Diagnosis not present

## 2017-05-03 DIAGNOSIS — I10 Essential (primary) hypertension: Secondary | ICD-10-CM | POA: Diagnosis not present

## 2017-05-03 DIAGNOSIS — M1990 Unspecified osteoarthritis, unspecified site: Secondary | ICD-10-CM | POA: Diagnosis not present

## 2017-05-03 DIAGNOSIS — E039 Hypothyroidism, unspecified: Secondary | ICD-10-CM | POA: Diagnosis not present

## 2017-05-03 DIAGNOSIS — E785 Hyperlipidemia, unspecified: Secondary | ICD-10-CM | POA: Diagnosis not present

## 2017-05-03 DIAGNOSIS — F329 Major depressive disorder, single episode, unspecified: Secondary | ICD-10-CM | POA: Diagnosis not present

## 2017-05-06 DIAGNOSIS — I10 Essential (primary) hypertension: Secondary | ICD-10-CM | POA: Diagnosis not present

## 2017-05-06 DIAGNOSIS — M1990 Unspecified osteoarthritis, unspecified site: Secondary | ICD-10-CM | POA: Diagnosis not present

## 2017-05-06 DIAGNOSIS — E785 Hyperlipidemia, unspecified: Secondary | ICD-10-CM | POA: Diagnosis not present

## 2017-05-06 DIAGNOSIS — F329 Major depressive disorder, single episode, unspecified: Secondary | ICD-10-CM | POA: Diagnosis not present

## 2017-05-06 DIAGNOSIS — E46 Unspecified protein-calorie malnutrition: Secondary | ICD-10-CM | POA: Diagnosis not present

## 2017-05-06 DIAGNOSIS — E039 Hypothyroidism, unspecified: Secondary | ICD-10-CM | POA: Diagnosis not present

## 2017-05-07 DIAGNOSIS — I10 Essential (primary) hypertension: Secondary | ICD-10-CM | POA: Diagnosis not present

## 2017-05-07 DIAGNOSIS — F329 Major depressive disorder, single episode, unspecified: Secondary | ICD-10-CM | POA: Diagnosis not present

## 2017-05-07 DIAGNOSIS — E46 Unspecified protein-calorie malnutrition: Secondary | ICD-10-CM | POA: Diagnosis not present

## 2017-05-07 DIAGNOSIS — M1990 Unspecified osteoarthritis, unspecified site: Secondary | ICD-10-CM | POA: Diagnosis not present

## 2017-05-07 DIAGNOSIS — E039 Hypothyroidism, unspecified: Secondary | ICD-10-CM | POA: Diagnosis not present

## 2017-05-07 DIAGNOSIS — E785 Hyperlipidemia, unspecified: Secondary | ICD-10-CM | POA: Diagnosis not present

## 2017-05-08 DIAGNOSIS — E46 Unspecified protein-calorie malnutrition: Secondary | ICD-10-CM | POA: Diagnosis not present

## 2017-05-08 DIAGNOSIS — I10 Essential (primary) hypertension: Secondary | ICD-10-CM | POA: Diagnosis not present

## 2017-05-08 DIAGNOSIS — F329 Major depressive disorder, single episode, unspecified: Secondary | ICD-10-CM | POA: Diagnosis not present

## 2017-05-08 DIAGNOSIS — E785 Hyperlipidemia, unspecified: Secondary | ICD-10-CM | POA: Diagnosis not present

## 2017-05-08 DIAGNOSIS — M1990 Unspecified osteoarthritis, unspecified site: Secondary | ICD-10-CM | POA: Diagnosis not present

## 2017-05-08 DIAGNOSIS — E039 Hypothyroidism, unspecified: Secondary | ICD-10-CM | POA: Diagnosis not present

## 2017-05-09 DIAGNOSIS — E785 Hyperlipidemia, unspecified: Secondary | ICD-10-CM | POA: Diagnosis not present

## 2017-05-09 DIAGNOSIS — E46 Unspecified protein-calorie malnutrition: Secondary | ICD-10-CM | POA: Diagnosis not present

## 2017-05-09 DIAGNOSIS — M1990 Unspecified osteoarthritis, unspecified site: Secondary | ICD-10-CM | POA: Diagnosis not present

## 2017-05-09 DIAGNOSIS — E039 Hypothyroidism, unspecified: Secondary | ICD-10-CM | POA: Diagnosis not present

## 2017-05-09 DIAGNOSIS — I10 Essential (primary) hypertension: Secondary | ICD-10-CM | POA: Diagnosis not present

## 2017-05-09 DIAGNOSIS — F329 Major depressive disorder, single episode, unspecified: Secondary | ICD-10-CM | POA: Diagnosis not present

## 2017-05-10 DIAGNOSIS — F329 Major depressive disorder, single episode, unspecified: Secondary | ICD-10-CM | POA: Diagnosis not present

## 2017-05-10 DIAGNOSIS — E785 Hyperlipidemia, unspecified: Secondary | ICD-10-CM | POA: Diagnosis not present

## 2017-05-10 DIAGNOSIS — E039 Hypothyroidism, unspecified: Secondary | ICD-10-CM | POA: Diagnosis not present

## 2017-05-10 DIAGNOSIS — E46 Unspecified protein-calorie malnutrition: Secondary | ICD-10-CM | POA: Diagnosis not present

## 2017-05-10 DIAGNOSIS — I10 Essential (primary) hypertension: Secondary | ICD-10-CM | POA: Diagnosis not present

## 2017-05-10 DIAGNOSIS — M1990 Unspecified osteoarthritis, unspecified site: Secondary | ICD-10-CM | POA: Diagnosis not present

## 2017-05-13 DIAGNOSIS — E039 Hypothyroidism, unspecified: Secondary | ICD-10-CM | POA: Diagnosis not present

## 2017-05-13 DIAGNOSIS — E46 Unspecified protein-calorie malnutrition: Secondary | ICD-10-CM | POA: Diagnosis not present

## 2017-05-13 DIAGNOSIS — E785 Hyperlipidemia, unspecified: Secondary | ICD-10-CM | POA: Diagnosis not present

## 2017-05-13 DIAGNOSIS — F329 Major depressive disorder, single episode, unspecified: Secondary | ICD-10-CM | POA: Diagnosis not present

## 2017-05-13 DIAGNOSIS — I10 Essential (primary) hypertension: Secondary | ICD-10-CM | POA: Diagnosis not present

## 2017-05-13 DIAGNOSIS — M1990 Unspecified osteoarthritis, unspecified site: Secondary | ICD-10-CM | POA: Diagnosis not present

## 2017-05-14 DIAGNOSIS — E039 Hypothyroidism, unspecified: Secondary | ICD-10-CM | POA: Diagnosis not present

## 2017-05-14 DIAGNOSIS — E785 Hyperlipidemia, unspecified: Secondary | ICD-10-CM | POA: Diagnosis not present

## 2017-05-14 DIAGNOSIS — I10 Essential (primary) hypertension: Secondary | ICD-10-CM | POA: Diagnosis not present

## 2017-05-14 DIAGNOSIS — M1990 Unspecified osteoarthritis, unspecified site: Secondary | ICD-10-CM | POA: Diagnosis not present

## 2017-05-14 DIAGNOSIS — E46 Unspecified protein-calorie malnutrition: Secondary | ICD-10-CM | POA: Diagnosis not present

## 2017-05-14 DIAGNOSIS — F329 Major depressive disorder, single episode, unspecified: Secondary | ICD-10-CM | POA: Diagnosis not present

## 2017-05-15 DIAGNOSIS — F329 Major depressive disorder, single episode, unspecified: Secondary | ICD-10-CM | POA: Diagnosis not present

## 2017-05-15 DIAGNOSIS — I10 Essential (primary) hypertension: Secondary | ICD-10-CM | POA: Diagnosis not present

## 2017-05-15 DIAGNOSIS — E039 Hypothyroidism, unspecified: Secondary | ICD-10-CM | POA: Diagnosis not present

## 2017-05-15 DIAGNOSIS — M1990 Unspecified osteoarthritis, unspecified site: Secondary | ICD-10-CM | POA: Diagnosis not present

## 2017-05-15 DIAGNOSIS — E46 Unspecified protein-calorie malnutrition: Secondary | ICD-10-CM | POA: Diagnosis not present

## 2017-05-15 DIAGNOSIS — E785 Hyperlipidemia, unspecified: Secondary | ICD-10-CM | POA: Diagnosis not present

## 2017-05-20 DIAGNOSIS — M1990 Unspecified osteoarthritis, unspecified site: Secondary | ICD-10-CM | POA: Diagnosis not present

## 2017-05-20 DIAGNOSIS — I10 Essential (primary) hypertension: Secondary | ICD-10-CM | POA: Diagnosis not present

## 2017-05-20 DIAGNOSIS — E039 Hypothyroidism, unspecified: Secondary | ICD-10-CM | POA: Diagnosis not present

## 2017-05-20 DIAGNOSIS — E46 Unspecified protein-calorie malnutrition: Secondary | ICD-10-CM | POA: Diagnosis not present

## 2017-05-20 DIAGNOSIS — F329 Major depressive disorder, single episode, unspecified: Secondary | ICD-10-CM | POA: Diagnosis not present

## 2017-05-20 DIAGNOSIS — E785 Hyperlipidemia, unspecified: Secondary | ICD-10-CM | POA: Diagnosis not present

## 2017-05-21 DIAGNOSIS — E785 Hyperlipidemia, unspecified: Secondary | ICD-10-CM | POA: Diagnosis not present

## 2017-05-21 DIAGNOSIS — I10 Essential (primary) hypertension: Secondary | ICD-10-CM | POA: Diagnosis not present

## 2017-05-21 DIAGNOSIS — E039 Hypothyroidism, unspecified: Secondary | ICD-10-CM | POA: Diagnosis not present

## 2017-05-21 DIAGNOSIS — M1990 Unspecified osteoarthritis, unspecified site: Secondary | ICD-10-CM | POA: Diagnosis not present

## 2017-05-21 DIAGNOSIS — E46 Unspecified protein-calorie malnutrition: Secondary | ICD-10-CM | POA: Diagnosis not present

## 2017-05-21 DIAGNOSIS — F329 Major depressive disorder, single episode, unspecified: Secondary | ICD-10-CM | POA: Diagnosis not present

## 2017-05-22 DIAGNOSIS — E785 Hyperlipidemia, unspecified: Secondary | ICD-10-CM | POA: Diagnosis not present

## 2017-05-22 DIAGNOSIS — M1990 Unspecified osteoarthritis, unspecified site: Secondary | ICD-10-CM | POA: Diagnosis not present

## 2017-05-22 DIAGNOSIS — I10 Essential (primary) hypertension: Secondary | ICD-10-CM | POA: Diagnosis not present

## 2017-05-22 DIAGNOSIS — E46 Unspecified protein-calorie malnutrition: Secondary | ICD-10-CM | POA: Diagnosis not present

## 2017-05-22 DIAGNOSIS — E039 Hypothyroidism, unspecified: Secondary | ICD-10-CM | POA: Diagnosis not present

## 2017-05-22 DIAGNOSIS — F329 Major depressive disorder, single episode, unspecified: Secondary | ICD-10-CM | POA: Diagnosis not present

## 2017-05-24 DIAGNOSIS — E785 Hyperlipidemia, unspecified: Secondary | ICD-10-CM | POA: Diagnosis not present

## 2017-05-24 DIAGNOSIS — E46 Unspecified protein-calorie malnutrition: Secondary | ICD-10-CM | POA: Diagnosis not present

## 2017-05-24 DIAGNOSIS — E039 Hypothyroidism, unspecified: Secondary | ICD-10-CM | POA: Diagnosis not present

## 2017-05-24 DIAGNOSIS — F329 Major depressive disorder, single episode, unspecified: Secondary | ICD-10-CM | POA: Diagnosis not present

## 2017-05-24 DIAGNOSIS — I10 Essential (primary) hypertension: Secondary | ICD-10-CM | POA: Diagnosis not present

## 2017-05-24 DIAGNOSIS — M1990 Unspecified osteoarthritis, unspecified site: Secondary | ICD-10-CM | POA: Diagnosis not present

## 2017-05-27 DIAGNOSIS — E785 Hyperlipidemia, unspecified: Secondary | ICD-10-CM | POA: Diagnosis not present

## 2017-05-27 DIAGNOSIS — I10 Essential (primary) hypertension: Secondary | ICD-10-CM | POA: Diagnosis not present

## 2017-05-27 DIAGNOSIS — F329 Major depressive disorder, single episode, unspecified: Secondary | ICD-10-CM | POA: Diagnosis not present

## 2017-05-27 DIAGNOSIS — E46 Unspecified protein-calorie malnutrition: Secondary | ICD-10-CM | POA: Diagnosis not present

## 2017-05-27 DIAGNOSIS — E039 Hypothyroidism, unspecified: Secondary | ICD-10-CM | POA: Diagnosis not present

## 2017-05-27 DIAGNOSIS — M1990 Unspecified osteoarthritis, unspecified site: Secondary | ICD-10-CM | POA: Diagnosis not present

## 2017-05-28 DIAGNOSIS — F329 Major depressive disorder, single episode, unspecified: Secondary | ICD-10-CM | POA: Diagnosis not present

## 2017-05-28 DIAGNOSIS — E039 Hypothyroidism, unspecified: Secondary | ICD-10-CM | POA: Diagnosis not present

## 2017-05-28 DIAGNOSIS — M1990 Unspecified osteoarthritis, unspecified site: Secondary | ICD-10-CM | POA: Diagnosis not present

## 2017-05-28 DIAGNOSIS — I10 Essential (primary) hypertension: Secondary | ICD-10-CM | POA: Diagnosis not present

## 2017-05-28 DIAGNOSIS — E785 Hyperlipidemia, unspecified: Secondary | ICD-10-CM | POA: Diagnosis not present

## 2017-05-28 DIAGNOSIS — E46 Unspecified protein-calorie malnutrition: Secondary | ICD-10-CM | POA: Diagnosis not present

## 2017-05-29 DIAGNOSIS — E039 Hypothyroidism, unspecified: Secondary | ICD-10-CM | POA: Diagnosis not present

## 2017-05-29 DIAGNOSIS — E785 Hyperlipidemia, unspecified: Secondary | ICD-10-CM | POA: Diagnosis not present

## 2017-05-29 DIAGNOSIS — R531 Weakness: Secondary | ICD-10-CM | POA: Diagnosis not present

## 2017-05-29 DIAGNOSIS — F329 Major depressive disorder, single episode, unspecified: Secondary | ICD-10-CM | POA: Diagnosis not present

## 2017-05-29 DIAGNOSIS — G250 Essential tremor: Secondary | ICD-10-CM | POA: Diagnosis not present

## 2017-05-29 DIAGNOSIS — E46 Unspecified protein-calorie malnutrition: Secondary | ICD-10-CM | POA: Diagnosis not present

## 2017-05-29 DIAGNOSIS — I10 Essential (primary) hypertension: Secondary | ICD-10-CM | POA: Diagnosis not present

## 2017-05-29 DIAGNOSIS — M1990 Unspecified osteoarthritis, unspecified site: Secondary | ICD-10-CM | POA: Diagnosis not present

## 2017-05-29 DIAGNOSIS — G309 Alzheimer's disease, unspecified: Secondary | ICD-10-CM | POA: Diagnosis not present

## 2017-05-29 DIAGNOSIS — I639 Cerebral infarction, unspecified: Secondary | ICD-10-CM | POA: Diagnosis not present

## 2017-05-30 DIAGNOSIS — E46 Unspecified protein-calorie malnutrition: Secondary | ICD-10-CM | POA: Diagnosis not present

## 2017-05-30 DIAGNOSIS — I10 Essential (primary) hypertension: Secondary | ICD-10-CM | POA: Diagnosis not present

## 2017-05-30 DIAGNOSIS — E039 Hypothyroidism, unspecified: Secondary | ICD-10-CM | POA: Diagnosis not present

## 2017-05-30 DIAGNOSIS — F329 Major depressive disorder, single episode, unspecified: Secondary | ICD-10-CM | POA: Diagnosis not present

## 2017-05-30 DIAGNOSIS — E785 Hyperlipidemia, unspecified: Secondary | ICD-10-CM | POA: Diagnosis not present

## 2017-05-30 DIAGNOSIS — M1990 Unspecified osteoarthritis, unspecified site: Secondary | ICD-10-CM | POA: Diagnosis not present

## 2017-05-31 DIAGNOSIS — E785 Hyperlipidemia, unspecified: Secondary | ICD-10-CM | POA: Diagnosis not present

## 2017-05-31 DIAGNOSIS — I10 Essential (primary) hypertension: Secondary | ICD-10-CM | POA: Diagnosis not present

## 2017-05-31 DIAGNOSIS — F329 Major depressive disorder, single episode, unspecified: Secondary | ICD-10-CM | POA: Diagnosis not present

## 2017-05-31 DIAGNOSIS — M1990 Unspecified osteoarthritis, unspecified site: Secondary | ICD-10-CM | POA: Diagnosis not present

## 2017-05-31 DIAGNOSIS — E039 Hypothyroidism, unspecified: Secondary | ICD-10-CM | POA: Diagnosis not present

## 2017-05-31 DIAGNOSIS — E46 Unspecified protein-calorie malnutrition: Secondary | ICD-10-CM | POA: Diagnosis not present

## 2017-06-03 DIAGNOSIS — E46 Unspecified protein-calorie malnutrition: Secondary | ICD-10-CM | POA: Diagnosis not present

## 2017-06-03 DIAGNOSIS — F329 Major depressive disorder, single episode, unspecified: Secondary | ICD-10-CM | POA: Diagnosis not present

## 2017-06-03 DIAGNOSIS — E785 Hyperlipidemia, unspecified: Secondary | ICD-10-CM | POA: Diagnosis not present

## 2017-06-03 DIAGNOSIS — E039 Hypothyroidism, unspecified: Secondary | ICD-10-CM | POA: Diagnosis not present

## 2017-06-03 DIAGNOSIS — M1990 Unspecified osteoarthritis, unspecified site: Secondary | ICD-10-CM | POA: Diagnosis not present

## 2017-06-03 DIAGNOSIS — I10 Essential (primary) hypertension: Secondary | ICD-10-CM | POA: Diagnosis not present

## 2017-06-04 DIAGNOSIS — F329 Major depressive disorder, single episode, unspecified: Secondary | ICD-10-CM | POA: Diagnosis not present

## 2017-06-04 DIAGNOSIS — E039 Hypothyroidism, unspecified: Secondary | ICD-10-CM | POA: Diagnosis not present

## 2017-06-04 DIAGNOSIS — I10 Essential (primary) hypertension: Secondary | ICD-10-CM | POA: Diagnosis not present

## 2017-06-04 DIAGNOSIS — E46 Unspecified protein-calorie malnutrition: Secondary | ICD-10-CM | POA: Diagnosis not present

## 2017-06-04 DIAGNOSIS — M1990 Unspecified osteoarthritis, unspecified site: Secondary | ICD-10-CM | POA: Diagnosis not present

## 2017-06-04 DIAGNOSIS — E785 Hyperlipidemia, unspecified: Secondary | ICD-10-CM | POA: Diagnosis not present

## 2017-06-06 DIAGNOSIS — I10 Essential (primary) hypertension: Secondary | ICD-10-CM | POA: Diagnosis not present

## 2017-06-06 DIAGNOSIS — E039 Hypothyroidism, unspecified: Secondary | ICD-10-CM | POA: Diagnosis not present

## 2017-06-06 DIAGNOSIS — F329 Major depressive disorder, single episode, unspecified: Secondary | ICD-10-CM | POA: Diagnosis not present

## 2017-06-06 DIAGNOSIS — E785 Hyperlipidemia, unspecified: Secondary | ICD-10-CM | POA: Diagnosis not present

## 2017-06-06 DIAGNOSIS — E46 Unspecified protein-calorie malnutrition: Secondary | ICD-10-CM | POA: Diagnosis not present

## 2017-06-06 DIAGNOSIS — M1990 Unspecified osteoarthritis, unspecified site: Secondary | ICD-10-CM | POA: Diagnosis not present

## 2017-06-07 DIAGNOSIS — E46 Unspecified protein-calorie malnutrition: Secondary | ICD-10-CM | POA: Diagnosis not present

## 2017-06-07 DIAGNOSIS — E039 Hypothyroidism, unspecified: Secondary | ICD-10-CM | POA: Diagnosis not present

## 2017-06-07 DIAGNOSIS — E785 Hyperlipidemia, unspecified: Secondary | ICD-10-CM | POA: Diagnosis not present

## 2017-06-07 DIAGNOSIS — F329 Major depressive disorder, single episode, unspecified: Secondary | ICD-10-CM | POA: Diagnosis not present

## 2017-06-07 DIAGNOSIS — I10 Essential (primary) hypertension: Secondary | ICD-10-CM | POA: Diagnosis not present

## 2017-06-07 DIAGNOSIS — M1990 Unspecified osteoarthritis, unspecified site: Secondary | ICD-10-CM | POA: Diagnosis not present

## 2017-06-11 DIAGNOSIS — F329 Major depressive disorder, single episode, unspecified: Secondary | ICD-10-CM | POA: Diagnosis not present

## 2017-06-11 DIAGNOSIS — I10 Essential (primary) hypertension: Secondary | ICD-10-CM | POA: Diagnosis not present

## 2017-06-11 DIAGNOSIS — E039 Hypothyroidism, unspecified: Secondary | ICD-10-CM | POA: Diagnosis not present

## 2017-06-11 DIAGNOSIS — M1990 Unspecified osteoarthritis, unspecified site: Secondary | ICD-10-CM | POA: Diagnosis not present

## 2017-06-11 DIAGNOSIS — E46 Unspecified protein-calorie malnutrition: Secondary | ICD-10-CM | POA: Diagnosis not present

## 2017-06-11 DIAGNOSIS — E785 Hyperlipidemia, unspecified: Secondary | ICD-10-CM | POA: Diagnosis not present

## 2017-06-12 DIAGNOSIS — F329 Major depressive disorder, single episode, unspecified: Secondary | ICD-10-CM | POA: Diagnosis not present

## 2017-06-12 DIAGNOSIS — E785 Hyperlipidemia, unspecified: Secondary | ICD-10-CM | POA: Diagnosis not present

## 2017-06-12 DIAGNOSIS — M1990 Unspecified osteoarthritis, unspecified site: Secondary | ICD-10-CM | POA: Diagnosis not present

## 2017-06-12 DIAGNOSIS — E039 Hypothyroidism, unspecified: Secondary | ICD-10-CM | POA: Diagnosis not present

## 2017-06-12 DIAGNOSIS — E46 Unspecified protein-calorie malnutrition: Secondary | ICD-10-CM | POA: Diagnosis not present

## 2017-06-12 DIAGNOSIS — I10 Essential (primary) hypertension: Secondary | ICD-10-CM | POA: Diagnosis not present

## 2017-06-13 DIAGNOSIS — F329 Major depressive disorder, single episode, unspecified: Secondary | ICD-10-CM | POA: Diagnosis not present

## 2017-06-13 DIAGNOSIS — E039 Hypothyroidism, unspecified: Secondary | ICD-10-CM | POA: Diagnosis not present

## 2017-06-13 DIAGNOSIS — E46 Unspecified protein-calorie malnutrition: Secondary | ICD-10-CM | POA: Diagnosis not present

## 2017-06-13 DIAGNOSIS — M1990 Unspecified osteoarthritis, unspecified site: Secondary | ICD-10-CM | POA: Diagnosis not present

## 2017-06-13 DIAGNOSIS — E785 Hyperlipidemia, unspecified: Secondary | ICD-10-CM | POA: Diagnosis not present

## 2017-06-13 DIAGNOSIS — I10 Essential (primary) hypertension: Secondary | ICD-10-CM | POA: Diagnosis not present

## 2017-06-14 DIAGNOSIS — E039 Hypothyroidism, unspecified: Secondary | ICD-10-CM | POA: Diagnosis not present

## 2017-06-14 DIAGNOSIS — I10 Essential (primary) hypertension: Secondary | ICD-10-CM | POA: Diagnosis not present

## 2017-06-14 DIAGNOSIS — M1990 Unspecified osteoarthritis, unspecified site: Secondary | ICD-10-CM | POA: Diagnosis not present

## 2017-06-14 DIAGNOSIS — E785 Hyperlipidemia, unspecified: Secondary | ICD-10-CM | POA: Diagnosis not present

## 2017-06-14 DIAGNOSIS — F329 Major depressive disorder, single episode, unspecified: Secondary | ICD-10-CM | POA: Diagnosis not present

## 2017-06-14 DIAGNOSIS — E46 Unspecified protein-calorie malnutrition: Secondary | ICD-10-CM | POA: Diagnosis not present

## 2017-06-17 DIAGNOSIS — I10 Essential (primary) hypertension: Secondary | ICD-10-CM | POA: Diagnosis not present

## 2017-06-17 DIAGNOSIS — E039 Hypothyroidism, unspecified: Secondary | ICD-10-CM | POA: Diagnosis not present

## 2017-06-17 DIAGNOSIS — E785 Hyperlipidemia, unspecified: Secondary | ICD-10-CM | POA: Diagnosis not present

## 2017-06-17 DIAGNOSIS — M1990 Unspecified osteoarthritis, unspecified site: Secondary | ICD-10-CM | POA: Diagnosis not present

## 2017-06-17 DIAGNOSIS — E46 Unspecified protein-calorie malnutrition: Secondary | ICD-10-CM | POA: Diagnosis not present

## 2017-06-17 DIAGNOSIS — F329 Major depressive disorder, single episode, unspecified: Secondary | ICD-10-CM | POA: Diagnosis not present

## 2017-06-18 DIAGNOSIS — F329 Major depressive disorder, single episode, unspecified: Secondary | ICD-10-CM | POA: Diagnosis not present

## 2017-06-18 DIAGNOSIS — I10 Essential (primary) hypertension: Secondary | ICD-10-CM | POA: Diagnosis not present

## 2017-06-18 DIAGNOSIS — E46 Unspecified protein-calorie malnutrition: Secondary | ICD-10-CM | POA: Diagnosis not present

## 2017-06-18 DIAGNOSIS — E785 Hyperlipidemia, unspecified: Secondary | ICD-10-CM | POA: Diagnosis not present

## 2017-06-18 DIAGNOSIS — M1990 Unspecified osteoarthritis, unspecified site: Secondary | ICD-10-CM | POA: Diagnosis not present

## 2017-06-18 DIAGNOSIS — E039 Hypothyroidism, unspecified: Secondary | ICD-10-CM | POA: Diagnosis not present

## 2017-06-19 DIAGNOSIS — E785 Hyperlipidemia, unspecified: Secondary | ICD-10-CM | POA: Diagnosis not present

## 2017-06-19 DIAGNOSIS — F329 Major depressive disorder, single episode, unspecified: Secondary | ICD-10-CM | POA: Diagnosis not present

## 2017-06-19 DIAGNOSIS — E039 Hypothyroidism, unspecified: Secondary | ICD-10-CM | POA: Diagnosis not present

## 2017-06-19 DIAGNOSIS — I10 Essential (primary) hypertension: Secondary | ICD-10-CM | POA: Diagnosis not present

## 2017-06-19 DIAGNOSIS — E46 Unspecified protein-calorie malnutrition: Secondary | ICD-10-CM | POA: Diagnosis not present

## 2017-06-19 DIAGNOSIS — M1990 Unspecified osteoarthritis, unspecified site: Secondary | ICD-10-CM | POA: Diagnosis not present

## 2017-06-21 DIAGNOSIS — E46 Unspecified protein-calorie malnutrition: Secondary | ICD-10-CM | POA: Diagnosis not present

## 2017-06-21 DIAGNOSIS — E785 Hyperlipidemia, unspecified: Secondary | ICD-10-CM | POA: Diagnosis not present

## 2017-06-21 DIAGNOSIS — M1990 Unspecified osteoarthritis, unspecified site: Secondary | ICD-10-CM | POA: Diagnosis not present

## 2017-06-21 DIAGNOSIS — I10 Essential (primary) hypertension: Secondary | ICD-10-CM | POA: Diagnosis not present

## 2017-06-21 DIAGNOSIS — E039 Hypothyroidism, unspecified: Secondary | ICD-10-CM | POA: Diagnosis not present

## 2017-06-21 DIAGNOSIS — F329 Major depressive disorder, single episode, unspecified: Secondary | ICD-10-CM | POA: Diagnosis not present

## 2017-06-25 DIAGNOSIS — E039 Hypothyroidism, unspecified: Secondary | ICD-10-CM | POA: Diagnosis not present

## 2017-06-25 DIAGNOSIS — I10 Essential (primary) hypertension: Secondary | ICD-10-CM | POA: Diagnosis not present

## 2017-06-25 DIAGNOSIS — F329 Major depressive disorder, single episode, unspecified: Secondary | ICD-10-CM | POA: Diagnosis not present

## 2017-06-25 DIAGNOSIS — M1990 Unspecified osteoarthritis, unspecified site: Secondary | ICD-10-CM | POA: Diagnosis not present

## 2017-06-25 DIAGNOSIS — E46 Unspecified protein-calorie malnutrition: Secondary | ICD-10-CM | POA: Diagnosis not present

## 2017-06-25 DIAGNOSIS — E785 Hyperlipidemia, unspecified: Secondary | ICD-10-CM | POA: Diagnosis not present

## 2017-06-26 DIAGNOSIS — E039 Hypothyroidism, unspecified: Secondary | ICD-10-CM | POA: Diagnosis not present

## 2017-06-26 DIAGNOSIS — M1990 Unspecified osteoarthritis, unspecified site: Secondary | ICD-10-CM | POA: Diagnosis not present

## 2017-06-26 DIAGNOSIS — F329 Major depressive disorder, single episode, unspecified: Secondary | ICD-10-CM | POA: Diagnosis not present

## 2017-06-26 DIAGNOSIS — I10 Essential (primary) hypertension: Secondary | ICD-10-CM | POA: Diagnosis not present

## 2017-06-26 DIAGNOSIS — E46 Unspecified protein-calorie malnutrition: Secondary | ICD-10-CM | POA: Diagnosis not present

## 2017-06-26 DIAGNOSIS — E785 Hyperlipidemia, unspecified: Secondary | ICD-10-CM | POA: Diagnosis not present

## 2017-06-27 DIAGNOSIS — M1990 Unspecified osteoarthritis, unspecified site: Secondary | ICD-10-CM | POA: Diagnosis not present

## 2017-06-27 DIAGNOSIS — I10 Essential (primary) hypertension: Secondary | ICD-10-CM | POA: Diagnosis not present

## 2017-06-27 DIAGNOSIS — E785 Hyperlipidemia, unspecified: Secondary | ICD-10-CM | POA: Diagnosis not present

## 2017-06-27 DIAGNOSIS — E039 Hypothyroidism, unspecified: Secondary | ICD-10-CM | POA: Diagnosis not present

## 2017-06-27 DIAGNOSIS — E46 Unspecified protein-calorie malnutrition: Secondary | ICD-10-CM | POA: Diagnosis not present

## 2017-06-27 DIAGNOSIS — F329 Major depressive disorder, single episode, unspecified: Secondary | ICD-10-CM | POA: Diagnosis not present

## 2017-06-28 DIAGNOSIS — E039 Hypothyroidism, unspecified: Secondary | ICD-10-CM | POA: Diagnosis not present

## 2017-06-28 DIAGNOSIS — E46 Unspecified protein-calorie malnutrition: Secondary | ICD-10-CM | POA: Diagnosis not present

## 2017-06-28 DIAGNOSIS — F329 Major depressive disorder, single episode, unspecified: Secondary | ICD-10-CM | POA: Diagnosis not present

## 2017-06-28 DIAGNOSIS — E785 Hyperlipidemia, unspecified: Secondary | ICD-10-CM | POA: Diagnosis not present

## 2017-06-28 DIAGNOSIS — M1990 Unspecified osteoarthritis, unspecified site: Secondary | ICD-10-CM | POA: Diagnosis not present

## 2017-06-28 DIAGNOSIS — I10 Essential (primary) hypertension: Secondary | ICD-10-CM | POA: Diagnosis not present

## 2017-06-29 DIAGNOSIS — I639 Cerebral infarction, unspecified: Secondary | ICD-10-CM | POA: Diagnosis not present

## 2017-06-29 DIAGNOSIS — F329 Major depressive disorder, single episode, unspecified: Secondary | ICD-10-CM | POA: Diagnosis not present

## 2017-06-29 DIAGNOSIS — G309 Alzheimer's disease, unspecified: Secondary | ICD-10-CM | POA: Diagnosis not present

## 2017-06-29 DIAGNOSIS — E785 Hyperlipidemia, unspecified: Secondary | ICD-10-CM | POA: Diagnosis not present

## 2017-06-29 DIAGNOSIS — M1990 Unspecified osteoarthritis, unspecified site: Secondary | ICD-10-CM | POA: Diagnosis not present

## 2017-06-29 DIAGNOSIS — I10 Essential (primary) hypertension: Secondary | ICD-10-CM | POA: Diagnosis not present

## 2017-06-29 DIAGNOSIS — G250 Essential tremor: Secondary | ICD-10-CM | POA: Diagnosis not present

## 2017-06-29 DIAGNOSIS — E039 Hypothyroidism, unspecified: Secondary | ICD-10-CM | POA: Diagnosis not present

## 2017-06-29 DIAGNOSIS — E46 Unspecified protein-calorie malnutrition: Secondary | ICD-10-CM | POA: Diagnosis not present

## 2017-06-29 DIAGNOSIS — R531 Weakness: Secondary | ICD-10-CM | POA: Diagnosis not present

## 2017-07-02 DIAGNOSIS — I10 Essential (primary) hypertension: Secondary | ICD-10-CM | POA: Diagnosis not present

## 2017-07-02 DIAGNOSIS — E785 Hyperlipidemia, unspecified: Secondary | ICD-10-CM | POA: Diagnosis not present

## 2017-07-02 DIAGNOSIS — E039 Hypothyroidism, unspecified: Secondary | ICD-10-CM | POA: Diagnosis not present

## 2017-07-02 DIAGNOSIS — G309 Alzheimer's disease, unspecified: Secondary | ICD-10-CM | POA: Diagnosis not present

## 2017-07-02 DIAGNOSIS — M1990 Unspecified osteoarthritis, unspecified site: Secondary | ICD-10-CM | POA: Diagnosis not present

## 2017-07-02 DIAGNOSIS — F329 Major depressive disorder, single episode, unspecified: Secondary | ICD-10-CM | POA: Diagnosis not present

## 2017-07-03 DIAGNOSIS — F329 Major depressive disorder, single episode, unspecified: Secondary | ICD-10-CM | POA: Diagnosis not present

## 2017-07-03 DIAGNOSIS — E785 Hyperlipidemia, unspecified: Secondary | ICD-10-CM | POA: Diagnosis not present

## 2017-07-03 DIAGNOSIS — M1990 Unspecified osteoarthritis, unspecified site: Secondary | ICD-10-CM | POA: Diagnosis not present

## 2017-07-03 DIAGNOSIS — I10 Essential (primary) hypertension: Secondary | ICD-10-CM | POA: Diagnosis not present

## 2017-07-03 DIAGNOSIS — E039 Hypothyroidism, unspecified: Secondary | ICD-10-CM | POA: Diagnosis not present

## 2017-07-03 DIAGNOSIS — G309 Alzheimer's disease, unspecified: Secondary | ICD-10-CM | POA: Diagnosis not present

## 2017-07-08 DIAGNOSIS — E785 Hyperlipidemia, unspecified: Secondary | ICD-10-CM | POA: Diagnosis not present

## 2017-07-08 DIAGNOSIS — E039 Hypothyroidism, unspecified: Secondary | ICD-10-CM | POA: Diagnosis not present

## 2017-07-08 DIAGNOSIS — M1990 Unspecified osteoarthritis, unspecified site: Secondary | ICD-10-CM | POA: Diagnosis not present

## 2017-07-08 DIAGNOSIS — G309 Alzheimer's disease, unspecified: Secondary | ICD-10-CM | POA: Diagnosis not present

## 2017-07-08 DIAGNOSIS — F329 Major depressive disorder, single episode, unspecified: Secondary | ICD-10-CM | POA: Diagnosis not present

## 2017-07-08 DIAGNOSIS — I10 Essential (primary) hypertension: Secondary | ICD-10-CM | POA: Diagnosis not present

## 2017-07-09 DIAGNOSIS — G309 Alzheimer's disease, unspecified: Secondary | ICD-10-CM | POA: Diagnosis not present

## 2017-07-09 DIAGNOSIS — M1990 Unspecified osteoarthritis, unspecified site: Secondary | ICD-10-CM | POA: Diagnosis not present

## 2017-07-09 DIAGNOSIS — E039 Hypothyroidism, unspecified: Secondary | ICD-10-CM | POA: Diagnosis not present

## 2017-07-09 DIAGNOSIS — F329 Major depressive disorder, single episode, unspecified: Secondary | ICD-10-CM | POA: Diagnosis not present

## 2017-07-09 DIAGNOSIS — I10 Essential (primary) hypertension: Secondary | ICD-10-CM | POA: Diagnosis not present

## 2017-07-09 DIAGNOSIS — E785 Hyperlipidemia, unspecified: Secondary | ICD-10-CM | POA: Diagnosis not present

## 2017-07-10 DIAGNOSIS — E785 Hyperlipidemia, unspecified: Secondary | ICD-10-CM | POA: Diagnosis not present

## 2017-07-10 DIAGNOSIS — M1990 Unspecified osteoarthritis, unspecified site: Secondary | ICD-10-CM | POA: Diagnosis not present

## 2017-07-10 DIAGNOSIS — I10 Essential (primary) hypertension: Secondary | ICD-10-CM | POA: Diagnosis not present

## 2017-07-10 DIAGNOSIS — F329 Major depressive disorder, single episode, unspecified: Secondary | ICD-10-CM | POA: Diagnosis not present

## 2017-07-10 DIAGNOSIS — E039 Hypothyroidism, unspecified: Secondary | ICD-10-CM | POA: Diagnosis not present

## 2017-07-10 DIAGNOSIS — G309 Alzheimer's disease, unspecified: Secondary | ICD-10-CM | POA: Diagnosis not present

## 2017-07-11 DIAGNOSIS — E039 Hypothyroidism, unspecified: Secondary | ICD-10-CM | POA: Diagnosis not present

## 2017-07-11 DIAGNOSIS — I10 Essential (primary) hypertension: Secondary | ICD-10-CM | POA: Diagnosis not present

## 2017-07-11 DIAGNOSIS — G309 Alzheimer's disease, unspecified: Secondary | ICD-10-CM | POA: Diagnosis not present

## 2017-07-11 DIAGNOSIS — E785 Hyperlipidemia, unspecified: Secondary | ICD-10-CM | POA: Diagnosis not present

## 2017-07-11 DIAGNOSIS — F329 Major depressive disorder, single episode, unspecified: Secondary | ICD-10-CM | POA: Diagnosis not present

## 2017-07-11 DIAGNOSIS — M1990 Unspecified osteoarthritis, unspecified site: Secondary | ICD-10-CM | POA: Diagnosis not present

## 2017-07-12 DIAGNOSIS — G309 Alzheimer's disease, unspecified: Secondary | ICD-10-CM | POA: Diagnosis not present

## 2017-07-12 DIAGNOSIS — E039 Hypothyroidism, unspecified: Secondary | ICD-10-CM | POA: Diagnosis not present

## 2017-07-12 DIAGNOSIS — F329 Major depressive disorder, single episode, unspecified: Secondary | ICD-10-CM | POA: Diagnosis not present

## 2017-07-12 DIAGNOSIS — E785 Hyperlipidemia, unspecified: Secondary | ICD-10-CM | POA: Diagnosis not present

## 2017-07-12 DIAGNOSIS — I10 Essential (primary) hypertension: Secondary | ICD-10-CM | POA: Diagnosis not present

## 2017-07-12 DIAGNOSIS — M1990 Unspecified osteoarthritis, unspecified site: Secondary | ICD-10-CM | POA: Diagnosis not present

## 2017-07-16 DIAGNOSIS — E039 Hypothyroidism, unspecified: Secondary | ICD-10-CM | POA: Diagnosis not present

## 2017-07-16 DIAGNOSIS — E785 Hyperlipidemia, unspecified: Secondary | ICD-10-CM | POA: Diagnosis not present

## 2017-07-16 DIAGNOSIS — G309 Alzheimer's disease, unspecified: Secondary | ICD-10-CM | POA: Diagnosis not present

## 2017-07-16 DIAGNOSIS — M1990 Unspecified osteoarthritis, unspecified site: Secondary | ICD-10-CM | POA: Diagnosis not present

## 2017-07-16 DIAGNOSIS — I10 Essential (primary) hypertension: Secondary | ICD-10-CM | POA: Diagnosis not present

## 2017-07-16 DIAGNOSIS — F329 Major depressive disorder, single episode, unspecified: Secondary | ICD-10-CM | POA: Diagnosis not present

## 2017-07-17 DIAGNOSIS — I10 Essential (primary) hypertension: Secondary | ICD-10-CM | POA: Diagnosis not present

## 2017-07-17 DIAGNOSIS — E039 Hypothyroidism, unspecified: Secondary | ICD-10-CM | POA: Diagnosis not present

## 2017-07-17 DIAGNOSIS — F329 Major depressive disorder, single episode, unspecified: Secondary | ICD-10-CM | POA: Diagnosis not present

## 2017-07-17 DIAGNOSIS — E785 Hyperlipidemia, unspecified: Secondary | ICD-10-CM | POA: Diagnosis not present

## 2017-07-17 DIAGNOSIS — M1990 Unspecified osteoarthritis, unspecified site: Secondary | ICD-10-CM | POA: Diagnosis not present

## 2017-07-17 DIAGNOSIS — G309 Alzheimer's disease, unspecified: Secondary | ICD-10-CM | POA: Diagnosis not present

## 2017-07-18 DIAGNOSIS — E785 Hyperlipidemia, unspecified: Secondary | ICD-10-CM | POA: Diagnosis not present

## 2017-07-18 DIAGNOSIS — E039 Hypothyroidism, unspecified: Secondary | ICD-10-CM | POA: Diagnosis not present

## 2017-07-18 DIAGNOSIS — M1990 Unspecified osteoarthritis, unspecified site: Secondary | ICD-10-CM | POA: Diagnosis not present

## 2017-07-18 DIAGNOSIS — F329 Major depressive disorder, single episode, unspecified: Secondary | ICD-10-CM | POA: Diagnosis not present

## 2017-07-18 DIAGNOSIS — I10 Essential (primary) hypertension: Secondary | ICD-10-CM | POA: Diagnosis not present

## 2017-07-18 DIAGNOSIS — G309 Alzheimer's disease, unspecified: Secondary | ICD-10-CM | POA: Diagnosis not present

## 2017-07-19 DIAGNOSIS — M1990 Unspecified osteoarthritis, unspecified site: Secondary | ICD-10-CM | POA: Diagnosis not present

## 2017-07-19 DIAGNOSIS — I10 Essential (primary) hypertension: Secondary | ICD-10-CM | POA: Diagnosis not present

## 2017-07-19 DIAGNOSIS — G309 Alzheimer's disease, unspecified: Secondary | ICD-10-CM | POA: Diagnosis not present

## 2017-07-19 DIAGNOSIS — E785 Hyperlipidemia, unspecified: Secondary | ICD-10-CM | POA: Diagnosis not present

## 2017-07-19 DIAGNOSIS — E039 Hypothyroidism, unspecified: Secondary | ICD-10-CM | POA: Diagnosis not present

## 2017-07-19 DIAGNOSIS — F329 Major depressive disorder, single episode, unspecified: Secondary | ICD-10-CM | POA: Diagnosis not present

## 2017-07-22 DIAGNOSIS — G309 Alzheimer's disease, unspecified: Secondary | ICD-10-CM | POA: Diagnosis not present

## 2017-07-22 DIAGNOSIS — E039 Hypothyroidism, unspecified: Secondary | ICD-10-CM | POA: Diagnosis not present

## 2017-07-22 DIAGNOSIS — I10 Essential (primary) hypertension: Secondary | ICD-10-CM | POA: Diagnosis not present

## 2017-07-22 DIAGNOSIS — E785 Hyperlipidemia, unspecified: Secondary | ICD-10-CM | POA: Diagnosis not present

## 2017-07-22 DIAGNOSIS — M1990 Unspecified osteoarthritis, unspecified site: Secondary | ICD-10-CM | POA: Diagnosis not present

## 2017-07-22 DIAGNOSIS — F329 Major depressive disorder, single episode, unspecified: Secondary | ICD-10-CM | POA: Diagnosis not present

## 2017-07-23 DIAGNOSIS — I10 Essential (primary) hypertension: Secondary | ICD-10-CM | POA: Diagnosis not present

## 2017-07-23 DIAGNOSIS — M1990 Unspecified osteoarthritis, unspecified site: Secondary | ICD-10-CM | POA: Diagnosis not present

## 2017-07-23 DIAGNOSIS — F329 Major depressive disorder, single episode, unspecified: Secondary | ICD-10-CM | POA: Diagnosis not present

## 2017-07-23 DIAGNOSIS — E039 Hypothyroidism, unspecified: Secondary | ICD-10-CM | POA: Diagnosis not present

## 2017-07-23 DIAGNOSIS — G309 Alzheimer's disease, unspecified: Secondary | ICD-10-CM | POA: Diagnosis not present

## 2017-07-23 DIAGNOSIS — E785 Hyperlipidemia, unspecified: Secondary | ICD-10-CM | POA: Diagnosis not present

## 2017-07-24 DIAGNOSIS — E785 Hyperlipidemia, unspecified: Secondary | ICD-10-CM | POA: Diagnosis not present

## 2017-07-24 DIAGNOSIS — I10 Essential (primary) hypertension: Secondary | ICD-10-CM | POA: Diagnosis not present

## 2017-07-24 DIAGNOSIS — M1990 Unspecified osteoarthritis, unspecified site: Secondary | ICD-10-CM | POA: Diagnosis not present

## 2017-07-24 DIAGNOSIS — E039 Hypothyroidism, unspecified: Secondary | ICD-10-CM | POA: Diagnosis not present

## 2017-07-24 DIAGNOSIS — F329 Major depressive disorder, single episode, unspecified: Secondary | ICD-10-CM | POA: Diagnosis not present

## 2017-07-24 DIAGNOSIS — G309 Alzheimer's disease, unspecified: Secondary | ICD-10-CM | POA: Diagnosis not present

## 2017-07-25 DIAGNOSIS — F329 Major depressive disorder, single episode, unspecified: Secondary | ICD-10-CM | POA: Diagnosis not present

## 2017-07-25 DIAGNOSIS — I10 Essential (primary) hypertension: Secondary | ICD-10-CM | POA: Diagnosis not present

## 2017-07-25 DIAGNOSIS — E039 Hypothyroidism, unspecified: Secondary | ICD-10-CM | POA: Diagnosis not present

## 2017-07-25 DIAGNOSIS — G309 Alzheimer's disease, unspecified: Secondary | ICD-10-CM | POA: Diagnosis not present

## 2017-07-25 DIAGNOSIS — E785 Hyperlipidemia, unspecified: Secondary | ICD-10-CM | POA: Diagnosis not present

## 2017-07-25 DIAGNOSIS — M1990 Unspecified osteoarthritis, unspecified site: Secondary | ICD-10-CM | POA: Diagnosis not present

## 2017-07-29 DIAGNOSIS — E46 Unspecified protein-calorie malnutrition: Secondary | ICD-10-CM | POA: Diagnosis not present

## 2017-07-29 DIAGNOSIS — I639 Cerebral infarction, unspecified: Secondary | ICD-10-CM | POA: Diagnosis not present

## 2017-07-29 DIAGNOSIS — E039 Hypothyroidism, unspecified: Secondary | ICD-10-CM | POA: Diagnosis not present

## 2017-07-29 DIAGNOSIS — G250 Essential tremor: Secondary | ICD-10-CM | POA: Diagnosis not present

## 2017-07-29 DIAGNOSIS — F329 Major depressive disorder, single episode, unspecified: Secondary | ICD-10-CM | POA: Diagnosis not present

## 2017-07-29 DIAGNOSIS — E785 Hyperlipidemia, unspecified: Secondary | ICD-10-CM | POA: Diagnosis not present

## 2017-07-29 DIAGNOSIS — G309 Alzheimer's disease, unspecified: Secondary | ICD-10-CM | POA: Diagnosis not present

## 2017-07-29 DIAGNOSIS — M1990 Unspecified osteoarthritis, unspecified site: Secondary | ICD-10-CM | POA: Diagnosis not present

## 2017-07-29 DIAGNOSIS — I10 Essential (primary) hypertension: Secondary | ICD-10-CM | POA: Diagnosis not present

## 2017-07-29 DIAGNOSIS — R531 Weakness: Secondary | ICD-10-CM | POA: Diagnosis not present

## 2017-07-30 DIAGNOSIS — E785 Hyperlipidemia, unspecified: Secondary | ICD-10-CM | POA: Diagnosis not present

## 2017-07-30 DIAGNOSIS — I10 Essential (primary) hypertension: Secondary | ICD-10-CM | POA: Diagnosis not present

## 2017-07-30 DIAGNOSIS — G309 Alzheimer's disease, unspecified: Secondary | ICD-10-CM | POA: Diagnosis not present

## 2017-07-30 DIAGNOSIS — F329 Major depressive disorder, single episode, unspecified: Secondary | ICD-10-CM | POA: Diagnosis not present

## 2017-07-30 DIAGNOSIS — E039 Hypothyroidism, unspecified: Secondary | ICD-10-CM | POA: Diagnosis not present

## 2017-07-30 DIAGNOSIS — M1990 Unspecified osteoarthritis, unspecified site: Secondary | ICD-10-CM | POA: Diagnosis not present

## 2017-07-31 DIAGNOSIS — I10 Essential (primary) hypertension: Secondary | ICD-10-CM | POA: Diagnosis not present

## 2017-07-31 DIAGNOSIS — E785 Hyperlipidemia, unspecified: Secondary | ICD-10-CM | POA: Diagnosis not present

## 2017-07-31 DIAGNOSIS — M1990 Unspecified osteoarthritis, unspecified site: Secondary | ICD-10-CM | POA: Diagnosis not present

## 2017-07-31 DIAGNOSIS — F329 Major depressive disorder, single episode, unspecified: Secondary | ICD-10-CM | POA: Diagnosis not present

## 2017-07-31 DIAGNOSIS — E039 Hypothyroidism, unspecified: Secondary | ICD-10-CM | POA: Diagnosis not present

## 2017-07-31 DIAGNOSIS — G309 Alzheimer's disease, unspecified: Secondary | ICD-10-CM | POA: Diagnosis not present

## 2017-08-01 DIAGNOSIS — M1990 Unspecified osteoarthritis, unspecified site: Secondary | ICD-10-CM | POA: Diagnosis not present

## 2017-08-01 DIAGNOSIS — E785 Hyperlipidemia, unspecified: Secondary | ICD-10-CM | POA: Diagnosis not present

## 2017-08-01 DIAGNOSIS — E039 Hypothyroidism, unspecified: Secondary | ICD-10-CM | POA: Diagnosis not present

## 2017-08-01 DIAGNOSIS — F329 Major depressive disorder, single episode, unspecified: Secondary | ICD-10-CM | POA: Diagnosis not present

## 2017-08-01 DIAGNOSIS — I10 Essential (primary) hypertension: Secondary | ICD-10-CM | POA: Diagnosis not present

## 2017-08-01 DIAGNOSIS — G309 Alzheimer's disease, unspecified: Secondary | ICD-10-CM | POA: Diagnosis not present

## 2017-08-02 DIAGNOSIS — I10 Essential (primary) hypertension: Secondary | ICD-10-CM | POA: Diagnosis not present

## 2017-08-02 DIAGNOSIS — G309 Alzheimer's disease, unspecified: Secondary | ICD-10-CM | POA: Diagnosis not present

## 2017-08-02 DIAGNOSIS — E039 Hypothyroidism, unspecified: Secondary | ICD-10-CM | POA: Diagnosis not present

## 2017-08-02 DIAGNOSIS — F329 Major depressive disorder, single episode, unspecified: Secondary | ICD-10-CM | POA: Diagnosis not present

## 2017-08-02 DIAGNOSIS — E785 Hyperlipidemia, unspecified: Secondary | ICD-10-CM | POA: Diagnosis not present

## 2017-08-02 DIAGNOSIS — M1990 Unspecified osteoarthritis, unspecified site: Secondary | ICD-10-CM | POA: Diagnosis not present

## 2017-08-05 DIAGNOSIS — E785 Hyperlipidemia, unspecified: Secondary | ICD-10-CM | POA: Diagnosis not present

## 2017-08-05 DIAGNOSIS — I10 Essential (primary) hypertension: Secondary | ICD-10-CM | POA: Diagnosis not present

## 2017-08-05 DIAGNOSIS — G309 Alzheimer's disease, unspecified: Secondary | ICD-10-CM | POA: Diagnosis not present

## 2017-08-05 DIAGNOSIS — F329 Major depressive disorder, single episode, unspecified: Secondary | ICD-10-CM | POA: Diagnosis not present

## 2017-08-05 DIAGNOSIS — E039 Hypothyroidism, unspecified: Secondary | ICD-10-CM | POA: Diagnosis not present

## 2017-08-05 DIAGNOSIS — M1990 Unspecified osteoarthritis, unspecified site: Secondary | ICD-10-CM | POA: Diagnosis not present

## 2017-08-06 DIAGNOSIS — I10 Essential (primary) hypertension: Secondary | ICD-10-CM | POA: Diagnosis not present

## 2017-08-06 DIAGNOSIS — M1990 Unspecified osteoarthritis, unspecified site: Secondary | ICD-10-CM | POA: Diagnosis not present

## 2017-08-06 DIAGNOSIS — E785 Hyperlipidemia, unspecified: Secondary | ICD-10-CM | POA: Diagnosis not present

## 2017-08-06 DIAGNOSIS — F329 Major depressive disorder, single episode, unspecified: Secondary | ICD-10-CM | POA: Diagnosis not present

## 2017-08-06 DIAGNOSIS — G309 Alzheimer's disease, unspecified: Secondary | ICD-10-CM | POA: Diagnosis not present

## 2017-08-06 DIAGNOSIS — E039 Hypothyroidism, unspecified: Secondary | ICD-10-CM | POA: Diagnosis not present

## 2017-08-07 DIAGNOSIS — I10 Essential (primary) hypertension: Secondary | ICD-10-CM | POA: Diagnosis not present

## 2017-08-07 DIAGNOSIS — F329 Major depressive disorder, single episode, unspecified: Secondary | ICD-10-CM | POA: Diagnosis not present

## 2017-08-07 DIAGNOSIS — G309 Alzheimer's disease, unspecified: Secondary | ICD-10-CM | POA: Diagnosis not present

## 2017-08-07 DIAGNOSIS — E785 Hyperlipidemia, unspecified: Secondary | ICD-10-CM | POA: Diagnosis not present

## 2017-08-07 DIAGNOSIS — E039 Hypothyroidism, unspecified: Secondary | ICD-10-CM | POA: Diagnosis not present

## 2017-08-07 DIAGNOSIS — M1990 Unspecified osteoarthritis, unspecified site: Secondary | ICD-10-CM | POA: Diagnosis not present

## 2017-08-08 DIAGNOSIS — E039 Hypothyroidism, unspecified: Secondary | ICD-10-CM | POA: Diagnosis not present

## 2017-08-08 DIAGNOSIS — E785 Hyperlipidemia, unspecified: Secondary | ICD-10-CM | POA: Diagnosis not present

## 2017-08-08 DIAGNOSIS — F329 Major depressive disorder, single episode, unspecified: Secondary | ICD-10-CM | POA: Diagnosis not present

## 2017-08-08 DIAGNOSIS — G309 Alzheimer's disease, unspecified: Secondary | ICD-10-CM | POA: Diagnosis not present

## 2017-08-08 DIAGNOSIS — I10 Essential (primary) hypertension: Secondary | ICD-10-CM | POA: Diagnosis not present

## 2017-08-08 DIAGNOSIS — M1990 Unspecified osteoarthritis, unspecified site: Secondary | ICD-10-CM | POA: Diagnosis not present

## 2017-08-12 DIAGNOSIS — I10 Essential (primary) hypertension: Secondary | ICD-10-CM | POA: Diagnosis not present

## 2017-08-12 DIAGNOSIS — M1990 Unspecified osteoarthritis, unspecified site: Secondary | ICD-10-CM | POA: Diagnosis not present

## 2017-08-12 DIAGNOSIS — E039 Hypothyroidism, unspecified: Secondary | ICD-10-CM | POA: Diagnosis not present

## 2017-08-12 DIAGNOSIS — F329 Major depressive disorder, single episode, unspecified: Secondary | ICD-10-CM | POA: Diagnosis not present

## 2017-08-12 DIAGNOSIS — G309 Alzheimer's disease, unspecified: Secondary | ICD-10-CM | POA: Diagnosis not present

## 2017-08-12 DIAGNOSIS — E785 Hyperlipidemia, unspecified: Secondary | ICD-10-CM | POA: Diagnosis not present

## 2017-08-14 DIAGNOSIS — F329 Major depressive disorder, single episode, unspecified: Secondary | ICD-10-CM | POA: Diagnosis not present

## 2017-08-14 DIAGNOSIS — E039 Hypothyroidism, unspecified: Secondary | ICD-10-CM | POA: Diagnosis not present

## 2017-08-14 DIAGNOSIS — I10 Essential (primary) hypertension: Secondary | ICD-10-CM | POA: Diagnosis not present

## 2017-08-14 DIAGNOSIS — E785 Hyperlipidemia, unspecified: Secondary | ICD-10-CM | POA: Diagnosis not present

## 2017-08-14 DIAGNOSIS — G309 Alzheimer's disease, unspecified: Secondary | ICD-10-CM | POA: Diagnosis not present

## 2017-08-14 DIAGNOSIS — M1990 Unspecified osteoarthritis, unspecified site: Secondary | ICD-10-CM | POA: Diagnosis not present

## 2017-08-15 DIAGNOSIS — E785 Hyperlipidemia, unspecified: Secondary | ICD-10-CM | POA: Diagnosis not present

## 2017-08-15 DIAGNOSIS — F329 Major depressive disorder, single episode, unspecified: Secondary | ICD-10-CM | POA: Diagnosis not present

## 2017-08-15 DIAGNOSIS — M1990 Unspecified osteoarthritis, unspecified site: Secondary | ICD-10-CM | POA: Diagnosis not present

## 2017-08-15 DIAGNOSIS — G309 Alzheimer's disease, unspecified: Secondary | ICD-10-CM | POA: Diagnosis not present

## 2017-08-15 DIAGNOSIS — I10 Essential (primary) hypertension: Secondary | ICD-10-CM | POA: Diagnosis not present

## 2017-08-15 DIAGNOSIS — E039 Hypothyroidism, unspecified: Secondary | ICD-10-CM | POA: Diagnosis not present

## 2017-08-16 DIAGNOSIS — E039 Hypothyroidism, unspecified: Secondary | ICD-10-CM | POA: Diagnosis not present

## 2017-08-16 DIAGNOSIS — I10 Essential (primary) hypertension: Secondary | ICD-10-CM | POA: Diagnosis not present

## 2017-08-16 DIAGNOSIS — M1990 Unspecified osteoarthritis, unspecified site: Secondary | ICD-10-CM | POA: Diagnosis not present

## 2017-08-16 DIAGNOSIS — E785 Hyperlipidemia, unspecified: Secondary | ICD-10-CM | POA: Diagnosis not present

## 2017-08-16 DIAGNOSIS — G309 Alzheimer's disease, unspecified: Secondary | ICD-10-CM | POA: Diagnosis not present

## 2017-08-16 DIAGNOSIS — F329 Major depressive disorder, single episode, unspecified: Secondary | ICD-10-CM | POA: Diagnosis not present

## 2017-08-19 DIAGNOSIS — E785 Hyperlipidemia, unspecified: Secondary | ICD-10-CM | POA: Diagnosis not present

## 2017-08-19 DIAGNOSIS — E039 Hypothyroidism, unspecified: Secondary | ICD-10-CM | POA: Diagnosis not present

## 2017-08-19 DIAGNOSIS — G309 Alzheimer's disease, unspecified: Secondary | ICD-10-CM | POA: Diagnosis not present

## 2017-08-19 DIAGNOSIS — I10 Essential (primary) hypertension: Secondary | ICD-10-CM | POA: Diagnosis not present

## 2017-08-19 DIAGNOSIS — F329 Major depressive disorder, single episode, unspecified: Secondary | ICD-10-CM | POA: Diagnosis not present

## 2017-08-19 DIAGNOSIS — M1990 Unspecified osteoarthritis, unspecified site: Secondary | ICD-10-CM | POA: Diagnosis not present

## 2017-08-20 DIAGNOSIS — F329 Major depressive disorder, single episode, unspecified: Secondary | ICD-10-CM | POA: Diagnosis not present

## 2017-08-20 DIAGNOSIS — M1990 Unspecified osteoarthritis, unspecified site: Secondary | ICD-10-CM | POA: Diagnosis not present

## 2017-08-20 DIAGNOSIS — E785 Hyperlipidemia, unspecified: Secondary | ICD-10-CM | POA: Diagnosis not present

## 2017-08-20 DIAGNOSIS — G309 Alzheimer's disease, unspecified: Secondary | ICD-10-CM | POA: Diagnosis not present

## 2017-08-20 DIAGNOSIS — E039 Hypothyroidism, unspecified: Secondary | ICD-10-CM | POA: Diagnosis not present

## 2017-08-20 DIAGNOSIS — I10 Essential (primary) hypertension: Secondary | ICD-10-CM | POA: Diagnosis not present

## 2017-08-21 DIAGNOSIS — M1990 Unspecified osteoarthritis, unspecified site: Secondary | ICD-10-CM | POA: Diagnosis not present

## 2017-08-21 DIAGNOSIS — E785 Hyperlipidemia, unspecified: Secondary | ICD-10-CM | POA: Diagnosis not present

## 2017-08-21 DIAGNOSIS — I10 Essential (primary) hypertension: Secondary | ICD-10-CM | POA: Diagnosis not present

## 2017-08-21 DIAGNOSIS — G309 Alzheimer's disease, unspecified: Secondary | ICD-10-CM | POA: Diagnosis not present

## 2017-08-21 DIAGNOSIS — F329 Major depressive disorder, single episode, unspecified: Secondary | ICD-10-CM | POA: Diagnosis not present

## 2017-08-21 DIAGNOSIS — E039 Hypothyroidism, unspecified: Secondary | ICD-10-CM | POA: Diagnosis not present

## 2017-08-22 DIAGNOSIS — F329 Major depressive disorder, single episode, unspecified: Secondary | ICD-10-CM | POA: Diagnosis not present

## 2017-08-22 DIAGNOSIS — M1990 Unspecified osteoarthritis, unspecified site: Secondary | ICD-10-CM | POA: Diagnosis not present

## 2017-08-22 DIAGNOSIS — I10 Essential (primary) hypertension: Secondary | ICD-10-CM | POA: Diagnosis not present

## 2017-08-22 DIAGNOSIS — E039 Hypothyroidism, unspecified: Secondary | ICD-10-CM | POA: Diagnosis not present

## 2017-08-22 DIAGNOSIS — G309 Alzheimer's disease, unspecified: Secondary | ICD-10-CM | POA: Diagnosis not present

## 2017-08-22 DIAGNOSIS — E785 Hyperlipidemia, unspecified: Secondary | ICD-10-CM | POA: Diagnosis not present

## 2017-08-27 DIAGNOSIS — F329 Major depressive disorder, single episode, unspecified: Secondary | ICD-10-CM | POA: Diagnosis not present

## 2017-08-27 DIAGNOSIS — E785 Hyperlipidemia, unspecified: Secondary | ICD-10-CM | POA: Diagnosis not present

## 2017-08-27 DIAGNOSIS — I10 Essential (primary) hypertension: Secondary | ICD-10-CM | POA: Diagnosis not present

## 2017-08-27 DIAGNOSIS — M1990 Unspecified osteoarthritis, unspecified site: Secondary | ICD-10-CM | POA: Diagnosis not present

## 2017-08-27 DIAGNOSIS — G309 Alzheimer's disease, unspecified: Secondary | ICD-10-CM | POA: Diagnosis not present

## 2017-08-27 DIAGNOSIS — E039 Hypothyroidism, unspecified: Secondary | ICD-10-CM | POA: Diagnosis not present

## 2017-08-28 DIAGNOSIS — G309 Alzheimer's disease, unspecified: Secondary | ICD-10-CM | POA: Diagnosis not present

## 2017-08-28 DIAGNOSIS — M1990 Unspecified osteoarthritis, unspecified site: Secondary | ICD-10-CM | POA: Diagnosis not present

## 2017-08-28 DIAGNOSIS — E039 Hypothyroidism, unspecified: Secondary | ICD-10-CM | POA: Diagnosis not present

## 2017-08-28 DIAGNOSIS — I10 Essential (primary) hypertension: Secondary | ICD-10-CM | POA: Diagnosis not present

## 2017-08-28 DIAGNOSIS — E785 Hyperlipidemia, unspecified: Secondary | ICD-10-CM | POA: Diagnosis not present

## 2017-08-28 DIAGNOSIS — F329 Major depressive disorder, single episode, unspecified: Secondary | ICD-10-CM | POA: Diagnosis not present

## 2017-08-29 DIAGNOSIS — I639 Cerebral infarction, unspecified: Secondary | ICD-10-CM | POA: Diagnosis not present

## 2017-08-29 DIAGNOSIS — R531 Weakness: Secondary | ICD-10-CM | POA: Diagnosis not present

## 2017-08-29 DIAGNOSIS — M1990 Unspecified osteoarthritis, unspecified site: Secondary | ICD-10-CM | POA: Diagnosis not present

## 2017-08-29 DIAGNOSIS — E039 Hypothyroidism, unspecified: Secondary | ICD-10-CM | POA: Diagnosis not present

## 2017-08-29 DIAGNOSIS — G250 Essential tremor: Secondary | ICD-10-CM | POA: Diagnosis not present

## 2017-08-29 DIAGNOSIS — E785 Hyperlipidemia, unspecified: Secondary | ICD-10-CM | POA: Diagnosis not present

## 2017-08-29 DIAGNOSIS — I10 Essential (primary) hypertension: Secondary | ICD-10-CM | POA: Diagnosis not present

## 2017-08-29 DIAGNOSIS — F329 Major depressive disorder, single episode, unspecified: Secondary | ICD-10-CM | POA: Diagnosis not present

## 2017-08-29 DIAGNOSIS — G309 Alzheimer's disease, unspecified: Secondary | ICD-10-CM | POA: Diagnosis not present

## 2017-08-29 DIAGNOSIS — E46 Unspecified protein-calorie malnutrition: Secondary | ICD-10-CM | POA: Diagnosis not present

## 2017-08-30 DIAGNOSIS — M1990 Unspecified osteoarthritis, unspecified site: Secondary | ICD-10-CM | POA: Diagnosis not present

## 2017-08-30 DIAGNOSIS — I10 Essential (primary) hypertension: Secondary | ICD-10-CM | POA: Diagnosis not present

## 2017-08-30 DIAGNOSIS — E039 Hypothyroidism, unspecified: Secondary | ICD-10-CM | POA: Diagnosis not present

## 2017-08-30 DIAGNOSIS — E785 Hyperlipidemia, unspecified: Secondary | ICD-10-CM | POA: Diagnosis not present

## 2017-08-30 DIAGNOSIS — G309 Alzheimer's disease, unspecified: Secondary | ICD-10-CM | POA: Diagnosis not present

## 2017-08-30 DIAGNOSIS — F329 Major depressive disorder, single episode, unspecified: Secondary | ICD-10-CM | POA: Diagnosis not present

## 2017-09-02 DIAGNOSIS — E785 Hyperlipidemia, unspecified: Secondary | ICD-10-CM | POA: Diagnosis not present

## 2017-09-02 DIAGNOSIS — I10 Essential (primary) hypertension: Secondary | ICD-10-CM | POA: Diagnosis not present

## 2017-09-02 DIAGNOSIS — M1990 Unspecified osteoarthritis, unspecified site: Secondary | ICD-10-CM | POA: Diagnosis not present

## 2017-09-02 DIAGNOSIS — G309 Alzheimer's disease, unspecified: Secondary | ICD-10-CM | POA: Diagnosis not present

## 2017-09-02 DIAGNOSIS — E039 Hypothyroidism, unspecified: Secondary | ICD-10-CM | POA: Diagnosis not present

## 2017-09-02 DIAGNOSIS — F329 Major depressive disorder, single episode, unspecified: Secondary | ICD-10-CM | POA: Diagnosis not present

## 2017-09-03 DIAGNOSIS — E785 Hyperlipidemia, unspecified: Secondary | ICD-10-CM | POA: Diagnosis not present

## 2017-09-03 DIAGNOSIS — F329 Major depressive disorder, single episode, unspecified: Secondary | ICD-10-CM | POA: Diagnosis not present

## 2017-09-03 DIAGNOSIS — G309 Alzheimer's disease, unspecified: Secondary | ICD-10-CM | POA: Diagnosis not present

## 2017-09-03 DIAGNOSIS — E039 Hypothyroidism, unspecified: Secondary | ICD-10-CM | POA: Diagnosis not present

## 2017-09-03 DIAGNOSIS — M1990 Unspecified osteoarthritis, unspecified site: Secondary | ICD-10-CM | POA: Diagnosis not present

## 2017-09-03 DIAGNOSIS — I10 Essential (primary) hypertension: Secondary | ICD-10-CM | POA: Diagnosis not present

## 2017-09-04 DIAGNOSIS — E785 Hyperlipidemia, unspecified: Secondary | ICD-10-CM | POA: Diagnosis not present

## 2017-09-04 DIAGNOSIS — I10 Essential (primary) hypertension: Secondary | ICD-10-CM | POA: Diagnosis not present

## 2017-09-04 DIAGNOSIS — M1990 Unspecified osteoarthritis, unspecified site: Secondary | ICD-10-CM | POA: Diagnosis not present

## 2017-09-04 DIAGNOSIS — G309 Alzheimer's disease, unspecified: Secondary | ICD-10-CM | POA: Diagnosis not present

## 2017-09-04 DIAGNOSIS — E039 Hypothyroidism, unspecified: Secondary | ICD-10-CM | POA: Diagnosis not present

## 2017-09-04 DIAGNOSIS — F329 Major depressive disorder, single episode, unspecified: Secondary | ICD-10-CM | POA: Diagnosis not present

## 2017-09-05 DIAGNOSIS — E785 Hyperlipidemia, unspecified: Secondary | ICD-10-CM | POA: Diagnosis not present

## 2017-09-05 DIAGNOSIS — I10 Essential (primary) hypertension: Secondary | ICD-10-CM | POA: Diagnosis not present

## 2017-09-05 DIAGNOSIS — G309 Alzheimer's disease, unspecified: Secondary | ICD-10-CM | POA: Diagnosis not present

## 2017-09-05 DIAGNOSIS — F329 Major depressive disorder, single episode, unspecified: Secondary | ICD-10-CM | POA: Diagnosis not present

## 2017-09-05 DIAGNOSIS — E039 Hypothyroidism, unspecified: Secondary | ICD-10-CM | POA: Diagnosis not present

## 2017-09-05 DIAGNOSIS — M1990 Unspecified osteoarthritis, unspecified site: Secondary | ICD-10-CM | POA: Diagnosis not present

## 2017-09-06 DIAGNOSIS — M1990 Unspecified osteoarthritis, unspecified site: Secondary | ICD-10-CM | POA: Diagnosis not present

## 2017-09-06 DIAGNOSIS — E785 Hyperlipidemia, unspecified: Secondary | ICD-10-CM | POA: Diagnosis not present

## 2017-09-06 DIAGNOSIS — I10 Essential (primary) hypertension: Secondary | ICD-10-CM | POA: Diagnosis not present

## 2017-09-06 DIAGNOSIS — F329 Major depressive disorder, single episode, unspecified: Secondary | ICD-10-CM | POA: Diagnosis not present

## 2017-09-06 DIAGNOSIS — E039 Hypothyroidism, unspecified: Secondary | ICD-10-CM | POA: Diagnosis not present

## 2017-09-06 DIAGNOSIS — G309 Alzheimer's disease, unspecified: Secondary | ICD-10-CM | POA: Diagnosis not present

## 2017-09-10 DIAGNOSIS — F329 Major depressive disorder, single episode, unspecified: Secondary | ICD-10-CM | POA: Diagnosis not present

## 2017-09-10 DIAGNOSIS — I10 Essential (primary) hypertension: Secondary | ICD-10-CM | POA: Diagnosis not present

## 2017-09-10 DIAGNOSIS — E785 Hyperlipidemia, unspecified: Secondary | ICD-10-CM | POA: Diagnosis not present

## 2017-09-10 DIAGNOSIS — G309 Alzheimer's disease, unspecified: Secondary | ICD-10-CM | POA: Diagnosis not present

## 2017-09-10 DIAGNOSIS — M1990 Unspecified osteoarthritis, unspecified site: Secondary | ICD-10-CM | POA: Diagnosis not present

## 2017-09-10 DIAGNOSIS — E039 Hypothyroidism, unspecified: Secondary | ICD-10-CM | POA: Diagnosis not present

## 2017-09-11 DIAGNOSIS — I10 Essential (primary) hypertension: Secondary | ICD-10-CM | POA: Diagnosis not present

## 2017-09-11 DIAGNOSIS — G309 Alzheimer's disease, unspecified: Secondary | ICD-10-CM | POA: Diagnosis not present

## 2017-09-11 DIAGNOSIS — F329 Major depressive disorder, single episode, unspecified: Secondary | ICD-10-CM | POA: Diagnosis not present

## 2017-09-11 DIAGNOSIS — E785 Hyperlipidemia, unspecified: Secondary | ICD-10-CM | POA: Diagnosis not present

## 2017-09-11 DIAGNOSIS — M1990 Unspecified osteoarthritis, unspecified site: Secondary | ICD-10-CM | POA: Diagnosis not present

## 2017-09-11 DIAGNOSIS — E039 Hypothyroidism, unspecified: Secondary | ICD-10-CM | POA: Diagnosis not present

## 2017-09-12 DIAGNOSIS — G309 Alzheimer's disease, unspecified: Secondary | ICD-10-CM | POA: Diagnosis not present

## 2017-09-12 DIAGNOSIS — M1990 Unspecified osteoarthritis, unspecified site: Secondary | ICD-10-CM | POA: Diagnosis not present

## 2017-09-12 DIAGNOSIS — F329 Major depressive disorder, single episode, unspecified: Secondary | ICD-10-CM | POA: Diagnosis not present

## 2017-09-12 DIAGNOSIS — E039 Hypothyroidism, unspecified: Secondary | ICD-10-CM | POA: Diagnosis not present

## 2017-09-12 DIAGNOSIS — I10 Essential (primary) hypertension: Secondary | ICD-10-CM | POA: Diagnosis not present

## 2017-09-12 DIAGNOSIS — E785 Hyperlipidemia, unspecified: Secondary | ICD-10-CM | POA: Diagnosis not present

## 2017-09-13 DIAGNOSIS — M1990 Unspecified osteoarthritis, unspecified site: Secondary | ICD-10-CM | POA: Diagnosis not present

## 2017-09-13 DIAGNOSIS — I10 Essential (primary) hypertension: Secondary | ICD-10-CM | POA: Diagnosis not present

## 2017-09-13 DIAGNOSIS — G309 Alzheimer's disease, unspecified: Secondary | ICD-10-CM | POA: Diagnosis not present

## 2017-09-13 DIAGNOSIS — E039 Hypothyroidism, unspecified: Secondary | ICD-10-CM | POA: Diagnosis not present

## 2017-09-13 DIAGNOSIS — F329 Major depressive disorder, single episode, unspecified: Secondary | ICD-10-CM | POA: Diagnosis not present

## 2017-09-13 DIAGNOSIS — E785 Hyperlipidemia, unspecified: Secondary | ICD-10-CM | POA: Diagnosis not present

## 2017-09-16 DIAGNOSIS — E785 Hyperlipidemia, unspecified: Secondary | ICD-10-CM | POA: Diagnosis not present

## 2017-09-16 DIAGNOSIS — G309 Alzheimer's disease, unspecified: Secondary | ICD-10-CM | POA: Diagnosis not present

## 2017-09-16 DIAGNOSIS — F329 Major depressive disorder, single episode, unspecified: Secondary | ICD-10-CM | POA: Diagnosis not present

## 2017-09-16 DIAGNOSIS — M1990 Unspecified osteoarthritis, unspecified site: Secondary | ICD-10-CM | POA: Diagnosis not present

## 2017-09-16 DIAGNOSIS — I10 Essential (primary) hypertension: Secondary | ICD-10-CM | POA: Diagnosis not present

## 2017-09-16 DIAGNOSIS — E039 Hypothyroidism, unspecified: Secondary | ICD-10-CM | POA: Diagnosis not present

## 2017-09-17 DIAGNOSIS — M1990 Unspecified osteoarthritis, unspecified site: Secondary | ICD-10-CM | POA: Diagnosis not present

## 2017-09-17 DIAGNOSIS — F329 Major depressive disorder, single episode, unspecified: Secondary | ICD-10-CM | POA: Diagnosis not present

## 2017-09-17 DIAGNOSIS — E785 Hyperlipidemia, unspecified: Secondary | ICD-10-CM | POA: Diagnosis not present

## 2017-09-17 DIAGNOSIS — G309 Alzheimer's disease, unspecified: Secondary | ICD-10-CM | POA: Diagnosis not present

## 2017-09-17 DIAGNOSIS — E039 Hypothyroidism, unspecified: Secondary | ICD-10-CM | POA: Diagnosis not present

## 2017-09-17 DIAGNOSIS — I10 Essential (primary) hypertension: Secondary | ICD-10-CM | POA: Diagnosis not present

## 2017-09-18 DIAGNOSIS — I10 Essential (primary) hypertension: Secondary | ICD-10-CM | POA: Diagnosis not present

## 2017-09-18 DIAGNOSIS — M1990 Unspecified osteoarthritis, unspecified site: Secondary | ICD-10-CM | POA: Diagnosis not present

## 2017-09-18 DIAGNOSIS — E785 Hyperlipidemia, unspecified: Secondary | ICD-10-CM | POA: Diagnosis not present

## 2017-09-18 DIAGNOSIS — F329 Major depressive disorder, single episode, unspecified: Secondary | ICD-10-CM | POA: Diagnosis not present

## 2017-09-18 DIAGNOSIS — E039 Hypothyroidism, unspecified: Secondary | ICD-10-CM | POA: Diagnosis not present

## 2017-09-18 DIAGNOSIS — G309 Alzheimer's disease, unspecified: Secondary | ICD-10-CM | POA: Diagnosis not present

## 2017-09-23 DIAGNOSIS — E46 Unspecified protein-calorie malnutrition: Secondary | ICD-10-CM | POA: Diagnosis not present

## 2017-09-23 DIAGNOSIS — F329 Major depressive disorder, single episode, unspecified: Secondary | ICD-10-CM | POA: Diagnosis not present

## 2017-09-23 DIAGNOSIS — E785 Hyperlipidemia, unspecified: Secondary | ICD-10-CM | POA: Diagnosis not present

## 2017-09-23 DIAGNOSIS — E7849 Other hyperlipidemia: Secondary | ICD-10-CM | POA: Diagnosis not present

## 2017-09-23 DIAGNOSIS — F3289 Other specified depressive episodes: Secondary | ICD-10-CM | POA: Diagnosis not present

## 2017-09-23 DIAGNOSIS — M1990 Unspecified osteoarthritis, unspecified site: Secondary | ICD-10-CM | POA: Diagnosis not present

## 2017-09-23 DIAGNOSIS — R531 Weakness: Secondary | ICD-10-CM | POA: Diagnosis not present

## 2017-09-23 DIAGNOSIS — G250 Essential tremor: Secondary | ICD-10-CM | POA: Diagnosis not present

## 2017-09-23 DIAGNOSIS — E039 Hypothyroidism, unspecified: Secondary | ICD-10-CM | POA: Diagnosis not present

## 2017-09-23 DIAGNOSIS — I6389 Other cerebral infarction: Secondary | ICD-10-CM | POA: Diagnosis not present

## 2017-09-23 DIAGNOSIS — I10 Essential (primary) hypertension: Secondary | ICD-10-CM | POA: Diagnosis not present

## 2017-09-23 DIAGNOSIS — E038 Other specified hypothyroidism: Secondary | ICD-10-CM | POA: Diagnosis not present

## 2017-09-23 DIAGNOSIS — G309 Alzheimer's disease, unspecified: Secondary | ICD-10-CM | POA: Diagnosis not present

## 2017-09-24 DIAGNOSIS — E039 Hypothyroidism, unspecified: Secondary | ICD-10-CM | POA: Diagnosis not present

## 2017-09-24 DIAGNOSIS — F329 Major depressive disorder, single episode, unspecified: Secondary | ICD-10-CM | POA: Diagnosis not present

## 2017-09-24 DIAGNOSIS — G309 Alzheimer's disease, unspecified: Secondary | ICD-10-CM | POA: Diagnosis not present

## 2017-09-24 DIAGNOSIS — M1990 Unspecified osteoarthritis, unspecified site: Secondary | ICD-10-CM | POA: Diagnosis not present

## 2017-09-24 DIAGNOSIS — I10 Essential (primary) hypertension: Secondary | ICD-10-CM | POA: Diagnosis not present

## 2017-09-24 DIAGNOSIS — E785 Hyperlipidemia, unspecified: Secondary | ICD-10-CM | POA: Diagnosis not present

## 2017-09-25 DIAGNOSIS — M1990 Unspecified osteoarthritis, unspecified site: Secondary | ICD-10-CM | POA: Diagnosis not present

## 2017-09-25 DIAGNOSIS — F329 Major depressive disorder, single episode, unspecified: Secondary | ICD-10-CM | POA: Diagnosis not present

## 2017-09-25 DIAGNOSIS — G309 Alzheimer's disease, unspecified: Secondary | ICD-10-CM | POA: Diagnosis not present

## 2017-09-25 DIAGNOSIS — E785 Hyperlipidemia, unspecified: Secondary | ICD-10-CM | POA: Diagnosis not present

## 2017-09-25 DIAGNOSIS — I10 Essential (primary) hypertension: Secondary | ICD-10-CM | POA: Diagnosis not present

## 2017-09-25 DIAGNOSIS — E039 Hypothyroidism, unspecified: Secondary | ICD-10-CM | POA: Diagnosis not present

## 2017-09-26 DIAGNOSIS — G309 Alzheimer's disease, unspecified: Secondary | ICD-10-CM | POA: Diagnosis not present

## 2017-09-26 DIAGNOSIS — M1990 Unspecified osteoarthritis, unspecified site: Secondary | ICD-10-CM | POA: Diagnosis not present

## 2017-09-26 DIAGNOSIS — I10 Essential (primary) hypertension: Secondary | ICD-10-CM | POA: Diagnosis not present

## 2017-09-26 DIAGNOSIS — E785 Hyperlipidemia, unspecified: Secondary | ICD-10-CM | POA: Diagnosis not present

## 2017-09-26 DIAGNOSIS — E039 Hypothyroidism, unspecified: Secondary | ICD-10-CM | POA: Diagnosis not present

## 2017-09-26 DIAGNOSIS — F329 Major depressive disorder, single episode, unspecified: Secondary | ICD-10-CM | POA: Diagnosis not present

## 2017-09-28 DIAGNOSIS — G309 Alzheimer's disease, unspecified: Secondary | ICD-10-CM | POA: Diagnosis not present

## 2017-09-28 DIAGNOSIS — I10 Essential (primary) hypertension: Secondary | ICD-10-CM | POA: Diagnosis not present

## 2017-09-28 DIAGNOSIS — R531 Weakness: Secondary | ICD-10-CM | POA: Diagnosis not present

## 2017-09-28 DIAGNOSIS — E46 Unspecified protein-calorie malnutrition: Secondary | ICD-10-CM | POA: Diagnosis not present

## 2017-09-28 DIAGNOSIS — F329 Major depressive disorder, single episode, unspecified: Secondary | ICD-10-CM | POA: Diagnosis not present

## 2017-09-28 DIAGNOSIS — M1990 Unspecified osteoarthritis, unspecified site: Secondary | ICD-10-CM | POA: Diagnosis not present

## 2017-09-28 DIAGNOSIS — E785 Hyperlipidemia, unspecified: Secondary | ICD-10-CM | POA: Diagnosis not present

## 2017-09-28 DIAGNOSIS — E039 Hypothyroidism, unspecified: Secondary | ICD-10-CM | POA: Diagnosis not present

## 2017-09-28 DIAGNOSIS — I639 Cerebral infarction, unspecified: Secondary | ICD-10-CM | POA: Diagnosis not present

## 2017-09-28 DIAGNOSIS — G250 Essential tremor: Secondary | ICD-10-CM | POA: Diagnosis not present

## 2017-09-30 DIAGNOSIS — E039 Hypothyroidism, unspecified: Secondary | ICD-10-CM | POA: Diagnosis not present

## 2017-09-30 DIAGNOSIS — E785 Hyperlipidemia, unspecified: Secondary | ICD-10-CM | POA: Diagnosis not present

## 2017-09-30 DIAGNOSIS — I10 Essential (primary) hypertension: Secondary | ICD-10-CM | POA: Diagnosis not present

## 2017-09-30 DIAGNOSIS — F329 Major depressive disorder, single episode, unspecified: Secondary | ICD-10-CM | POA: Diagnosis not present

## 2017-09-30 DIAGNOSIS — M1990 Unspecified osteoarthritis, unspecified site: Secondary | ICD-10-CM | POA: Diagnosis not present

## 2017-09-30 DIAGNOSIS — G309 Alzheimer's disease, unspecified: Secondary | ICD-10-CM | POA: Diagnosis not present

## 2017-10-02 DIAGNOSIS — F329 Major depressive disorder, single episode, unspecified: Secondary | ICD-10-CM | POA: Diagnosis not present

## 2017-10-02 DIAGNOSIS — G309 Alzheimer's disease, unspecified: Secondary | ICD-10-CM | POA: Diagnosis not present

## 2017-10-02 DIAGNOSIS — M1990 Unspecified osteoarthritis, unspecified site: Secondary | ICD-10-CM | POA: Diagnosis not present

## 2017-10-02 DIAGNOSIS — E039 Hypothyroidism, unspecified: Secondary | ICD-10-CM | POA: Diagnosis not present

## 2017-10-02 DIAGNOSIS — E785 Hyperlipidemia, unspecified: Secondary | ICD-10-CM | POA: Diagnosis not present

## 2017-10-02 DIAGNOSIS — I10 Essential (primary) hypertension: Secondary | ICD-10-CM | POA: Diagnosis not present

## 2017-10-03 DIAGNOSIS — F329 Major depressive disorder, single episode, unspecified: Secondary | ICD-10-CM | POA: Diagnosis not present

## 2017-10-03 DIAGNOSIS — I10 Essential (primary) hypertension: Secondary | ICD-10-CM | POA: Diagnosis not present

## 2017-10-03 DIAGNOSIS — G309 Alzheimer's disease, unspecified: Secondary | ICD-10-CM | POA: Diagnosis not present

## 2017-10-03 DIAGNOSIS — M1990 Unspecified osteoarthritis, unspecified site: Secondary | ICD-10-CM | POA: Diagnosis not present

## 2017-10-03 DIAGNOSIS — E785 Hyperlipidemia, unspecified: Secondary | ICD-10-CM | POA: Diagnosis not present

## 2017-10-03 DIAGNOSIS — E039 Hypothyroidism, unspecified: Secondary | ICD-10-CM | POA: Diagnosis not present

## 2017-10-04 DIAGNOSIS — M1990 Unspecified osteoarthritis, unspecified site: Secondary | ICD-10-CM | POA: Diagnosis not present

## 2017-10-04 DIAGNOSIS — E785 Hyperlipidemia, unspecified: Secondary | ICD-10-CM | POA: Diagnosis not present

## 2017-10-04 DIAGNOSIS — E039 Hypothyroidism, unspecified: Secondary | ICD-10-CM | POA: Diagnosis not present

## 2017-10-04 DIAGNOSIS — I10 Essential (primary) hypertension: Secondary | ICD-10-CM | POA: Diagnosis not present

## 2017-10-04 DIAGNOSIS — F329 Major depressive disorder, single episode, unspecified: Secondary | ICD-10-CM | POA: Diagnosis not present

## 2017-10-04 DIAGNOSIS — G309 Alzheimer's disease, unspecified: Secondary | ICD-10-CM | POA: Diagnosis not present

## 2017-10-08 DIAGNOSIS — E785 Hyperlipidemia, unspecified: Secondary | ICD-10-CM | POA: Diagnosis not present

## 2017-10-08 DIAGNOSIS — I10 Essential (primary) hypertension: Secondary | ICD-10-CM | POA: Diagnosis not present

## 2017-10-08 DIAGNOSIS — F329 Major depressive disorder, single episode, unspecified: Secondary | ICD-10-CM | POA: Diagnosis not present

## 2017-10-08 DIAGNOSIS — E039 Hypothyroidism, unspecified: Secondary | ICD-10-CM | POA: Diagnosis not present

## 2017-10-08 DIAGNOSIS — G309 Alzheimer's disease, unspecified: Secondary | ICD-10-CM | POA: Diagnosis not present

## 2017-10-08 DIAGNOSIS — M1990 Unspecified osteoarthritis, unspecified site: Secondary | ICD-10-CM | POA: Diagnosis not present

## 2017-10-09 DIAGNOSIS — M1990 Unspecified osteoarthritis, unspecified site: Secondary | ICD-10-CM | POA: Diagnosis not present

## 2017-10-09 DIAGNOSIS — G309 Alzheimer's disease, unspecified: Secondary | ICD-10-CM | POA: Diagnosis not present

## 2017-10-09 DIAGNOSIS — E039 Hypothyroidism, unspecified: Secondary | ICD-10-CM | POA: Diagnosis not present

## 2017-10-09 DIAGNOSIS — F329 Major depressive disorder, single episode, unspecified: Secondary | ICD-10-CM | POA: Diagnosis not present

## 2017-10-09 DIAGNOSIS — E785 Hyperlipidemia, unspecified: Secondary | ICD-10-CM | POA: Diagnosis not present

## 2017-10-09 DIAGNOSIS — I10 Essential (primary) hypertension: Secondary | ICD-10-CM | POA: Diagnosis not present

## 2017-10-10 DIAGNOSIS — I10 Essential (primary) hypertension: Secondary | ICD-10-CM | POA: Diagnosis not present

## 2017-10-10 DIAGNOSIS — E039 Hypothyroidism, unspecified: Secondary | ICD-10-CM | POA: Diagnosis not present

## 2017-10-10 DIAGNOSIS — M1990 Unspecified osteoarthritis, unspecified site: Secondary | ICD-10-CM | POA: Diagnosis not present

## 2017-10-10 DIAGNOSIS — F329 Major depressive disorder, single episode, unspecified: Secondary | ICD-10-CM | POA: Diagnosis not present

## 2017-10-10 DIAGNOSIS — G309 Alzheimer's disease, unspecified: Secondary | ICD-10-CM | POA: Diagnosis not present

## 2017-10-10 DIAGNOSIS — E785 Hyperlipidemia, unspecified: Secondary | ICD-10-CM | POA: Diagnosis not present

## 2017-10-11 DIAGNOSIS — E039 Hypothyroidism, unspecified: Secondary | ICD-10-CM | POA: Diagnosis not present

## 2017-10-11 DIAGNOSIS — E785 Hyperlipidemia, unspecified: Secondary | ICD-10-CM | POA: Diagnosis not present

## 2017-10-11 DIAGNOSIS — I10 Essential (primary) hypertension: Secondary | ICD-10-CM | POA: Diagnosis not present

## 2017-10-11 DIAGNOSIS — M1990 Unspecified osteoarthritis, unspecified site: Secondary | ICD-10-CM | POA: Diagnosis not present

## 2017-10-11 DIAGNOSIS — F329 Major depressive disorder, single episode, unspecified: Secondary | ICD-10-CM | POA: Diagnosis not present

## 2017-10-11 DIAGNOSIS — G309 Alzheimer's disease, unspecified: Secondary | ICD-10-CM | POA: Diagnosis not present

## 2017-10-14 DIAGNOSIS — M1990 Unspecified osteoarthritis, unspecified site: Secondary | ICD-10-CM | POA: Diagnosis not present

## 2017-10-14 DIAGNOSIS — I10 Essential (primary) hypertension: Secondary | ICD-10-CM | POA: Diagnosis not present

## 2017-10-14 DIAGNOSIS — G309 Alzheimer's disease, unspecified: Secondary | ICD-10-CM | POA: Diagnosis not present

## 2017-10-14 DIAGNOSIS — F329 Major depressive disorder, single episode, unspecified: Secondary | ICD-10-CM | POA: Diagnosis not present

## 2017-10-14 DIAGNOSIS — E039 Hypothyroidism, unspecified: Secondary | ICD-10-CM | POA: Diagnosis not present

## 2017-10-14 DIAGNOSIS — E785 Hyperlipidemia, unspecified: Secondary | ICD-10-CM | POA: Diagnosis not present

## 2017-10-15 DIAGNOSIS — E039 Hypothyroidism, unspecified: Secondary | ICD-10-CM | POA: Diagnosis not present

## 2017-10-15 DIAGNOSIS — I10 Essential (primary) hypertension: Secondary | ICD-10-CM | POA: Diagnosis not present

## 2017-10-15 DIAGNOSIS — M1990 Unspecified osteoarthritis, unspecified site: Secondary | ICD-10-CM | POA: Diagnosis not present

## 2017-10-15 DIAGNOSIS — G309 Alzheimer's disease, unspecified: Secondary | ICD-10-CM | POA: Diagnosis not present

## 2017-10-15 DIAGNOSIS — E785 Hyperlipidemia, unspecified: Secondary | ICD-10-CM | POA: Diagnosis not present

## 2017-10-15 DIAGNOSIS — F329 Major depressive disorder, single episode, unspecified: Secondary | ICD-10-CM | POA: Diagnosis not present

## 2017-10-16 DIAGNOSIS — M1990 Unspecified osteoarthritis, unspecified site: Secondary | ICD-10-CM | POA: Diagnosis not present

## 2017-10-16 DIAGNOSIS — F329 Major depressive disorder, single episode, unspecified: Secondary | ICD-10-CM | POA: Diagnosis not present

## 2017-10-16 DIAGNOSIS — E785 Hyperlipidemia, unspecified: Secondary | ICD-10-CM | POA: Diagnosis not present

## 2017-10-16 DIAGNOSIS — E039 Hypothyroidism, unspecified: Secondary | ICD-10-CM | POA: Diagnosis not present

## 2017-10-16 DIAGNOSIS — I10 Essential (primary) hypertension: Secondary | ICD-10-CM | POA: Diagnosis not present

## 2017-10-16 DIAGNOSIS — G309 Alzheimer's disease, unspecified: Secondary | ICD-10-CM | POA: Diagnosis not present

## 2017-10-17 DIAGNOSIS — I10 Essential (primary) hypertension: Secondary | ICD-10-CM | POA: Diagnosis not present

## 2017-10-17 DIAGNOSIS — F329 Major depressive disorder, single episode, unspecified: Secondary | ICD-10-CM | POA: Diagnosis not present

## 2017-10-17 DIAGNOSIS — E785 Hyperlipidemia, unspecified: Secondary | ICD-10-CM | POA: Diagnosis not present

## 2017-10-17 DIAGNOSIS — M1990 Unspecified osteoarthritis, unspecified site: Secondary | ICD-10-CM | POA: Diagnosis not present

## 2017-10-17 DIAGNOSIS — E039 Hypothyroidism, unspecified: Secondary | ICD-10-CM | POA: Diagnosis not present

## 2017-10-17 DIAGNOSIS — G309 Alzheimer's disease, unspecified: Secondary | ICD-10-CM | POA: Diagnosis not present

## 2017-10-21 DIAGNOSIS — I10 Essential (primary) hypertension: Secondary | ICD-10-CM | POA: Diagnosis not present

## 2017-10-21 DIAGNOSIS — M1990 Unspecified osteoarthritis, unspecified site: Secondary | ICD-10-CM | POA: Diagnosis not present

## 2017-10-21 DIAGNOSIS — E785 Hyperlipidemia, unspecified: Secondary | ICD-10-CM | POA: Diagnosis not present

## 2017-10-21 DIAGNOSIS — G309 Alzheimer's disease, unspecified: Secondary | ICD-10-CM | POA: Diagnosis not present

## 2017-10-21 DIAGNOSIS — F329 Major depressive disorder, single episode, unspecified: Secondary | ICD-10-CM | POA: Diagnosis not present

## 2017-10-21 DIAGNOSIS — E039 Hypothyroidism, unspecified: Secondary | ICD-10-CM | POA: Diagnosis not present

## 2017-10-24 DIAGNOSIS — E785 Hyperlipidemia, unspecified: Secondary | ICD-10-CM | POA: Diagnosis not present

## 2017-10-24 DIAGNOSIS — M1990 Unspecified osteoarthritis, unspecified site: Secondary | ICD-10-CM | POA: Diagnosis not present

## 2017-10-24 DIAGNOSIS — F329 Major depressive disorder, single episode, unspecified: Secondary | ICD-10-CM | POA: Diagnosis not present

## 2017-10-24 DIAGNOSIS — G309 Alzheimer's disease, unspecified: Secondary | ICD-10-CM | POA: Diagnosis not present

## 2017-10-24 DIAGNOSIS — I10 Essential (primary) hypertension: Secondary | ICD-10-CM | POA: Diagnosis not present

## 2017-10-24 DIAGNOSIS — E039 Hypothyroidism, unspecified: Secondary | ICD-10-CM | POA: Diagnosis not present

## 2017-10-25 DIAGNOSIS — E785 Hyperlipidemia, unspecified: Secondary | ICD-10-CM | POA: Diagnosis not present

## 2017-10-25 DIAGNOSIS — F329 Major depressive disorder, single episode, unspecified: Secondary | ICD-10-CM | POA: Diagnosis not present

## 2017-10-25 DIAGNOSIS — I10 Essential (primary) hypertension: Secondary | ICD-10-CM | POA: Diagnosis not present

## 2017-10-25 DIAGNOSIS — E039 Hypothyroidism, unspecified: Secondary | ICD-10-CM | POA: Diagnosis not present

## 2017-10-25 DIAGNOSIS — M1990 Unspecified osteoarthritis, unspecified site: Secondary | ICD-10-CM | POA: Diagnosis not present

## 2017-10-25 DIAGNOSIS — G309 Alzheimer's disease, unspecified: Secondary | ICD-10-CM | POA: Diagnosis not present

## 2017-10-28 DIAGNOSIS — E785 Hyperlipidemia, unspecified: Secondary | ICD-10-CM | POA: Diagnosis not present

## 2017-10-28 DIAGNOSIS — G309 Alzheimer's disease, unspecified: Secondary | ICD-10-CM | POA: Diagnosis not present

## 2017-10-28 DIAGNOSIS — E039 Hypothyroidism, unspecified: Secondary | ICD-10-CM | POA: Diagnosis not present

## 2017-10-28 DIAGNOSIS — I10 Essential (primary) hypertension: Secondary | ICD-10-CM | POA: Diagnosis not present

## 2017-10-28 DIAGNOSIS — F329 Major depressive disorder, single episode, unspecified: Secondary | ICD-10-CM | POA: Diagnosis not present

## 2017-10-28 DIAGNOSIS — M1990 Unspecified osteoarthritis, unspecified site: Secondary | ICD-10-CM | POA: Diagnosis not present

## 2017-10-29 DIAGNOSIS — I10 Essential (primary) hypertension: Secondary | ICD-10-CM | POA: Diagnosis not present

## 2017-10-29 DIAGNOSIS — E785 Hyperlipidemia, unspecified: Secondary | ICD-10-CM | POA: Diagnosis not present

## 2017-10-29 DIAGNOSIS — G250 Essential tremor: Secondary | ICD-10-CM | POA: Diagnosis not present

## 2017-10-29 DIAGNOSIS — E039 Hypothyroidism, unspecified: Secondary | ICD-10-CM | POA: Diagnosis not present

## 2017-10-29 DIAGNOSIS — F329 Major depressive disorder, single episode, unspecified: Secondary | ICD-10-CM | POA: Diagnosis not present

## 2017-10-29 DIAGNOSIS — I639 Cerebral infarction, unspecified: Secondary | ICD-10-CM | POA: Diagnosis not present

## 2017-10-29 DIAGNOSIS — R531 Weakness: Secondary | ICD-10-CM | POA: Diagnosis not present

## 2017-10-29 DIAGNOSIS — E46 Unspecified protein-calorie malnutrition: Secondary | ICD-10-CM | POA: Diagnosis not present

## 2017-10-29 DIAGNOSIS — M1990 Unspecified osteoarthritis, unspecified site: Secondary | ICD-10-CM | POA: Diagnosis not present

## 2017-10-29 DIAGNOSIS — G309 Alzheimer's disease, unspecified: Secondary | ICD-10-CM | POA: Diagnosis not present

## 2017-10-30 DIAGNOSIS — I10 Essential (primary) hypertension: Secondary | ICD-10-CM | POA: Diagnosis not present

## 2017-10-30 DIAGNOSIS — E785 Hyperlipidemia, unspecified: Secondary | ICD-10-CM | POA: Diagnosis not present

## 2017-10-30 DIAGNOSIS — E039 Hypothyroidism, unspecified: Secondary | ICD-10-CM | POA: Diagnosis not present

## 2017-10-30 DIAGNOSIS — F329 Major depressive disorder, single episode, unspecified: Secondary | ICD-10-CM | POA: Diagnosis not present

## 2017-10-30 DIAGNOSIS — M1990 Unspecified osteoarthritis, unspecified site: Secondary | ICD-10-CM | POA: Diagnosis not present

## 2017-10-30 DIAGNOSIS — G309 Alzheimer's disease, unspecified: Secondary | ICD-10-CM | POA: Diagnosis not present

## 2017-10-31 DIAGNOSIS — E039 Hypothyroidism, unspecified: Secondary | ICD-10-CM | POA: Diagnosis not present

## 2017-10-31 DIAGNOSIS — E785 Hyperlipidemia, unspecified: Secondary | ICD-10-CM | POA: Diagnosis not present

## 2017-10-31 DIAGNOSIS — G309 Alzheimer's disease, unspecified: Secondary | ICD-10-CM | POA: Diagnosis not present

## 2017-10-31 DIAGNOSIS — M1990 Unspecified osteoarthritis, unspecified site: Secondary | ICD-10-CM | POA: Diagnosis not present

## 2017-10-31 DIAGNOSIS — F329 Major depressive disorder, single episode, unspecified: Secondary | ICD-10-CM | POA: Diagnosis not present

## 2017-10-31 DIAGNOSIS — I10 Essential (primary) hypertension: Secondary | ICD-10-CM | POA: Diagnosis not present

## 2017-11-01 DIAGNOSIS — E039 Hypothyroidism, unspecified: Secondary | ICD-10-CM | POA: Diagnosis not present

## 2017-11-01 DIAGNOSIS — F329 Major depressive disorder, single episode, unspecified: Secondary | ICD-10-CM | POA: Diagnosis not present

## 2017-11-01 DIAGNOSIS — M1990 Unspecified osteoarthritis, unspecified site: Secondary | ICD-10-CM | POA: Diagnosis not present

## 2017-11-01 DIAGNOSIS — G309 Alzheimer's disease, unspecified: Secondary | ICD-10-CM | POA: Diagnosis not present

## 2017-11-01 DIAGNOSIS — I10 Essential (primary) hypertension: Secondary | ICD-10-CM | POA: Diagnosis not present

## 2017-11-01 DIAGNOSIS — E785 Hyperlipidemia, unspecified: Secondary | ICD-10-CM | POA: Diagnosis not present

## 2017-11-05 DIAGNOSIS — E785 Hyperlipidemia, unspecified: Secondary | ICD-10-CM | POA: Diagnosis not present

## 2017-11-05 DIAGNOSIS — F329 Major depressive disorder, single episode, unspecified: Secondary | ICD-10-CM | POA: Diagnosis not present

## 2017-11-05 DIAGNOSIS — M1990 Unspecified osteoarthritis, unspecified site: Secondary | ICD-10-CM | POA: Diagnosis not present

## 2017-11-05 DIAGNOSIS — G309 Alzheimer's disease, unspecified: Secondary | ICD-10-CM | POA: Diagnosis not present

## 2017-11-05 DIAGNOSIS — I10 Essential (primary) hypertension: Secondary | ICD-10-CM | POA: Diagnosis not present

## 2017-11-05 DIAGNOSIS — E039 Hypothyroidism, unspecified: Secondary | ICD-10-CM | POA: Diagnosis not present

## 2017-11-06 DIAGNOSIS — E039 Hypothyroidism, unspecified: Secondary | ICD-10-CM | POA: Diagnosis not present

## 2017-11-06 DIAGNOSIS — M1990 Unspecified osteoarthritis, unspecified site: Secondary | ICD-10-CM | POA: Diagnosis not present

## 2017-11-06 DIAGNOSIS — I10 Essential (primary) hypertension: Secondary | ICD-10-CM | POA: Diagnosis not present

## 2017-11-06 DIAGNOSIS — F329 Major depressive disorder, single episode, unspecified: Secondary | ICD-10-CM | POA: Diagnosis not present

## 2017-11-06 DIAGNOSIS — G309 Alzheimer's disease, unspecified: Secondary | ICD-10-CM | POA: Diagnosis not present

## 2017-11-06 DIAGNOSIS — E785 Hyperlipidemia, unspecified: Secondary | ICD-10-CM | POA: Diagnosis not present

## 2017-11-07 DIAGNOSIS — M1990 Unspecified osteoarthritis, unspecified site: Secondary | ICD-10-CM | POA: Diagnosis not present

## 2017-11-07 DIAGNOSIS — F329 Major depressive disorder, single episode, unspecified: Secondary | ICD-10-CM | POA: Diagnosis not present

## 2017-11-07 DIAGNOSIS — E039 Hypothyroidism, unspecified: Secondary | ICD-10-CM | POA: Diagnosis not present

## 2017-11-07 DIAGNOSIS — G309 Alzheimer's disease, unspecified: Secondary | ICD-10-CM | POA: Diagnosis not present

## 2017-11-07 DIAGNOSIS — E785 Hyperlipidemia, unspecified: Secondary | ICD-10-CM | POA: Diagnosis not present

## 2017-11-07 DIAGNOSIS — I10 Essential (primary) hypertension: Secondary | ICD-10-CM | POA: Diagnosis not present

## 2017-11-08 DIAGNOSIS — M1990 Unspecified osteoarthritis, unspecified site: Secondary | ICD-10-CM | POA: Diagnosis not present

## 2017-11-08 DIAGNOSIS — I10 Essential (primary) hypertension: Secondary | ICD-10-CM | POA: Diagnosis not present

## 2017-11-08 DIAGNOSIS — F329 Major depressive disorder, single episode, unspecified: Secondary | ICD-10-CM | POA: Diagnosis not present

## 2017-11-08 DIAGNOSIS — E785 Hyperlipidemia, unspecified: Secondary | ICD-10-CM | POA: Diagnosis not present

## 2017-11-08 DIAGNOSIS — E039 Hypothyroidism, unspecified: Secondary | ICD-10-CM | POA: Diagnosis not present

## 2017-11-08 DIAGNOSIS — G309 Alzheimer's disease, unspecified: Secondary | ICD-10-CM | POA: Diagnosis not present

## 2017-11-12 DIAGNOSIS — F329 Major depressive disorder, single episode, unspecified: Secondary | ICD-10-CM | POA: Diagnosis not present

## 2017-11-12 DIAGNOSIS — I10 Essential (primary) hypertension: Secondary | ICD-10-CM | POA: Diagnosis not present

## 2017-11-12 DIAGNOSIS — M1990 Unspecified osteoarthritis, unspecified site: Secondary | ICD-10-CM | POA: Diagnosis not present

## 2017-11-12 DIAGNOSIS — E785 Hyperlipidemia, unspecified: Secondary | ICD-10-CM | POA: Diagnosis not present

## 2017-11-12 DIAGNOSIS — G309 Alzheimer's disease, unspecified: Secondary | ICD-10-CM | POA: Diagnosis not present

## 2017-11-12 DIAGNOSIS — E039 Hypothyroidism, unspecified: Secondary | ICD-10-CM | POA: Diagnosis not present

## 2017-11-13 DIAGNOSIS — E039 Hypothyroidism, unspecified: Secondary | ICD-10-CM | POA: Diagnosis not present

## 2017-11-13 DIAGNOSIS — F329 Major depressive disorder, single episode, unspecified: Secondary | ICD-10-CM | POA: Diagnosis not present

## 2017-11-13 DIAGNOSIS — E785 Hyperlipidemia, unspecified: Secondary | ICD-10-CM | POA: Diagnosis not present

## 2017-11-13 DIAGNOSIS — G309 Alzheimer's disease, unspecified: Secondary | ICD-10-CM | POA: Diagnosis not present

## 2017-11-13 DIAGNOSIS — M1990 Unspecified osteoarthritis, unspecified site: Secondary | ICD-10-CM | POA: Diagnosis not present

## 2017-11-13 DIAGNOSIS — I10 Essential (primary) hypertension: Secondary | ICD-10-CM | POA: Diagnosis not present

## 2017-11-14 DIAGNOSIS — F329 Major depressive disorder, single episode, unspecified: Secondary | ICD-10-CM | POA: Diagnosis not present

## 2017-11-14 DIAGNOSIS — E039 Hypothyroidism, unspecified: Secondary | ICD-10-CM | POA: Diagnosis not present

## 2017-11-14 DIAGNOSIS — I10 Essential (primary) hypertension: Secondary | ICD-10-CM | POA: Diagnosis not present

## 2017-11-14 DIAGNOSIS — M1990 Unspecified osteoarthritis, unspecified site: Secondary | ICD-10-CM | POA: Diagnosis not present

## 2017-11-14 DIAGNOSIS — G309 Alzheimer's disease, unspecified: Secondary | ICD-10-CM | POA: Diagnosis not present

## 2017-11-14 DIAGNOSIS — E785 Hyperlipidemia, unspecified: Secondary | ICD-10-CM | POA: Diagnosis not present

## 2017-11-15 DIAGNOSIS — E039 Hypothyroidism, unspecified: Secondary | ICD-10-CM | POA: Diagnosis not present

## 2017-11-15 DIAGNOSIS — M1990 Unspecified osteoarthritis, unspecified site: Secondary | ICD-10-CM | POA: Diagnosis not present

## 2017-11-15 DIAGNOSIS — I10 Essential (primary) hypertension: Secondary | ICD-10-CM | POA: Diagnosis not present

## 2017-11-15 DIAGNOSIS — F329 Major depressive disorder, single episode, unspecified: Secondary | ICD-10-CM | POA: Diagnosis not present

## 2017-11-15 DIAGNOSIS — G309 Alzheimer's disease, unspecified: Secondary | ICD-10-CM | POA: Diagnosis not present

## 2017-11-15 DIAGNOSIS — E785 Hyperlipidemia, unspecified: Secondary | ICD-10-CM | POA: Diagnosis not present

## 2017-11-19 DIAGNOSIS — I10 Essential (primary) hypertension: Secondary | ICD-10-CM | POA: Diagnosis not present

## 2017-11-19 DIAGNOSIS — E785 Hyperlipidemia, unspecified: Secondary | ICD-10-CM | POA: Diagnosis not present

## 2017-11-19 DIAGNOSIS — F329 Major depressive disorder, single episode, unspecified: Secondary | ICD-10-CM | POA: Diagnosis not present

## 2017-11-19 DIAGNOSIS — G309 Alzheimer's disease, unspecified: Secondary | ICD-10-CM | POA: Diagnosis not present

## 2017-11-19 DIAGNOSIS — E039 Hypothyroidism, unspecified: Secondary | ICD-10-CM | POA: Diagnosis not present

## 2017-11-19 DIAGNOSIS — M1990 Unspecified osteoarthritis, unspecified site: Secondary | ICD-10-CM | POA: Diagnosis not present

## 2017-11-20 DIAGNOSIS — E785 Hyperlipidemia, unspecified: Secondary | ICD-10-CM | POA: Diagnosis not present

## 2017-11-20 DIAGNOSIS — I10 Essential (primary) hypertension: Secondary | ICD-10-CM | POA: Diagnosis not present

## 2017-11-20 DIAGNOSIS — F329 Major depressive disorder, single episode, unspecified: Secondary | ICD-10-CM | POA: Diagnosis not present

## 2017-11-20 DIAGNOSIS — E039 Hypothyroidism, unspecified: Secondary | ICD-10-CM | POA: Diagnosis not present

## 2017-11-20 DIAGNOSIS — M1990 Unspecified osteoarthritis, unspecified site: Secondary | ICD-10-CM | POA: Diagnosis not present

## 2017-11-20 DIAGNOSIS — G309 Alzheimer's disease, unspecified: Secondary | ICD-10-CM | POA: Diagnosis not present

## 2017-11-21 DIAGNOSIS — E039 Hypothyroidism, unspecified: Secondary | ICD-10-CM | POA: Diagnosis not present

## 2017-11-21 DIAGNOSIS — G309 Alzheimer's disease, unspecified: Secondary | ICD-10-CM | POA: Diagnosis not present

## 2017-11-21 DIAGNOSIS — I10 Essential (primary) hypertension: Secondary | ICD-10-CM | POA: Diagnosis not present

## 2017-11-21 DIAGNOSIS — E785 Hyperlipidemia, unspecified: Secondary | ICD-10-CM | POA: Diagnosis not present

## 2017-11-21 DIAGNOSIS — F329 Major depressive disorder, single episode, unspecified: Secondary | ICD-10-CM | POA: Diagnosis not present

## 2017-11-21 DIAGNOSIS — M1990 Unspecified osteoarthritis, unspecified site: Secondary | ICD-10-CM | POA: Diagnosis not present

## 2017-11-22 DIAGNOSIS — E039 Hypothyroidism, unspecified: Secondary | ICD-10-CM | POA: Diagnosis not present

## 2017-11-22 DIAGNOSIS — I10 Essential (primary) hypertension: Secondary | ICD-10-CM | POA: Diagnosis not present

## 2017-11-22 DIAGNOSIS — G309 Alzheimer's disease, unspecified: Secondary | ICD-10-CM | POA: Diagnosis not present

## 2017-11-22 DIAGNOSIS — F329 Major depressive disorder, single episode, unspecified: Secondary | ICD-10-CM | POA: Diagnosis not present

## 2017-11-22 DIAGNOSIS — E785 Hyperlipidemia, unspecified: Secondary | ICD-10-CM | POA: Diagnosis not present

## 2017-11-22 DIAGNOSIS — M1990 Unspecified osteoarthritis, unspecified site: Secondary | ICD-10-CM | POA: Diagnosis not present

## 2017-11-26 DIAGNOSIS — E785 Hyperlipidemia, unspecified: Secondary | ICD-10-CM | POA: Diagnosis not present

## 2017-11-26 DIAGNOSIS — M1990 Unspecified osteoarthritis, unspecified site: Secondary | ICD-10-CM | POA: Diagnosis not present

## 2017-11-26 DIAGNOSIS — I10 Essential (primary) hypertension: Secondary | ICD-10-CM | POA: Diagnosis not present

## 2017-11-26 DIAGNOSIS — F329 Major depressive disorder, single episode, unspecified: Secondary | ICD-10-CM | POA: Diagnosis not present

## 2017-11-26 DIAGNOSIS — E039 Hypothyroidism, unspecified: Secondary | ICD-10-CM | POA: Diagnosis not present

## 2017-11-26 DIAGNOSIS — G309 Alzheimer's disease, unspecified: Secondary | ICD-10-CM | POA: Diagnosis not present

## 2017-11-27 DIAGNOSIS — F329 Major depressive disorder, single episode, unspecified: Secondary | ICD-10-CM | POA: Diagnosis not present

## 2017-11-27 DIAGNOSIS — E039 Hypothyroidism, unspecified: Secondary | ICD-10-CM | POA: Diagnosis not present

## 2017-11-27 DIAGNOSIS — M1990 Unspecified osteoarthritis, unspecified site: Secondary | ICD-10-CM | POA: Diagnosis not present

## 2017-11-27 DIAGNOSIS — G309 Alzheimer's disease, unspecified: Secondary | ICD-10-CM | POA: Diagnosis not present

## 2017-11-27 DIAGNOSIS — E785 Hyperlipidemia, unspecified: Secondary | ICD-10-CM | POA: Diagnosis not present

## 2017-11-27 DIAGNOSIS — I10 Essential (primary) hypertension: Secondary | ICD-10-CM | POA: Diagnosis not present

## 2017-11-28 DIAGNOSIS — G20 Parkinson's disease: Secondary | ICD-10-CM | POA: Diagnosis not present

## 2017-11-28 DIAGNOSIS — Z1389 Encounter for screening for other disorder: Secondary | ICD-10-CM | POA: Diagnosis not present

## 2017-11-28 DIAGNOSIS — E039 Hypothyroidism, unspecified: Secondary | ICD-10-CM | POA: Diagnosis not present

## 2017-11-28 DIAGNOSIS — G309 Alzheimer's disease, unspecified: Secondary | ICD-10-CM | POA: Diagnosis not present

## 2017-11-28 DIAGNOSIS — Z86711 Personal history of pulmonary embolism: Secondary | ICD-10-CM | POA: Diagnosis not present

## 2017-11-28 DIAGNOSIS — E038 Other specified hypothyroidism: Secondary | ICD-10-CM | POA: Diagnosis not present

## 2017-11-28 DIAGNOSIS — F329 Major depressive disorder, single episode, unspecified: Secondary | ICD-10-CM | POA: Diagnosis not present

## 2017-11-28 DIAGNOSIS — E785 Hyperlipidemia, unspecified: Secondary | ICD-10-CM | POA: Diagnosis not present

## 2017-11-28 DIAGNOSIS — E7849 Other hyperlipidemia: Secondary | ICD-10-CM | POA: Diagnosis not present

## 2017-11-28 DIAGNOSIS — G250 Essential tremor: Secondary | ICD-10-CM | POA: Diagnosis not present

## 2017-11-28 DIAGNOSIS — I10 Essential (primary) hypertension: Secondary | ICD-10-CM | POA: Diagnosis not present

## 2017-11-28 DIAGNOSIS — Z23 Encounter for immunization: Secondary | ICD-10-CM | POA: Diagnosis not present

## 2017-11-28 DIAGNOSIS — R627 Adult failure to thrive: Secondary | ICD-10-CM | POA: Diagnosis not present

## 2017-11-28 DIAGNOSIS — Z0000 Encounter for general adult medical examination without abnormal findings: Secondary | ICD-10-CM | POA: Diagnosis not present

## 2017-11-28 DIAGNOSIS — M1990 Unspecified osteoarthritis, unspecified site: Secondary | ICD-10-CM | POA: Diagnosis not present

## 2017-11-29 DIAGNOSIS — E785 Hyperlipidemia, unspecified: Secondary | ICD-10-CM | POA: Diagnosis not present

## 2017-11-29 DIAGNOSIS — E039 Hypothyroidism, unspecified: Secondary | ICD-10-CM | POA: Diagnosis not present

## 2017-11-29 DIAGNOSIS — G309 Alzheimer's disease, unspecified: Secondary | ICD-10-CM | POA: Diagnosis not present

## 2017-11-29 DIAGNOSIS — E46 Unspecified protein-calorie malnutrition: Secondary | ICD-10-CM | POA: Diagnosis not present

## 2017-11-29 DIAGNOSIS — I10 Essential (primary) hypertension: Secondary | ICD-10-CM | POA: Diagnosis not present

## 2017-11-29 DIAGNOSIS — G250 Essential tremor: Secondary | ICD-10-CM | POA: Diagnosis not present

## 2017-11-29 DIAGNOSIS — F329 Major depressive disorder, single episode, unspecified: Secondary | ICD-10-CM | POA: Diagnosis not present

## 2017-11-29 DIAGNOSIS — R531 Weakness: Secondary | ICD-10-CM | POA: Diagnosis not present

## 2017-11-29 DIAGNOSIS — M1990 Unspecified osteoarthritis, unspecified site: Secondary | ICD-10-CM | POA: Diagnosis not present

## 2017-11-29 DIAGNOSIS — I639 Cerebral infarction, unspecified: Secondary | ICD-10-CM | POA: Diagnosis not present

## 2017-12-03 DIAGNOSIS — E039 Hypothyroidism, unspecified: Secondary | ICD-10-CM | POA: Diagnosis not present

## 2017-12-03 DIAGNOSIS — F329 Major depressive disorder, single episode, unspecified: Secondary | ICD-10-CM | POA: Diagnosis not present

## 2017-12-03 DIAGNOSIS — I10 Essential (primary) hypertension: Secondary | ICD-10-CM | POA: Diagnosis not present

## 2017-12-03 DIAGNOSIS — E785 Hyperlipidemia, unspecified: Secondary | ICD-10-CM | POA: Diagnosis not present

## 2017-12-03 DIAGNOSIS — G309 Alzheimer's disease, unspecified: Secondary | ICD-10-CM | POA: Diagnosis not present

## 2017-12-03 DIAGNOSIS — M1990 Unspecified osteoarthritis, unspecified site: Secondary | ICD-10-CM | POA: Diagnosis not present

## 2017-12-04 DIAGNOSIS — E785 Hyperlipidemia, unspecified: Secondary | ICD-10-CM | POA: Diagnosis not present

## 2017-12-04 DIAGNOSIS — I10 Essential (primary) hypertension: Secondary | ICD-10-CM | POA: Diagnosis not present

## 2017-12-04 DIAGNOSIS — M1990 Unspecified osteoarthritis, unspecified site: Secondary | ICD-10-CM | POA: Diagnosis not present

## 2017-12-04 DIAGNOSIS — F329 Major depressive disorder, single episode, unspecified: Secondary | ICD-10-CM | POA: Diagnosis not present

## 2017-12-04 DIAGNOSIS — E039 Hypothyroidism, unspecified: Secondary | ICD-10-CM | POA: Diagnosis not present

## 2017-12-04 DIAGNOSIS — G309 Alzheimer's disease, unspecified: Secondary | ICD-10-CM | POA: Diagnosis not present

## 2017-12-05 DIAGNOSIS — M1990 Unspecified osteoarthritis, unspecified site: Secondary | ICD-10-CM | POA: Diagnosis not present

## 2017-12-05 DIAGNOSIS — F329 Major depressive disorder, single episode, unspecified: Secondary | ICD-10-CM | POA: Diagnosis not present

## 2017-12-05 DIAGNOSIS — G309 Alzheimer's disease, unspecified: Secondary | ICD-10-CM | POA: Diagnosis not present

## 2017-12-05 DIAGNOSIS — E039 Hypothyroidism, unspecified: Secondary | ICD-10-CM | POA: Diagnosis not present

## 2017-12-05 DIAGNOSIS — E785 Hyperlipidemia, unspecified: Secondary | ICD-10-CM | POA: Diagnosis not present

## 2017-12-05 DIAGNOSIS — I10 Essential (primary) hypertension: Secondary | ICD-10-CM | POA: Diagnosis not present

## 2017-12-06 DIAGNOSIS — G309 Alzheimer's disease, unspecified: Secondary | ICD-10-CM | POA: Diagnosis not present

## 2017-12-06 DIAGNOSIS — I10 Essential (primary) hypertension: Secondary | ICD-10-CM | POA: Diagnosis not present

## 2017-12-06 DIAGNOSIS — E785 Hyperlipidemia, unspecified: Secondary | ICD-10-CM | POA: Diagnosis not present

## 2017-12-06 DIAGNOSIS — M1990 Unspecified osteoarthritis, unspecified site: Secondary | ICD-10-CM | POA: Diagnosis not present

## 2017-12-06 DIAGNOSIS — E039 Hypothyroidism, unspecified: Secondary | ICD-10-CM | POA: Diagnosis not present

## 2017-12-06 DIAGNOSIS — F329 Major depressive disorder, single episode, unspecified: Secondary | ICD-10-CM | POA: Diagnosis not present

## 2017-12-10 DIAGNOSIS — I10 Essential (primary) hypertension: Secondary | ICD-10-CM | POA: Diagnosis not present

## 2017-12-10 DIAGNOSIS — E039 Hypothyroidism, unspecified: Secondary | ICD-10-CM | POA: Diagnosis not present

## 2017-12-10 DIAGNOSIS — G309 Alzheimer's disease, unspecified: Secondary | ICD-10-CM | POA: Diagnosis not present

## 2017-12-10 DIAGNOSIS — E785 Hyperlipidemia, unspecified: Secondary | ICD-10-CM | POA: Diagnosis not present

## 2017-12-10 DIAGNOSIS — F329 Major depressive disorder, single episode, unspecified: Secondary | ICD-10-CM | POA: Diagnosis not present

## 2017-12-10 DIAGNOSIS — M1990 Unspecified osteoarthritis, unspecified site: Secondary | ICD-10-CM | POA: Diagnosis not present

## 2017-12-11 DIAGNOSIS — G309 Alzheimer's disease, unspecified: Secondary | ICD-10-CM | POA: Diagnosis not present

## 2017-12-11 DIAGNOSIS — I10 Essential (primary) hypertension: Secondary | ICD-10-CM | POA: Diagnosis not present

## 2017-12-11 DIAGNOSIS — F329 Major depressive disorder, single episode, unspecified: Secondary | ICD-10-CM | POA: Diagnosis not present

## 2017-12-11 DIAGNOSIS — M1990 Unspecified osteoarthritis, unspecified site: Secondary | ICD-10-CM | POA: Diagnosis not present

## 2017-12-11 DIAGNOSIS — E785 Hyperlipidemia, unspecified: Secondary | ICD-10-CM | POA: Diagnosis not present

## 2017-12-11 DIAGNOSIS — E039 Hypothyroidism, unspecified: Secondary | ICD-10-CM | POA: Diagnosis not present

## 2017-12-12 DIAGNOSIS — G309 Alzheimer's disease, unspecified: Secondary | ICD-10-CM | POA: Diagnosis not present

## 2017-12-12 DIAGNOSIS — F329 Major depressive disorder, single episode, unspecified: Secondary | ICD-10-CM | POA: Diagnosis not present

## 2017-12-12 DIAGNOSIS — M1990 Unspecified osteoarthritis, unspecified site: Secondary | ICD-10-CM | POA: Diagnosis not present

## 2017-12-12 DIAGNOSIS — I10 Essential (primary) hypertension: Secondary | ICD-10-CM | POA: Diagnosis not present

## 2017-12-12 DIAGNOSIS — E785 Hyperlipidemia, unspecified: Secondary | ICD-10-CM | POA: Diagnosis not present

## 2017-12-12 DIAGNOSIS — E039 Hypothyroidism, unspecified: Secondary | ICD-10-CM | POA: Diagnosis not present

## 2017-12-13 DIAGNOSIS — I10 Essential (primary) hypertension: Secondary | ICD-10-CM | POA: Diagnosis not present

## 2017-12-13 DIAGNOSIS — G309 Alzheimer's disease, unspecified: Secondary | ICD-10-CM | POA: Diagnosis not present

## 2017-12-13 DIAGNOSIS — E785 Hyperlipidemia, unspecified: Secondary | ICD-10-CM | POA: Diagnosis not present

## 2017-12-13 DIAGNOSIS — E039 Hypothyroidism, unspecified: Secondary | ICD-10-CM | POA: Diagnosis not present

## 2017-12-13 DIAGNOSIS — M1990 Unspecified osteoarthritis, unspecified site: Secondary | ICD-10-CM | POA: Diagnosis not present

## 2017-12-13 DIAGNOSIS — F329 Major depressive disorder, single episode, unspecified: Secondary | ICD-10-CM | POA: Diagnosis not present

## 2017-12-17 DIAGNOSIS — E039 Hypothyroidism, unspecified: Secondary | ICD-10-CM | POA: Diagnosis not present

## 2017-12-17 DIAGNOSIS — F329 Major depressive disorder, single episode, unspecified: Secondary | ICD-10-CM | POA: Diagnosis not present

## 2017-12-17 DIAGNOSIS — E785 Hyperlipidemia, unspecified: Secondary | ICD-10-CM | POA: Diagnosis not present

## 2017-12-17 DIAGNOSIS — I10 Essential (primary) hypertension: Secondary | ICD-10-CM | POA: Diagnosis not present

## 2017-12-17 DIAGNOSIS — M1990 Unspecified osteoarthritis, unspecified site: Secondary | ICD-10-CM | POA: Diagnosis not present

## 2017-12-17 DIAGNOSIS — G309 Alzheimer's disease, unspecified: Secondary | ICD-10-CM | POA: Diagnosis not present

## 2017-12-18 DIAGNOSIS — I10 Essential (primary) hypertension: Secondary | ICD-10-CM | POA: Diagnosis not present

## 2017-12-18 DIAGNOSIS — E039 Hypothyroidism, unspecified: Secondary | ICD-10-CM | POA: Diagnosis not present

## 2017-12-18 DIAGNOSIS — G309 Alzheimer's disease, unspecified: Secondary | ICD-10-CM | POA: Diagnosis not present

## 2017-12-18 DIAGNOSIS — M1990 Unspecified osteoarthritis, unspecified site: Secondary | ICD-10-CM | POA: Diagnosis not present

## 2017-12-18 DIAGNOSIS — E785 Hyperlipidemia, unspecified: Secondary | ICD-10-CM | POA: Diagnosis not present

## 2017-12-18 DIAGNOSIS — F329 Major depressive disorder, single episode, unspecified: Secondary | ICD-10-CM | POA: Diagnosis not present

## 2017-12-19 DIAGNOSIS — E785 Hyperlipidemia, unspecified: Secondary | ICD-10-CM | POA: Diagnosis not present

## 2017-12-19 DIAGNOSIS — M1990 Unspecified osteoarthritis, unspecified site: Secondary | ICD-10-CM | POA: Diagnosis not present

## 2017-12-19 DIAGNOSIS — F329 Major depressive disorder, single episode, unspecified: Secondary | ICD-10-CM | POA: Diagnosis not present

## 2017-12-19 DIAGNOSIS — E039 Hypothyroidism, unspecified: Secondary | ICD-10-CM | POA: Diagnosis not present

## 2017-12-19 DIAGNOSIS — I10 Essential (primary) hypertension: Secondary | ICD-10-CM | POA: Diagnosis not present

## 2017-12-19 DIAGNOSIS — G309 Alzheimer's disease, unspecified: Secondary | ICD-10-CM | POA: Diagnosis not present

## 2017-12-20 DIAGNOSIS — E785 Hyperlipidemia, unspecified: Secondary | ICD-10-CM | POA: Diagnosis not present

## 2017-12-20 DIAGNOSIS — G309 Alzheimer's disease, unspecified: Secondary | ICD-10-CM | POA: Diagnosis not present

## 2017-12-20 DIAGNOSIS — I10 Essential (primary) hypertension: Secondary | ICD-10-CM | POA: Diagnosis not present

## 2017-12-20 DIAGNOSIS — E039 Hypothyroidism, unspecified: Secondary | ICD-10-CM | POA: Diagnosis not present

## 2017-12-20 DIAGNOSIS — M1990 Unspecified osteoarthritis, unspecified site: Secondary | ICD-10-CM | POA: Diagnosis not present

## 2017-12-20 DIAGNOSIS — F329 Major depressive disorder, single episode, unspecified: Secondary | ICD-10-CM | POA: Diagnosis not present

## 2017-12-24 DIAGNOSIS — E785 Hyperlipidemia, unspecified: Secondary | ICD-10-CM | POA: Diagnosis not present

## 2017-12-24 DIAGNOSIS — I10 Essential (primary) hypertension: Secondary | ICD-10-CM | POA: Diagnosis not present

## 2017-12-24 DIAGNOSIS — F329 Major depressive disorder, single episode, unspecified: Secondary | ICD-10-CM | POA: Diagnosis not present

## 2017-12-24 DIAGNOSIS — G309 Alzheimer's disease, unspecified: Secondary | ICD-10-CM | POA: Diagnosis not present

## 2017-12-24 DIAGNOSIS — M1990 Unspecified osteoarthritis, unspecified site: Secondary | ICD-10-CM | POA: Diagnosis not present

## 2017-12-24 DIAGNOSIS — E039 Hypothyroidism, unspecified: Secondary | ICD-10-CM | POA: Diagnosis not present

## 2017-12-25 DIAGNOSIS — E785 Hyperlipidemia, unspecified: Secondary | ICD-10-CM | POA: Diagnosis not present

## 2017-12-25 DIAGNOSIS — E039 Hypothyroidism, unspecified: Secondary | ICD-10-CM | POA: Diagnosis not present

## 2017-12-25 DIAGNOSIS — I10 Essential (primary) hypertension: Secondary | ICD-10-CM | POA: Diagnosis not present

## 2017-12-25 DIAGNOSIS — F329 Major depressive disorder, single episode, unspecified: Secondary | ICD-10-CM | POA: Diagnosis not present

## 2017-12-25 DIAGNOSIS — G309 Alzheimer's disease, unspecified: Secondary | ICD-10-CM | POA: Diagnosis not present

## 2017-12-25 DIAGNOSIS — M1990 Unspecified osteoarthritis, unspecified site: Secondary | ICD-10-CM | POA: Diagnosis not present

## 2017-12-26 DIAGNOSIS — M1990 Unspecified osteoarthritis, unspecified site: Secondary | ICD-10-CM | POA: Diagnosis not present

## 2017-12-26 DIAGNOSIS — E039 Hypothyroidism, unspecified: Secondary | ICD-10-CM | POA: Diagnosis not present

## 2017-12-26 DIAGNOSIS — F329 Major depressive disorder, single episode, unspecified: Secondary | ICD-10-CM | POA: Diagnosis not present

## 2017-12-26 DIAGNOSIS — E785 Hyperlipidemia, unspecified: Secondary | ICD-10-CM | POA: Diagnosis not present

## 2017-12-26 DIAGNOSIS — G309 Alzheimer's disease, unspecified: Secondary | ICD-10-CM | POA: Diagnosis not present

## 2017-12-26 DIAGNOSIS — I10 Essential (primary) hypertension: Secondary | ICD-10-CM | POA: Diagnosis not present

## 2017-12-27 DIAGNOSIS — G309 Alzheimer's disease, unspecified: Secondary | ICD-10-CM | POA: Diagnosis not present

## 2017-12-27 DIAGNOSIS — E46 Unspecified protein-calorie malnutrition: Secondary | ICD-10-CM | POA: Diagnosis not present

## 2017-12-27 DIAGNOSIS — F329 Major depressive disorder, single episode, unspecified: Secondary | ICD-10-CM | POA: Diagnosis not present

## 2017-12-27 DIAGNOSIS — I639 Cerebral infarction, unspecified: Secondary | ICD-10-CM | POA: Diagnosis not present

## 2017-12-27 DIAGNOSIS — G250 Essential tremor: Secondary | ICD-10-CM | POA: Diagnosis not present

## 2017-12-27 DIAGNOSIS — R531 Weakness: Secondary | ICD-10-CM | POA: Diagnosis not present

## 2017-12-27 DIAGNOSIS — I10 Essential (primary) hypertension: Secondary | ICD-10-CM | POA: Diagnosis not present

## 2017-12-27 DIAGNOSIS — E785 Hyperlipidemia, unspecified: Secondary | ICD-10-CM | POA: Diagnosis not present

## 2017-12-27 DIAGNOSIS — E039 Hypothyroidism, unspecified: Secondary | ICD-10-CM | POA: Diagnosis not present

## 2017-12-27 DIAGNOSIS — M1990 Unspecified osteoarthritis, unspecified site: Secondary | ICD-10-CM | POA: Diagnosis not present

## 2017-12-31 DIAGNOSIS — F329 Major depressive disorder, single episode, unspecified: Secondary | ICD-10-CM | POA: Diagnosis not present

## 2017-12-31 DIAGNOSIS — G309 Alzheimer's disease, unspecified: Secondary | ICD-10-CM | POA: Diagnosis not present

## 2017-12-31 DIAGNOSIS — M1990 Unspecified osteoarthritis, unspecified site: Secondary | ICD-10-CM | POA: Diagnosis not present

## 2017-12-31 DIAGNOSIS — E785 Hyperlipidemia, unspecified: Secondary | ICD-10-CM | POA: Diagnosis not present

## 2017-12-31 DIAGNOSIS — E039 Hypothyroidism, unspecified: Secondary | ICD-10-CM | POA: Diagnosis not present

## 2017-12-31 DIAGNOSIS — I10 Essential (primary) hypertension: Secondary | ICD-10-CM | POA: Diagnosis not present

## 2018-01-01 DIAGNOSIS — I10 Essential (primary) hypertension: Secondary | ICD-10-CM | POA: Diagnosis not present

## 2018-01-01 DIAGNOSIS — M1990 Unspecified osteoarthritis, unspecified site: Secondary | ICD-10-CM | POA: Diagnosis not present

## 2018-01-01 DIAGNOSIS — G309 Alzheimer's disease, unspecified: Secondary | ICD-10-CM | POA: Diagnosis not present

## 2018-01-01 DIAGNOSIS — E039 Hypothyroidism, unspecified: Secondary | ICD-10-CM | POA: Diagnosis not present

## 2018-01-01 DIAGNOSIS — E785 Hyperlipidemia, unspecified: Secondary | ICD-10-CM | POA: Diagnosis not present

## 2018-01-01 DIAGNOSIS — F329 Major depressive disorder, single episode, unspecified: Secondary | ICD-10-CM | POA: Diagnosis not present

## 2018-01-02 DIAGNOSIS — M1990 Unspecified osteoarthritis, unspecified site: Secondary | ICD-10-CM | POA: Diagnosis not present

## 2018-01-02 DIAGNOSIS — G309 Alzheimer's disease, unspecified: Secondary | ICD-10-CM | POA: Diagnosis not present

## 2018-01-02 DIAGNOSIS — E039 Hypothyroidism, unspecified: Secondary | ICD-10-CM | POA: Diagnosis not present

## 2018-01-02 DIAGNOSIS — I10 Essential (primary) hypertension: Secondary | ICD-10-CM | POA: Diagnosis not present

## 2018-01-02 DIAGNOSIS — E785 Hyperlipidemia, unspecified: Secondary | ICD-10-CM | POA: Diagnosis not present

## 2018-01-02 DIAGNOSIS — F329 Major depressive disorder, single episode, unspecified: Secondary | ICD-10-CM | POA: Diagnosis not present

## 2018-01-03 DIAGNOSIS — E039 Hypothyroidism, unspecified: Secondary | ICD-10-CM | POA: Diagnosis not present

## 2018-01-03 DIAGNOSIS — E785 Hyperlipidemia, unspecified: Secondary | ICD-10-CM | POA: Diagnosis not present

## 2018-01-03 DIAGNOSIS — I10 Essential (primary) hypertension: Secondary | ICD-10-CM | POA: Diagnosis not present

## 2018-01-03 DIAGNOSIS — G309 Alzheimer's disease, unspecified: Secondary | ICD-10-CM | POA: Diagnosis not present

## 2018-01-03 DIAGNOSIS — F329 Major depressive disorder, single episode, unspecified: Secondary | ICD-10-CM | POA: Diagnosis not present

## 2018-01-03 DIAGNOSIS — M1990 Unspecified osteoarthritis, unspecified site: Secondary | ICD-10-CM | POA: Diagnosis not present

## 2018-01-07 DIAGNOSIS — G309 Alzheimer's disease, unspecified: Secondary | ICD-10-CM | POA: Diagnosis not present

## 2018-01-07 DIAGNOSIS — F329 Major depressive disorder, single episode, unspecified: Secondary | ICD-10-CM | POA: Diagnosis not present

## 2018-01-07 DIAGNOSIS — E039 Hypothyroidism, unspecified: Secondary | ICD-10-CM | POA: Diagnosis not present

## 2018-01-07 DIAGNOSIS — I10 Essential (primary) hypertension: Secondary | ICD-10-CM | POA: Diagnosis not present

## 2018-01-07 DIAGNOSIS — E785 Hyperlipidemia, unspecified: Secondary | ICD-10-CM | POA: Diagnosis not present

## 2018-01-07 DIAGNOSIS — M1990 Unspecified osteoarthritis, unspecified site: Secondary | ICD-10-CM | POA: Diagnosis not present

## 2018-01-08 DIAGNOSIS — I10 Essential (primary) hypertension: Secondary | ICD-10-CM | POA: Diagnosis not present

## 2018-01-08 DIAGNOSIS — G309 Alzheimer's disease, unspecified: Secondary | ICD-10-CM | POA: Diagnosis not present

## 2018-01-08 DIAGNOSIS — E785 Hyperlipidemia, unspecified: Secondary | ICD-10-CM | POA: Diagnosis not present

## 2018-01-08 DIAGNOSIS — F329 Major depressive disorder, single episode, unspecified: Secondary | ICD-10-CM | POA: Diagnosis not present

## 2018-01-08 DIAGNOSIS — E039 Hypothyroidism, unspecified: Secondary | ICD-10-CM | POA: Diagnosis not present

## 2018-01-08 DIAGNOSIS — M1990 Unspecified osteoarthritis, unspecified site: Secondary | ICD-10-CM | POA: Diagnosis not present

## 2018-01-09 DIAGNOSIS — E039 Hypothyroidism, unspecified: Secondary | ICD-10-CM | POA: Diagnosis not present

## 2018-01-09 DIAGNOSIS — I10 Essential (primary) hypertension: Secondary | ICD-10-CM | POA: Diagnosis not present

## 2018-01-09 DIAGNOSIS — E785 Hyperlipidemia, unspecified: Secondary | ICD-10-CM | POA: Diagnosis not present

## 2018-01-09 DIAGNOSIS — M1990 Unspecified osteoarthritis, unspecified site: Secondary | ICD-10-CM | POA: Diagnosis not present

## 2018-01-09 DIAGNOSIS — F329 Major depressive disorder, single episode, unspecified: Secondary | ICD-10-CM | POA: Diagnosis not present

## 2018-01-09 DIAGNOSIS — G309 Alzheimer's disease, unspecified: Secondary | ICD-10-CM | POA: Diagnosis not present

## 2018-01-10 DIAGNOSIS — M1990 Unspecified osteoarthritis, unspecified site: Secondary | ICD-10-CM | POA: Diagnosis not present

## 2018-01-10 DIAGNOSIS — F329 Major depressive disorder, single episode, unspecified: Secondary | ICD-10-CM | POA: Diagnosis not present

## 2018-01-10 DIAGNOSIS — I10 Essential (primary) hypertension: Secondary | ICD-10-CM | POA: Diagnosis not present

## 2018-01-10 DIAGNOSIS — E039 Hypothyroidism, unspecified: Secondary | ICD-10-CM | POA: Diagnosis not present

## 2018-01-10 DIAGNOSIS — G309 Alzheimer's disease, unspecified: Secondary | ICD-10-CM | POA: Diagnosis not present

## 2018-01-10 DIAGNOSIS — E785 Hyperlipidemia, unspecified: Secondary | ICD-10-CM | POA: Diagnosis not present

## 2018-01-14 DIAGNOSIS — E039 Hypothyroidism, unspecified: Secondary | ICD-10-CM | POA: Diagnosis not present

## 2018-01-14 DIAGNOSIS — G309 Alzheimer's disease, unspecified: Secondary | ICD-10-CM | POA: Diagnosis not present

## 2018-01-14 DIAGNOSIS — M1990 Unspecified osteoarthritis, unspecified site: Secondary | ICD-10-CM | POA: Diagnosis not present

## 2018-01-14 DIAGNOSIS — E785 Hyperlipidemia, unspecified: Secondary | ICD-10-CM | POA: Diagnosis not present

## 2018-01-14 DIAGNOSIS — I10 Essential (primary) hypertension: Secondary | ICD-10-CM | POA: Diagnosis not present

## 2018-01-14 DIAGNOSIS — F329 Major depressive disorder, single episode, unspecified: Secondary | ICD-10-CM | POA: Diagnosis not present

## 2018-01-15 DIAGNOSIS — G309 Alzheimer's disease, unspecified: Secondary | ICD-10-CM | POA: Diagnosis not present

## 2018-01-15 DIAGNOSIS — E039 Hypothyroidism, unspecified: Secondary | ICD-10-CM | POA: Diagnosis not present

## 2018-01-15 DIAGNOSIS — M1990 Unspecified osteoarthritis, unspecified site: Secondary | ICD-10-CM | POA: Diagnosis not present

## 2018-01-15 DIAGNOSIS — E785 Hyperlipidemia, unspecified: Secondary | ICD-10-CM | POA: Diagnosis not present

## 2018-01-15 DIAGNOSIS — I10 Essential (primary) hypertension: Secondary | ICD-10-CM | POA: Diagnosis not present

## 2018-01-15 DIAGNOSIS — F329 Major depressive disorder, single episode, unspecified: Secondary | ICD-10-CM | POA: Diagnosis not present

## 2018-01-16 DIAGNOSIS — G309 Alzheimer's disease, unspecified: Secondary | ICD-10-CM | POA: Diagnosis not present

## 2018-01-16 DIAGNOSIS — F329 Major depressive disorder, single episode, unspecified: Secondary | ICD-10-CM | POA: Diagnosis not present

## 2018-01-16 DIAGNOSIS — E039 Hypothyroidism, unspecified: Secondary | ICD-10-CM | POA: Diagnosis not present

## 2018-01-16 DIAGNOSIS — M1990 Unspecified osteoarthritis, unspecified site: Secondary | ICD-10-CM | POA: Diagnosis not present

## 2018-01-16 DIAGNOSIS — I10 Essential (primary) hypertension: Secondary | ICD-10-CM | POA: Diagnosis not present

## 2018-01-16 DIAGNOSIS — E785 Hyperlipidemia, unspecified: Secondary | ICD-10-CM | POA: Diagnosis not present

## 2018-01-17 DIAGNOSIS — M1990 Unspecified osteoarthritis, unspecified site: Secondary | ICD-10-CM | POA: Diagnosis not present

## 2018-01-17 DIAGNOSIS — F329 Major depressive disorder, single episode, unspecified: Secondary | ICD-10-CM | POA: Diagnosis not present

## 2018-01-17 DIAGNOSIS — E785 Hyperlipidemia, unspecified: Secondary | ICD-10-CM | POA: Diagnosis not present

## 2018-01-17 DIAGNOSIS — E039 Hypothyroidism, unspecified: Secondary | ICD-10-CM | POA: Diagnosis not present

## 2018-01-17 DIAGNOSIS — G309 Alzheimer's disease, unspecified: Secondary | ICD-10-CM | POA: Diagnosis not present

## 2018-01-17 DIAGNOSIS — I10 Essential (primary) hypertension: Secondary | ICD-10-CM | POA: Diagnosis not present

## 2018-01-21 DIAGNOSIS — Z8673 Personal history of transient ischemic attack (TIA), and cerebral infarction without residual deficits: Secondary | ICD-10-CM | POA: Diagnosis not present

## 2018-01-21 DIAGNOSIS — G309 Alzheimer's disease, unspecified: Secondary | ICD-10-CM | POA: Diagnosis not present

## 2018-01-21 DIAGNOSIS — R531 Weakness: Secondary | ICD-10-CM | POA: Diagnosis not present

## 2018-01-21 DIAGNOSIS — F0280 Dementia in other diseases classified elsewhere without behavioral disturbance: Secondary | ICD-10-CM | POA: Diagnosis not present

## 2018-01-21 DIAGNOSIS — E038 Other specified hypothyroidism: Secondary | ICD-10-CM | POA: Diagnosis not present

## 2018-01-21 DIAGNOSIS — Z79899 Other long term (current) drug therapy: Secondary | ICD-10-CM | POA: Diagnosis not present

## 2018-01-21 DIAGNOSIS — E039 Hypothyroidism, unspecified: Secondary | ICD-10-CM | POA: Diagnosis not present

## 2018-01-21 DIAGNOSIS — M1990 Unspecified osteoarthritis, unspecified site: Secondary | ICD-10-CM | POA: Diagnosis not present

## 2018-01-21 DIAGNOSIS — E785 Hyperlipidemia, unspecified: Secondary | ICD-10-CM | POA: Diagnosis not present

## 2018-01-21 DIAGNOSIS — F329 Major depressive disorder, single episode, unspecified: Secondary | ICD-10-CM | POA: Diagnosis not present

## 2018-01-21 DIAGNOSIS — F3289 Other specified depressive episodes: Secondary | ICD-10-CM | POA: Diagnosis not present

## 2018-01-21 DIAGNOSIS — G20 Parkinson's disease: Secondary | ICD-10-CM | POA: Diagnosis not present

## 2018-01-21 DIAGNOSIS — I10 Essential (primary) hypertension: Secondary | ICD-10-CM | POA: Diagnosis not present

## 2018-01-21 DIAGNOSIS — R627 Adult failure to thrive: Secondary | ICD-10-CM | POA: Diagnosis not present

## 2018-01-21 DIAGNOSIS — Z86711 Personal history of pulmonary embolism: Secondary | ICD-10-CM | POA: Diagnosis not present

## 2018-01-31 DIAGNOSIS — R531 Weakness: Secondary | ICD-10-CM | POA: Diagnosis not present

## 2018-01-31 DIAGNOSIS — G309 Alzheimer's disease, unspecified: Secondary | ICD-10-CM | POA: Diagnosis not present

## 2018-01-31 DIAGNOSIS — Z8673 Personal history of transient ischemic attack (TIA), and cerebral infarction without residual deficits: Secondary | ICD-10-CM | POA: Diagnosis not present

## 2018-01-31 DIAGNOSIS — R627 Adult failure to thrive: Secondary | ICD-10-CM | POA: Diagnosis not present

## 2018-01-31 DIAGNOSIS — F0280 Dementia in other diseases classified elsewhere without behavioral disturbance: Secondary | ICD-10-CM | POA: Diagnosis not present

## 2018-01-31 DIAGNOSIS — G20 Parkinson's disease: Secondary | ICD-10-CM | POA: Diagnosis not present

## 2018-02-03 DIAGNOSIS — G20 Parkinson's disease: Secondary | ICD-10-CM | POA: Diagnosis not present

## 2018-02-03 DIAGNOSIS — R531 Weakness: Secondary | ICD-10-CM | POA: Diagnosis not present

## 2018-02-03 DIAGNOSIS — R627 Adult failure to thrive: Secondary | ICD-10-CM | POA: Diagnosis not present

## 2018-02-03 DIAGNOSIS — Z8673 Personal history of transient ischemic attack (TIA), and cerebral infarction without residual deficits: Secondary | ICD-10-CM | POA: Diagnosis not present

## 2018-02-03 DIAGNOSIS — G309 Alzheimer's disease, unspecified: Secondary | ICD-10-CM | POA: Diagnosis not present

## 2018-02-03 DIAGNOSIS — F0280 Dementia in other diseases classified elsewhere without behavioral disturbance: Secondary | ICD-10-CM | POA: Diagnosis not present

## 2018-02-05 DIAGNOSIS — F0280 Dementia in other diseases classified elsewhere without behavioral disturbance: Secondary | ICD-10-CM | POA: Diagnosis not present

## 2018-02-05 DIAGNOSIS — G309 Alzheimer's disease, unspecified: Secondary | ICD-10-CM | POA: Diagnosis not present

## 2018-02-05 DIAGNOSIS — G20 Parkinson's disease: Secondary | ICD-10-CM | POA: Diagnosis not present

## 2018-02-05 DIAGNOSIS — R531 Weakness: Secondary | ICD-10-CM | POA: Diagnosis not present

## 2018-02-05 DIAGNOSIS — Z8673 Personal history of transient ischemic attack (TIA), and cerebral infarction without residual deficits: Secondary | ICD-10-CM | POA: Diagnosis not present

## 2018-02-05 DIAGNOSIS — R627 Adult failure to thrive: Secondary | ICD-10-CM | POA: Diagnosis not present

## 2018-02-06 DIAGNOSIS — R627 Adult failure to thrive: Secondary | ICD-10-CM | POA: Diagnosis not present

## 2018-02-06 DIAGNOSIS — G20 Parkinson's disease: Secondary | ICD-10-CM | POA: Diagnosis not present

## 2018-02-06 DIAGNOSIS — F0280 Dementia in other diseases classified elsewhere without behavioral disturbance: Secondary | ICD-10-CM | POA: Diagnosis not present

## 2018-02-06 DIAGNOSIS — Z8673 Personal history of transient ischemic attack (TIA), and cerebral infarction without residual deficits: Secondary | ICD-10-CM | POA: Diagnosis not present

## 2018-02-06 DIAGNOSIS — G309 Alzheimer's disease, unspecified: Secondary | ICD-10-CM | POA: Diagnosis not present

## 2018-02-06 DIAGNOSIS — R531 Weakness: Secondary | ICD-10-CM | POA: Diagnosis not present

## 2018-02-10 DIAGNOSIS — Z8673 Personal history of transient ischemic attack (TIA), and cerebral infarction without residual deficits: Secondary | ICD-10-CM | POA: Diagnosis not present

## 2018-02-10 DIAGNOSIS — G309 Alzheimer's disease, unspecified: Secondary | ICD-10-CM | POA: Diagnosis not present

## 2018-02-10 DIAGNOSIS — G20 Parkinson's disease: Secondary | ICD-10-CM | POA: Diagnosis not present

## 2018-02-10 DIAGNOSIS — R627 Adult failure to thrive: Secondary | ICD-10-CM | POA: Diagnosis not present

## 2018-02-10 DIAGNOSIS — F0280 Dementia in other diseases classified elsewhere without behavioral disturbance: Secondary | ICD-10-CM | POA: Diagnosis not present

## 2018-02-10 DIAGNOSIS — R531 Weakness: Secondary | ICD-10-CM | POA: Diagnosis not present

## 2018-02-11 DIAGNOSIS — G20 Parkinson's disease: Secondary | ICD-10-CM | POA: Diagnosis not present

## 2018-02-11 DIAGNOSIS — G309 Alzheimer's disease, unspecified: Secondary | ICD-10-CM | POA: Diagnosis not present

## 2018-02-11 DIAGNOSIS — R627 Adult failure to thrive: Secondary | ICD-10-CM | POA: Diagnosis not present

## 2018-02-11 DIAGNOSIS — F0280 Dementia in other diseases classified elsewhere without behavioral disturbance: Secondary | ICD-10-CM | POA: Diagnosis not present

## 2018-02-11 DIAGNOSIS — R531 Weakness: Secondary | ICD-10-CM | POA: Diagnosis not present

## 2018-02-11 DIAGNOSIS — Z8673 Personal history of transient ischemic attack (TIA), and cerebral infarction without residual deficits: Secondary | ICD-10-CM | POA: Diagnosis not present

## 2018-02-12 DIAGNOSIS — Z8673 Personal history of transient ischemic attack (TIA), and cerebral infarction without residual deficits: Secondary | ICD-10-CM | POA: Diagnosis not present

## 2018-02-12 DIAGNOSIS — R627 Adult failure to thrive: Secondary | ICD-10-CM | POA: Diagnosis not present

## 2018-02-12 DIAGNOSIS — R531 Weakness: Secondary | ICD-10-CM | POA: Diagnosis not present

## 2018-02-12 DIAGNOSIS — G309 Alzheimer's disease, unspecified: Secondary | ICD-10-CM | POA: Diagnosis not present

## 2018-02-12 DIAGNOSIS — G20 Parkinson's disease: Secondary | ICD-10-CM | POA: Diagnosis not present

## 2018-02-12 DIAGNOSIS — F0280 Dementia in other diseases classified elsewhere without behavioral disturbance: Secondary | ICD-10-CM | POA: Diagnosis not present

## 2018-02-17 DIAGNOSIS — R627 Adult failure to thrive: Secondary | ICD-10-CM | POA: Diagnosis not present

## 2018-02-17 DIAGNOSIS — R531 Weakness: Secondary | ICD-10-CM | POA: Diagnosis not present

## 2018-02-17 DIAGNOSIS — F0280 Dementia in other diseases classified elsewhere without behavioral disturbance: Secondary | ICD-10-CM | POA: Diagnosis not present

## 2018-02-17 DIAGNOSIS — Z8673 Personal history of transient ischemic attack (TIA), and cerebral infarction without residual deficits: Secondary | ICD-10-CM | POA: Diagnosis not present

## 2018-02-17 DIAGNOSIS — G20 Parkinson's disease: Secondary | ICD-10-CM | POA: Diagnosis not present

## 2018-02-17 DIAGNOSIS — G309 Alzheimer's disease, unspecified: Secondary | ICD-10-CM | POA: Diagnosis not present

## 2018-02-19 DIAGNOSIS — G309 Alzheimer's disease, unspecified: Secondary | ICD-10-CM | POA: Diagnosis not present

## 2018-02-19 DIAGNOSIS — R531 Weakness: Secondary | ICD-10-CM | POA: Diagnosis not present

## 2018-02-19 DIAGNOSIS — R627 Adult failure to thrive: Secondary | ICD-10-CM | POA: Diagnosis not present

## 2018-02-19 DIAGNOSIS — F0280 Dementia in other diseases classified elsewhere without behavioral disturbance: Secondary | ICD-10-CM | POA: Diagnosis not present

## 2018-02-19 DIAGNOSIS — Z8673 Personal history of transient ischemic attack (TIA), and cerebral infarction without residual deficits: Secondary | ICD-10-CM | POA: Diagnosis not present

## 2018-02-19 DIAGNOSIS — G20 Parkinson's disease: Secondary | ICD-10-CM | POA: Diagnosis not present

## 2018-02-20 DIAGNOSIS — G309 Alzheimer's disease, unspecified: Secondary | ICD-10-CM | POA: Diagnosis not present

## 2018-02-20 DIAGNOSIS — R627 Adult failure to thrive: Secondary | ICD-10-CM | POA: Diagnosis not present

## 2018-02-20 DIAGNOSIS — R531 Weakness: Secondary | ICD-10-CM | POA: Diagnosis not present

## 2018-02-20 DIAGNOSIS — Z8673 Personal history of transient ischemic attack (TIA), and cerebral infarction without residual deficits: Secondary | ICD-10-CM | POA: Diagnosis not present

## 2018-02-20 DIAGNOSIS — G20 Parkinson's disease: Secondary | ICD-10-CM | POA: Diagnosis not present

## 2018-02-20 DIAGNOSIS — F0280 Dementia in other diseases classified elsewhere without behavioral disturbance: Secondary | ICD-10-CM | POA: Diagnosis not present

## 2018-02-21 DIAGNOSIS — R627 Adult failure to thrive: Secondary | ICD-10-CM | POA: Diagnosis not present

## 2018-02-21 DIAGNOSIS — F0280 Dementia in other diseases classified elsewhere without behavioral disturbance: Secondary | ICD-10-CM | POA: Diagnosis not present

## 2018-02-21 DIAGNOSIS — G20 Parkinson's disease: Secondary | ICD-10-CM | POA: Diagnosis not present

## 2018-02-21 DIAGNOSIS — R531 Weakness: Secondary | ICD-10-CM | POA: Diagnosis not present

## 2018-02-21 DIAGNOSIS — Z8673 Personal history of transient ischemic attack (TIA), and cerebral infarction without residual deficits: Secondary | ICD-10-CM | POA: Diagnosis not present

## 2018-02-21 DIAGNOSIS — G309 Alzheimer's disease, unspecified: Secondary | ICD-10-CM | POA: Diagnosis not present

## 2018-02-24 DIAGNOSIS — R627 Adult failure to thrive: Secondary | ICD-10-CM | POA: Diagnosis not present

## 2018-02-24 DIAGNOSIS — F0280 Dementia in other diseases classified elsewhere without behavioral disturbance: Secondary | ICD-10-CM | POA: Diagnosis not present

## 2018-02-24 DIAGNOSIS — R531 Weakness: Secondary | ICD-10-CM | POA: Diagnosis not present

## 2018-02-24 DIAGNOSIS — G20 Parkinson's disease: Secondary | ICD-10-CM | POA: Diagnosis not present

## 2018-02-24 DIAGNOSIS — Z8673 Personal history of transient ischemic attack (TIA), and cerebral infarction without residual deficits: Secondary | ICD-10-CM | POA: Diagnosis not present

## 2018-02-24 DIAGNOSIS — G309 Alzheimer's disease, unspecified: Secondary | ICD-10-CM | POA: Diagnosis not present

## 2018-02-25 DIAGNOSIS — G20 Parkinson's disease: Secondary | ICD-10-CM | POA: Diagnosis not present

## 2018-02-25 DIAGNOSIS — Z8673 Personal history of transient ischemic attack (TIA), and cerebral infarction without residual deficits: Secondary | ICD-10-CM | POA: Diagnosis not present

## 2018-02-25 DIAGNOSIS — F0280 Dementia in other diseases classified elsewhere without behavioral disturbance: Secondary | ICD-10-CM | POA: Diagnosis not present

## 2018-02-25 DIAGNOSIS — R627 Adult failure to thrive: Secondary | ICD-10-CM | POA: Diagnosis not present

## 2018-02-25 DIAGNOSIS — R531 Weakness: Secondary | ICD-10-CM | POA: Diagnosis not present

## 2018-02-25 DIAGNOSIS — G309 Alzheimer's disease, unspecified: Secondary | ICD-10-CM | POA: Diagnosis not present

## 2018-02-26 DIAGNOSIS — R627 Adult failure to thrive: Secondary | ICD-10-CM | POA: Diagnosis not present

## 2018-02-26 DIAGNOSIS — F0280 Dementia in other diseases classified elsewhere without behavioral disturbance: Secondary | ICD-10-CM | POA: Diagnosis not present

## 2018-02-26 DIAGNOSIS — Z8673 Personal history of transient ischemic attack (TIA), and cerebral infarction without residual deficits: Secondary | ICD-10-CM | POA: Diagnosis not present

## 2018-02-26 DIAGNOSIS — G309 Alzheimer's disease, unspecified: Secondary | ICD-10-CM | POA: Diagnosis not present

## 2018-02-26 DIAGNOSIS — R531 Weakness: Secondary | ICD-10-CM | POA: Diagnosis not present

## 2018-02-26 DIAGNOSIS — G20 Parkinson's disease: Secondary | ICD-10-CM | POA: Diagnosis not present

## 2018-03-03 DIAGNOSIS — R531 Weakness: Secondary | ICD-10-CM | POA: Diagnosis not present

## 2018-03-03 DIAGNOSIS — G309 Alzheimer's disease, unspecified: Secondary | ICD-10-CM | POA: Diagnosis not present

## 2018-03-03 DIAGNOSIS — Z8673 Personal history of transient ischemic attack (TIA), and cerebral infarction without residual deficits: Secondary | ICD-10-CM | POA: Diagnosis not present

## 2018-03-03 DIAGNOSIS — G20 Parkinson's disease: Secondary | ICD-10-CM | POA: Diagnosis not present

## 2018-03-03 DIAGNOSIS — F0280 Dementia in other diseases classified elsewhere without behavioral disturbance: Secondary | ICD-10-CM | POA: Diagnosis not present

## 2018-03-03 DIAGNOSIS — R627 Adult failure to thrive: Secondary | ICD-10-CM | POA: Diagnosis not present

## 2018-03-04 DIAGNOSIS — R627 Adult failure to thrive: Secondary | ICD-10-CM | POA: Diagnosis not present

## 2018-03-04 DIAGNOSIS — G309 Alzheimer's disease, unspecified: Secondary | ICD-10-CM | POA: Diagnosis not present

## 2018-03-04 DIAGNOSIS — G20 Parkinson's disease: Secondary | ICD-10-CM | POA: Diagnosis not present

## 2018-03-04 DIAGNOSIS — F0280 Dementia in other diseases classified elsewhere without behavioral disturbance: Secondary | ICD-10-CM | POA: Diagnosis not present

## 2018-03-04 DIAGNOSIS — R531 Weakness: Secondary | ICD-10-CM | POA: Diagnosis not present

## 2018-03-04 DIAGNOSIS — Z8673 Personal history of transient ischemic attack (TIA), and cerebral infarction without residual deficits: Secondary | ICD-10-CM | POA: Diagnosis not present

## 2018-03-05 DIAGNOSIS — G20 Parkinson's disease: Secondary | ICD-10-CM | POA: Diagnosis not present

## 2018-03-05 DIAGNOSIS — G309 Alzheimer's disease, unspecified: Secondary | ICD-10-CM | POA: Diagnosis not present

## 2018-03-05 DIAGNOSIS — R627 Adult failure to thrive: Secondary | ICD-10-CM | POA: Diagnosis not present

## 2018-03-05 DIAGNOSIS — R531 Weakness: Secondary | ICD-10-CM | POA: Diagnosis not present

## 2018-03-05 DIAGNOSIS — Z8673 Personal history of transient ischemic attack (TIA), and cerebral infarction without residual deficits: Secondary | ICD-10-CM | POA: Diagnosis not present

## 2018-03-05 DIAGNOSIS — F0280 Dementia in other diseases classified elsewhere without behavioral disturbance: Secondary | ICD-10-CM | POA: Diagnosis not present

## 2018-03-06 DIAGNOSIS — R627 Adult failure to thrive: Secondary | ICD-10-CM | POA: Diagnosis not present

## 2018-03-06 DIAGNOSIS — G309 Alzheimer's disease, unspecified: Secondary | ICD-10-CM | POA: Diagnosis not present

## 2018-03-06 DIAGNOSIS — Z8673 Personal history of transient ischemic attack (TIA), and cerebral infarction without residual deficits: Secondary | ICD-10-CM | POA: Diagnosis not present

## 2018-03-06 DIAGNOSIS — F0280 Dementia in other diseases classified elsewhere without behavioral disturbance: Secondary | ICD-10-CM | POA: Diagnosis not present

## 2018-03-06 DIAGNOSIS — R531 Weakness: Secondary | ICD-10-CM | POA: Diagnosis not present

## 2018-03-06 DIAGNOSIS — G20 Parkinson's disease: Secondary | ICD-10-CM | POA: Diagnosis not present

## 2018-03-10 DIAGNOSIS — R531 Weakness: Secondary | ICD-10-CM | POA: Diagnosis not present

## 2018-03-10 DIAGNOSIS — G309 Alzheimer's disease, unspecified: Secondary | ICD-10-CM | POA: Diagnosis not present

## 2018-03-10 DIAGNOSIS — R627 Adult failure to thrive: Secondary | ICD-10-CM | POA: Diagnosis not present

## 2018-03-10 DIAGNOSIS — G20 Parkinson's disease: Secondary | ICD-10-CM | POA: Diagnosis not present

## 2018-03-10 DIAGNOSIS — Z8673 Personal history of transient ischemic attack (TIA), and cerebral infarction without residual deficits: Secondary | ICD-10-CM | POA: Diagnosis not present

## 2018-03-10 DIAGNOSIS — F0280 Dementia in other diseases classified elsewhere without behavioral disturbance: Secondary | ICD-10-CM | POA: Diagnosis not present

## 2018-03-11 DIAGNOSIS — F0280 Dementia in other diseases classified elsewhere without behavioral disturbance: Secondary | ICD-10-CM | POA: Diagnosis not present

## 2018-03-11 DIAGNOSIS — Z8673 Personal history of transient ischemic attack (TIA), and cerebral infarction without residual deficits: Secondary | ICD-10-CM | POA: Diagnosis not present

## 2018-03-11 DIAGNOSIS — G20 Parkinson's disease: Secondary | ICD-10-CM | POA: Diagnosis not present

## 2018-03-11 DIAGNOSIS — G309 Alzheimer's disease, unspecified: Secondary | ICD-10-CM | POA: Diagnosis not present

## 2018-03-11 DIAGNOSIS — R627 Adult failure to thrive: Secondary | ICD-10-CM | POA: Diagnosis not present

## 2018-03-11 DIAGNOSIS — R531 Weakness: Secondary | ICD-10-CM | POA: Diagnosis not present

## 2018-03-13 DIAGNOSIS — G309 Alzheimer's disease, unspecified: Secondary | ICD-10-CM | POA: Diagnosis not present

## 2018-03-13 DIAGNOSIS — G20 Parkinson's disease: Secondary | ICD-10-CM | POA: Diagnosis not present

## 2018-03-13 DIAGNOSIS — R627 Adult failure to thrive: Secondary | ICD-10-CM | POA: Diagnosis not present

## 2018-03-13 DIAGNOSIS — R531 Weakness: Secondary | ICD-10-CM | POA: Diagnosis not present

## 2018-03-13 DIAGNOSIS — F0280 Dementia in other diseases classified elsewhere without behavioral disturbance: Secondary | ICD-10-CM | POA: Diagnosis not present

## 2018-03-13 DIAGNOSIS — Z8673 Personal history of transient ischemic attack (TIA), and cerebral infarction without residual deficits: Secondary | ICD-10-CM | POA: Diagnosis not present

## 2018-03-14 DIAGNOSIS — R531 Weakness: Secondary | ICD-10-CM | POA: Diagnosis not present

## 2018-03-14 DIAGNOSIS — F0280 Dementia in other diseases classified elsewhere without behavioral disturbance: Secondary | ICD-10-CM | POA: Diagnosis not present

## 2018-03-14 DIAGNOSIS — G309 Alzheimer's disease, unspecified: Secondary | ICD-10-CM | POA: Diagnosis not present

## 2018-03-14 DIAGNOSIS — G20 Parkinson's disease: Secondary | ICD-10-CM | POA: Diagnosis not present

## 2018-03-14 DIAGNOSIS — Z8673 Personal history of transient ischemic attack (TIA), and cerebral infarction without residual deficits: Secondary | ICD-10-CM | POA: Diagnosis not present

## 2018-03-14 DIAGNOSIS — R627 Adult failure to thrive: Secondary | ICD-10-CM | POA: Diagnosis not present

## 2018-03-18 DIAGNOSIS — G309 Alzheimer's disease, unspecified: Secondary | ICD-10-CM | POA: Diagnosis not present

## 2018-03-18 DIAGNOSIS — R627 Adult failure to thrive: Secondary | ICD-10-CM | POA: Diagnosis not present

## 2018-03-18 DIAGNOSIS — G20 Parkinson's disease: Secondary | ICD-10-CM | POA: Diagnosis not present

## 2018-03-18 DIAGNOSIS — Z8673 Personal history of transient ischemic attack (TIA), and cerebral infarction without residual deficits: Secondary | ICD-10-CM | POA: Diagnosis not present

## 2018-03-18 DIAGNOSIS — F0280 Dementia in other diseases classified elsewhere without behavioral disturbance: Secondary | ICD-10-CM | POA: Diagnosis not present

## 2018-03-18 DIAGNOSIS — R531 Weakness: Secondary | ICD-10-CM | POA: Diagnosis not present

## 2018-03-20 DIAGNOSIS — R627 Adult failure to thrive: Secondary | ICD-10-CM | POA: Diagnosis not present

## 2018-03-20 DIAGNOSIS — F0280 Dementia in other diseases classified elsewhere without behavioral disturbance: Secondary | ICD-10-CM | POA: Diagnosis not present

## 2018-03-20 DIAGNOSIS — R531 Weakness: Secondary | ICD-10-CM | POA: Diagnosis not present

## 2018-03-20 DIAGNOSIS — G309 Alzheimer's disease, unspecified: Secondary | ICD-10-CM | POA: Diagnosis not present

## 2018-03-20 DIAGNOSIS — G20 Parkinson's disease: Secondary | ICD-10-CM | POA: Diagnosis not present

## 2018-03-20 DIAGNOSIS — Z8673 Personal history of transient ischemic attack (TIA), and cerebral infarction without residual deficits: Secondary | ICD-10-CM | POA: Diagnosis not present

## 2018-03-21 DIAGNOSIS — R531 Weakness: Secondary | ICD-10-CM | POA: Diagnosis not present

## 2018-03-21 DIAGNOSIS — G20 Parkinson's disease: Secondary | ICD-10-CM | POA: Diagnosis not present

## 2018-03-21 DIAGNOSIS — F0280 Dementia in other diseases classified elsewhere without behavioral disturbance: Secondary | ICD-10-CM | POA: Diagnosis not present

## 2018-03-21 DIAGNOSIS — R627 Adult failure to thrive: Secondary | ICD-10-CM | POA: Diagnosis not present

## 2018-03-21 DIAGNOSIS — Z8673 Personal history of transient ischemic attack (TIA), and cerebral infarction without residual deficits: Secondary | ICD-10-CM | POA: Diagnosis not present

## 2018-03-21 DIAGNOSIS — G309 Alzheimer's disease, unspecified: Secondary | ICD-10-CM | POA: Diagnosis not present

## 2018-06-03 DIAGNOSIS — M1990 Unspecified osteoarthritis, unspecified site: Secondary | ICD-10-CM | POA: Diagnosis not present

## 2018-06-03 DIAGNOSIS — G309 Alzheimer's disease, unspecified: Secondary | ICD-10-CM | POA: Diagnosis not present

## 2018-06-03 DIAGNOSIS — R531 Weakness: Secondary | ICD-10-CM | POA: Diagnosis not present

## 2018-06-03 DIAGNOSIS — E7849 Other hyperlipidemia: Secondary | ICD-10-CM | POA: Diagnosis not present

## 2018-06-03 DIAGNOSIS — R627 Adult failure to thrive: Secondary | ICD-10-CM | POA: Diagnosis not present

## 2018-06-03 DIAGNOSIS — G20 Parkinson's disease: Secondary | ICD-10-CM | POA: Diagnosis not present

## 2018-06-03 DIAGNOSIS — F329 Major depressive disorder, single episode, unspecified: Secondary | ICD-10-CM | POA: Diagnosis not present

## 2018-06-03 DIAGNOSIS — I639 Cerebral infarction, unspecified: Secondary | ICD-10-CM | POA: Diagnosis not present

## 2018-06-03 DIAGNOSIS — E038 Other specified hypothyroidism: Secondary | ICD-10-CM | POA: Diagnosis not present

## 2018-06-03 DIAGNOSIS — Z86711 Personal history of pulmonary embolism: Secondary | ICD-10-CM | POA: Diagnosis not present

## 2018-06-03 DIAGNOSIS — G250 Essential tremor: Secondary | ICD-10-CM | POA: Diagnosis not present

## 2018-06-03 DIAGNOSIS — I10 Essential (primary) hypertension: Secondary | ICD-10-CM | POA: Diagnosis not present

## 2018-11-04 DIAGNOSIS — R069 Unspecified abnormalities of breathing: Secondary | ICD-10-CM | POA: Diagnosis not present

## 2018-11-04 DIAGNOSIS — R0602 Shortness of breath: Secondary | ICD-10-CM | POA: Diagnosis not present

## 2018-11-04 DIAGNOSIS — R531 Weakness: Secondary | ICD-10-CM | POA: Diagnosis not present

## 2018-12-24 DIAGNOSIS — R531 Weakness: Secondary | ICD-10-CM | POA: Diagnosis not present

## 2018-12-24 DIAGNOSIS — G250 Essential tremor: Secondary | ICD-10-CM | POA: Diagnosis not present

## 2018-12-24 DIAGNOSIS — M1990 Unspecified osteoarthritis, unspecified site: Secondary | ICD-10-CM | POA: Diagnosis not present

## 2018-12-24 DIAGNOSIS — E7849 Other hyperlipidemia: Secondary | ICD-10-CM | POA: Diagnosis not present

## 2018-12-24 DIAGNOSIS — G309 Alzheimer's disease, unspecified: Secondary | ICD-10-CM | POA: Diagnosis not present

## 2018-12-24 DIAGNOSIS — G20 Parkinson's disease: Secondary | ICD-10-CM | POA: Diagnosis not present

## 2018-12-24 DIAGNOSIS — I10 Essential (primary) hypertension: Secondary | ICD-10-CM | POA: Diagnosis not present

## 2018-12-24 DIAGNOSIS — I639 Cerebral infarction, unspecified: Secondary | ICD-10-CM | POA: Diagnosis not present

## 2018-12-24 DIAGNOSIS — Z86711 Personal history of pulmonary embolism: Secondary | ICD-10-CM | POA: Diagnosis not present

## 2018-12-24 DIAGNOSIS — E038 Other specified hypothyroidism: Secondary | ICD-10-CM | POA: Diagnosis not present

## 2018-12-24 DIAGNOSIS — Z0000 Encounter for general adult medical examination without abnormal findings: Secondary | ICD-10-CM | POA: Diagnosis not present

## 2018-12-31 DIAGNOSIS — R55 Syncope and collapse: Secondary | ICD-10-CM | POA: Diagnosis not present

## 2019-07-10 DIAGNOSIS — M1990 Unspecified osteoarthritis, unspecified site: Secondary | ICD-10-CM | POA: Diagnosis not present

## 2019-07-10 DIAGNOSIS — Z86711 Personal history of pulmonary embolism: Secondary | ICD-10-CM | POA: Diagnosis not present

## 2019-07-10 DIAGNOSIS — R531 Weakness: Secondary | ICD-10-CM | POA: Diagnosis not present

## 2019-07-10 DIAGNOSIS — E785 Hyperlipidemia, unspecified: Secondary | ICD-10-CM | POA: Diagnosis not present

## 2019-07-10 DIAGNOSIS — G20 Parkinson's disease: Secondary | ICD-10-CM | POA: Diagnosis not present

## 2019-07-10 DIAGNOSIS — G250 Essential tremor: Secondary | ICD-10-CM | POA: Diagnosis not present

## 2019-07-10 DIAGNOSIS — I639 Cerebral infarction, unspecified: Secondary | ICD-10-CM | POA: Diagnosis not present

## 2019-07-10 DIAGNOSIS — F329 Major depressive disorder, single episode, unspecified: Secondary | ICD-10-CM | POA: Diagnosis not present

## 2019-07-10 DIAGNOSIS — I10 Essential (primary) hypertension: Secondary | ICD-10-CM | POA: Diagnosis not present

## 2019-07-10 DIAGNOSIS — E039 Hypothyroidism, unspecified: Secondary | ICD-10-CM | POA: Diagnosis not present

## 2019-07-10 DIAGNOSIS — G309 Alzheimer's disease, unspecified: Secondary | ICD-10-CM | POA: Diagnosis not present

## 2019-10-06 DIAGNOSIS — I959 Hypotension, unspecified: Secondary | ICD-10-CM | POA: Diagnosis not present

## 2019-11-16 DIAGNOSIS — R55 Syncope and collapse: Secondary | ICD-10-CM | POA: Diagnosis not present

## 2019-11-16 DIAGNOSIS — R404 Transient alteration of awareness: Secondary | ICD-10-CM | POA: Diagnosis not present

## 2019-12-30 DIAGNOSIS — E785 Hyperlipidemia, unspecified: Secondary | ICD-10-CM | POA: Diagnosis not present

## 2019-12-30 DIAGNOSIS — M1990 Unspecified osteoarthritis, unspecified site: Secondary | ICD-10-CM | POA: Diagnosis not present

## 2019-12-30 DIAGNOSIS — Z8673 Personal history of transient ischemic attack (TIA), and cerebral infarction without residual deficits: Secondary | ICD-10-CM | POA: Diagnosis not present

## 2019-12-30 DIAGNOSIS — G309 Alzheimer's disease, unspecified: Secondary | ICD-10-CM | POA: Diagnosis not present

## 2019-12-30 DIAGNOSIS — Z0000 Encounter for general adult medical examination without abnormal findings: Secondary | ICD-10-CM | POA: Diagnosis not present

## 2019-12-30 DIAGNOSIS — N1831 Chronic kidney disease, stage 3a: Secondary | ICD-10-CM | POA: Diagnosis not present

## 2019-12-30 DIAGNOSIS — I129 Hypertensive chronic kidney disease with stage 1 through stage 4 chronic kidney disease, or unspecified chronic kidney disease: Secondary | ICD-10-CM | POA: Diagnosis not present

## 2019-12-30 DIAGNOSIS — G20 Parkinson's disease: Secondary | ICD-10-CM | POA: Diagnosis not present

## 2019-12-30 DIAGNOSIS — R55 Syncope and collapse: Secondary | ICD-10-CM | POA: Diagnosis not present

## 2020-01-11 DIAGNOSIS — J189 Pneumonia, unspecified organism: Secondary | ICD-10-CM | POA: Diagnosis not present

## 2020-01-11 DIAGNOSIS — G309 Alzheimer's disease, unspecified: Secondary | ICD-10-CM | POA: Diagnosis not present

## 2020-01-11 DIAGNOSIS — G20 Parkinson's disease: Secondary | ICD-10-CM | POA: Diagnosis not present

## 2020-01-11 DIAGNOSIS — Z1152 Encounter for screening for COVID-19: Secondary | ICD-10-CM | POA: Diagnosis not present

## 2020-01-11 DIAGNOSIS — R0609 Other forms of dyspnea: Secondary | ICD-10-CM | POA: Diagnosis not present

## 2020-01-12 DIAGNOSIS — R531 Weakness: Secondary | ICD-10-CM | POA: Diagnosis not present

## 2020-01-12 DIAGNOSIS — N3946 Mixed incontinence: Secondary | ICD-10-CM | POA: Diagnosis not present

## 2020-01-12 DIAGNOSIS — E039 Hypothyroidism, unspecified: Secondary | ICD-10-CM | POA: Diagnosis not present

## 2020-01-12 DIAGNOSIS — J189 Pneumonia, unspecified organism: Secondary | ICD-10-CM | POA: Diagnosis not present

## 2020-01-12 DIAGNOSIS — R0602 Shortness of breath: Secondary | ICD-10-CM | POA: Diagnosis not present

## 2020-01-12 DIAGNOSIS — E785 Hyperlipidemia, unspecified: Secondary | ICD-10-CM | POA: Diagnosis not present

## 2020-01-12 DIAGNOSIS — M1990 Unspecified osteoarthritis, unspecified site: Secondary | ICD-10-CM | POA: Diagnosis not present

## 2020-01-12 DIAGNOSIS — G309 Alzheimer's disease, unspecified: Secondary | ICD-10-CM | POA: Diagnosis not present

## 2020-01-13 DIAGNOSIS — M1990 Unspecified osteoarthritis, unspecified site: Secondary | ICD-10-CM | POA: Diagnosis not present

## 2020-01-13 DIAGNOSIS — G309 Alzheimer's disease, unspecified: Secondary | ICD-10-CM | POA: Diagnosis not present

## 2020-01-13 DIAGNOSIS — E039 Hypothyroidism, unspecified: Secondary | ICD-10-CM | POA: Diagnosis not present

## 2020-01-13 DIAGNOSIS — J189 Pneumonia, unspecified organism: Secondary | ICD-10-CM | POA: Diagnosis not present

## 2020-01-13 DIAGNOSIS — E785 Hyperlipidemia, unspecified: Secondary | ICD-10-CM | POA: Diagnosis not present

## 2020-01-13 DIAGNOSIS — N3946 Mixed incontinence: Secondary | ICD-10-CM | POA: Diagnosis not present

## 2020-01-15 DIAGNOSIS — J189 Pneumonia, unspecified organism: Secondary | ICD-10-CM | POA: Diagnosis not present

## 2020-01-15 DIAGNOSIS — E785 Hyperlipidemia, unspecified: Secondary | ICD-10-CM | POA: Diagnosis not present

## 2020-01-15 DIAGNOSIS — G309 Alzheimer's disease, unspecified: Secondary | ICD-10-CM | POA: Diagnosis not present

## 2020-01-15 DIAGNOSIS — M1990 Unspecified osteoarthritis, unspecified site: Secondary | ICD-10-CM | POA: Diagnosis not present

## 2020-01-15 DIAGNOSIS — E039 Hypothyroidism, unspecified: Secondary | ICD-10-CM | POA: Diagnosis not present

## 2020-01-15 DIAGNOSIS — N3946 Mixed incontinence: Secondary | ICD-10-CM | POA: Diagnosis not present

## 2020-01-18 DIAGNOSIS — G309 Alzheimer's disease, unspecified: Secondary | ICD-10-CM | POA: Diagnosis not present

## 2020-01-18 DIAGNOSIS — E039 Hypothyroidism, unspecified: Secondary | ICD-10-CM | POA: Diagnosis not present

## 2020-01-18 DIAGNOSIS — N3946 Mixed incontinence: Secondary | ICD-10-CM | POA: Diagnosis not present

## 2020-01-18 DIAGNOSIS — M1990 Unspecified osteoarthritis, unspecified site: Secondary | ICD-10-CM | POA: Diagnosis not present

## 2020-01-18 DIAGNOSIS — E785 Hyperlipidemia, unspecified: Secondary | ICD-10-CM | POA: Diagnosis not present

## 2020-01-18 DIAGNOSIS — J189 Pneumonia, unspecified organism: Secondary | ICD-10-CM | POA: Diagnosis not present

## 2020-01-19 DIAGNOSIS — M1990 Unspecified osteoarthritis, unspecified site: Secondary | ICD-10-CM | POA: Diagnosis not present

## 2020-01-19 DIAGNOSIS — J189 Pneumonia, unspecified organism: Secondary | ICD-10-CM | POA: Diagnosis not present

## 2020-01-19 DIAGNOSIS — G309 Alzheimer's disease, unspecified: Secondary | ICD-10-CM | POA: Diagnosis not present

## 2020-01-19 DIAGNOSIS — E785 Hyperlipidemia, unspecified: Secondary | ICD-10-CM | POA: Diagnosis not present

## 2020-01-19 DIAGNOSIS — N3946 Mixed incontinence: Secondary | ICD-10-CM | POA: Diagnosis not present

## 2020-01-19 DIAGNOSIS — E039 Hypothyroidism, unspecified: Secondary | ICD-10-CM | POA: Diagnosis not present

## 2020-01-20 DIAGNOSIS — M1990 Unspecified osteoarthritis, unspecified site: Secondary | ICD-10-CM | POA: Diagnosis not present

## 2020-01-20 DIAGNOSIS — G309 Alzheimer's disease, unspecified: Secondary | ICD-10-CM | POA: Diagnosis not present

## 2020-01-20 DIAGNOSIS — N3946 Mixed incontinence: Secondary | ICD-10-CM | POA: Diagnosis not present

## 2020-01-20 DIAGNOSIS — E785 Hyperlipidemia, unspecified: Secondary | ICD-10-CM | POA: Diagnosis not present

## 2020-01-20 DIAGNOSIS — J189 Pneumonia, unspecified organism: Secondary | ICD-10-CM | POA: Diagnosis not present

## 2020-01-20 DIAGNOSIS — E039 Hypothyroidism, unspecified: Secondary | ICD-10-CM | POA: Diagnosis not present

## 2020-01-22 DIAGNOSIS — M1990 Unspecified osteoarthritis, unspecified site: Secondary | ICD-10-CM | POA: Diagnosis not present

## 2020-01-22 DIAGNOSIS — E785 Hyperlipidemia, unspecified: Secondary | ICD-10-CM | POA: Diagnosis not present

## 2020-01-22 DIAGNOSIS — N3946 Mixed incontinence: Secondary | ICD-10-CM | POA: Diagnosis not present

## 2020-01-22 DIAGNOSIS — G309 Alzheimer's disease, unspecified: Secondary | ICD-10-CM | POA: Diagnosis not present

## 2020-01-22 DIAGNOSIS — J189 Pneumonia, unspecified organism: Secondary | ICD-10-CM | POA: Diagnosis not present

## 2020-01-22 DIAGNOSIS — E039 Hypothyroidism, unspecified: Secondary | ICD-10-CM | POA: Diagnosis not present

## 2020-01-25 DIAGNOSIS — N3946 Mixed incontinence: Secondary | ICD-10-CM | POA: Diagnosis not present

## 2020-01-25 DIAGNOSIS — J189 Pneumonia, unspecified organism: Secondary | ICD-10-CM | POA: Diagnosis not present

## 2020-01-25 DIAGNOSIS — M1990 Unspecified osteoarthritis, unspecified site: Secondary | ICD-10-CM | POA: Diagnosis not present

## 2020-01-25 DIAGNOSIS — G309 Alzheimer's disease, unspecified: Secondary | ICD-10-CM | POA: Diagnosis not present

## 2020-01-25 DIAGNOSIS — E039 Hypothyroidism, unspecified: Secondary | ICD-10-CM | POA: Diagnosis not present

## 2020-01-25 DIAGNOSIS — E785 Hyperlipidemia, unspecified: Secondary | ICD-10-CM | POA: Diagnosis not present

## 2020-01-27 DIAGNOSIS — E039 Hypothyroidism, unspecified: Secondary | ICD-10-CM | POA: Diagnosis not present

## 2020-01-27 DIAGNOSIS — E785 Hyperlipidemia, unspecified: Secondary | ICD-10-CM | POA: Diagnosis not present

## 2020-01-27 DIAGNOSIS — G309 Alzheimer's disease, unspecified: Secondary | ICD-10-CM | POA: Diagnosis not present

## 2020-01-27 DIAGNOSIS — M1990 Unspecified osteoarthritis, unspecified site: Secondary | ICD-10-CM | POA: Diagnosis not present

## 2020-01-27 DIAGNOSIS — N3946 Mixed incontinence: Secondary | ICD-10-CM | POA: Diagnosis not present

## 2020-01-27 DIAGNOSIS — J189 Pneumonia, unspecified organism: Secondary | ICD-10-CM | POA: Diagnosis not present

## 2020-01-28 DIAGNOSIS — R0602 Shortness of breath: Secondary | ICD-10-CM | POA: Diagnosis not present

## 2020-01-28 DIAGNOSIS — G309 Alzheimer's disease, unspecified: Secondary | ICD-10-CM | POA: Diagnosis not present

## 2020-01-28 DIAGNOSIS — M1990 Unspecified osteoarthritis, unspecified site: Secondary | ICD-10-CM | POA: Diagnosis not present

## 2020-01-28 DIAGNOSIS — E785 Hyperlipidemia, unspecified: Secondary | ICD-10-CM | POA: Diagnosis not present

## 2020-01-28 DIAGNOSIS — J189 Pneumonia, unspecified organism: Secondary | ICD-10-CM | POA: Diagnosis not present

## 2020-01-28 DIAGNOSIS — E039 Hypothyroidism, unspecified: Secondary | ICD-10-CM | POA: Diagnosis not present

## 2020-01-28 DIAGNOSIS — R531 Weakness: Secondary | ICD-10-CM | POA: Diagnosis not present

## 2020-01-28 DIAGNOSIS — N3946 Mixed incontinence: Secondary | ICD-10-CM | POA: Diagnosis not present

## 2020-01-29 DIAGNOSIS — G309 Alzheimer's disease, unspecified: Secondary | ICD-10-CM | POA: Diagnosis not present

## 2020-01-29 DIAGNOSIS — N3946 Mixed incontinence: Secondary | ICD-10-CM | POA: Diagnosis not present

## 2020-01-29 DIAGNOSIS — J189 Pneumonia, unspecified organism: Secondary | ICD-10-CM | POA: Diagnosis not present

## 2020-01-29 DIAGNOSIS — E039 Hypothyroidism, unspecified: Secondary | ICD-10-CM | POA: Diagnosis not present

## 2020-01-29 DIAGNOSIS — M1990 Unspecified osteoarthritis, unspecified site: Secondary | ICD-10-CM | POA: Diagnosis not present

## 2020-01-29 DIAGNOSIS — E785 Hyperlipidemia, unspecified: Secondary | ICD-10-CM | POA: Diagnosis not present

## 2020-02-01 DIAGNOSIS — N3946 Mixed incontinence: Secondary | ICD-10-CM | POA: Diagnosis not present

## 2020-02-01 DIAGNOSIS — E785 Hyperlipidemia, unspecified: Secondary | ICD-10-CM | POA: Diagnosis not present

## 2020-02-01 DIAGNOSIS — J189 Pneumonia, unspecified organism: Secondary | ICD-10-CM | POA: Diagnosis not present

## 2020-02-01 DIAGNOSIS — G309 Alzheimer's disease, unspecified: Secondary | ICD-10-CM | POA: Diagnosis not present

## 2020-02-01 DIAGNOSIS — E039 Hypothyroidism, unspecified: Secondary | ICD-10-CM | POA: Diagnosis not present

## 2020-02-01 DIAGNOSIS — M1990 Unspecified osteoarthritis, unspecified site: Secondary | ICD-10-CM | POA: Diagnosis not present

## 2020-02-03 DIAGNOSIS — G309 Alzheimer's disease, unspecified: Secondary | ICD-10-CM | POA: Diagnosis not present

## 2020-02-03 DIAGNOSIS — E039 Hypothyroidism, unspecified: Secondary | ICD-10-CM | POA: Diagnosis not present

## 2020-02-03 DIAGNOSIS — J189 Pneumonia, unspecified organism: Secondary | ICD-10-CM | POA: Diagnosis not present

## 2020-02-03 DIAGNOSIS — E785 Hyperlipidemia, unspecified: Secondary | ICD-10-CM | POA: Diagnosis not present

## 2020-02-03 DIAGNOSIS — M1990 Unspecified osteoarthritis, unspecified site: Secondary | ICD-10-CM | POA: Diagnosis not present

## 2020-02-03 DIAGNOSIS — N3946 Mixed incontinence: Secondary | ICD-10-CM | POA: Diagnosis not present

## 2020-02-05 DIAGNOSIS — N3946 Mixed incontinence: Secondary | ICD-10-CM | POA: Diagnosis not present

## 2020-02-05 DIAGNOSIS — M1990 Unspecified osteoarthritis, unspecified site: Secondary | ICD-10-CM | POA: Diagnosis not present

## 2020-02-05 DIAGNOSIS — J189 Pneumonia, unspecified organism: Secondary | ICD-10-CM | POA: Diagnosis not present

## 2020-02-05 DIAGNOSIS — E039 Hypothyroidism, unspecified: Secondary | ICD-10-CM | POA: Diagnosis not present

## 2020-02-05 DIAGNOSIS — E785 Hyperlipidemia, unspecified: Secondary | ICD-10-CM | POA: Diagnosis not present

## 2020-02-05 DIAGNOSIS — G309 Alzheimer's disease, unspecified: Secondary | ICD-10-CM | POA: Diagnosis not present

## 2020-02-08 DIAGNOSIS — E039 Hypothyroidism, unspecified: Secondary | ICD-10-CM | POA: Diagnosis not present

## 2020-02-08 DIAGNOSIS — N3946 Mixed incontinence: Secondary | ICD-10-CM | POA: Diagnosis not present

## 2020-02-08 DIAGNOSIS — G309 Alzheimer's disease, unspecified: Secondary | ICD-10-CM | POA: Diagnosis not present

## 2020-02-08 DIAGNOSIS — M1990 Unspecified osteoarthritis, unspecified site: Secondary | ICD-10-CM | POA: Diagnosis not present

## 2020-02-08 DIAGNOSIS — E785 Hyperlipidemia, unspecified: Secondary | ICD-10-CM | POA: Diagnosis not present

## 2020-02-08 DIAGNOSIS — J189 Pneumonia, unspecified organism: Secondary | ICD-10-CM | POA: Diagnosis not present

## 2020-02-10 DIAGNOSIS — M1990 Unspecified osteoarthritis, unspecified site: Secondary | ICD-10-CM | POA: Diagnosis not present

## 2020-02-10 DIAGNOSIS — G309 Alzheimer's disease, unspecified: Secondary | ICD-10-CM | POA: Diagnosis not present

## 2020-02-10 DIAGNOSIS — E785 Hyperlipidemia, unspecified: Secondary | ICD-10-CM | POA: Diagnosis not present

## 2020-02-10 DIAGNOSIS — N3946 Mixed incontinence: Secondary | ICD-10-CM | POA: Diagnosis not present

## 2020-02-10 DIAGNOSIS — J189 Pneumonia, unspecified organism: Secondary | ICD-10-CM | POA: Diagnosis not present

## 2020-02-10 DIAGNOSIS — E039 Hypothyroidism, unspecified: Secondary | ICD-10-CM | POA: Diagnosis not present

## 2020-02-12 DIAGNOSIS — E039 Hypothyroidism, unspecified: Secondary | ICD-10-CM | POA: Diagnosis not present

## 2020-02-12 DIAGNOSIS — M1990 Unspecified osteoarthritis, unspecified site: Secondary | ICD-10-CM | POA: Diagnosis not present

## 2020-02-12 DIAGNOSIS — J189 Pneumonia, unspecified organism: Secondary | ICD-10-CM | POA: Diagnosis not present

## 2020-02-12 DIAGNOSIS — N3946 Mixed incontinence: Secondary | ICD-10-CM | POA: Diagnosis not present

## 2020-02-12 DIAGNOSIS — E785 Hyperlipidemia, unspecified: Secondary | ICD-10-CM | POA: Diagnosis not present

## 2020-02-12 DIAGNOSIS — G309 Alzheimer's disease, unspecified: Secondary | ICD-10-CM | POA: Diagnosis not present

## 2020-02-15 DIAGNOSIS — G309 Alzheimer's disease, unspecified: Secondary | ICD-10-CM | POA: Diagnosis not present

## 2020-02-15 DIAGNOSIS — N3946 Mixed incontinence: Secondary | ICD-10-CM | POA: Diagnosis not present

## 2020-02-15 DIAGNOSIS — J189 Pneumonia, unspecified organism: Secondary | ICD-10-CM | POA: Diagnosis not present

## 2020-02-15 DIAGNOSIS — E785 Hyperlipidemia, unspecified: Secondary | ICD-10-CM | POA: Diagnosis not present

## 2020-02-15 DIAGNOSIS — E039 Hypothyroidism, unspecified: Secondary | ICD-10-CM | POA: Diagnosis not present

## 2020-02-15 DIAGNOSIS — M1990 Unspecified osteoarthritis, unspecified site: Secondary | ICD-10-CM | POA: Diagnosis not present

## 2020-02-17 DIAGNOSIS — E785 Hyperlipidemia, unspecified: Secondary | ICD-10-CM | POA: Diagnosis not present

## 2020-02-17 DIAGNOSIS — N3946 Mixed incontinence: Secondary | ICD-10-CM | POA: Diagnosis not present

## 2020-02-17 DIAGNOSIS — E039 Hypothyroidism, unspecified: Secondary | ICD-10-CM | POA: Diagnosis not present

## 2020-02-17 DIAGNOSIS — J189 Pneumonia, unspecified organism: Secondary | ICD-10-CM | POA: Diagnosis not present

## 2020-02-17 DIAGNOSIS — M1990 Unspecified osteoarthritis, unspecified site: Secondary | ICD-10-CM | POA: Diagnosis not present

## 2020-02-17 DIAGNOSIS — G309 Alzheimer's disease, unspecified: Secondary | ICD-10-CM | POA: Diagnosis not present

## 2020-02-19 DIAGNOSIS — M1990 Unspecified osteoarthritis, unspecified site: Secondary | ICD-10-CM | POA: Diagnosis not present

## 2020-02-19 DIAGNOSIS — E039 Hypothyroidism, unspecified: Secondary | ICD-10-CM | POA: Diagnosis not present

## 2020-02-19 DIAGNOSIS — G309 Alzheimer's disease, unspecified: Secondary | ICD-10-CM | POA: Diagnosis not present

## 2020-02-19 DIAGNOSIS — N3946 Mixed incontinence: Secondary | ICD-10-CM | POA: Diagnosis not present

## 2020-02-19 DIAGNOSIS — J189 Pneumonia, unspecified organism: Secondary | ICD-10-CM | POA: Diagnosis not present

## 2020-02-19 DIAGNOSIS — E785 Hyperlipidemia, unspecified: Secondary | ICD-10-CM | POA: Diagnosis not present

## 2020-02-22 DIAGNOSIS — E039 Hypothyroidism, unspecified: Secondary | ICD-10-CM | POA: Diagnosis not present

## 2020-02-22 DIAGNOSIS — N3946 Mixed incontinence: Secondary | ICD-10-CM | POA: Diagnosis not present

## 2020-02-22 DIAGNOSIS — M1990 Unspecified osteoarthritis, unspecified site: Secondary | ICD-10-CM | POA: Diagnosis not present

## 2020-02-22 DIAGNOSIS — G309 Alzheimer's disease, unspecified: Secondary | ICD-10-CM | POA: Diagnosis not present

## 2020-02-22 DIAGNOSIS — E785 Hyperlipidemia, unspecified: Secondary | ICD-10-CM | POA: Diagnosis not present

## 2020-02-22 DIAGNOSIS — J189 Pneumonia, unspecified organism: Secondary | ICD-10-CM | POA: Diagnosis not present

## 2020-02-23 DIAGNOSIS — E039 Hypothyroidism, unspecified: Secondary | ICD-10-CM | POA: Diagnosis not present

## 2020-02-23 DIAGNOSIS — N3946 Mixed incontinence: Secondary | ICD-10-CM | POA: Diagnosis not present

## 2020-02-23 DIAGNOSIS — E785 Hyperlipidemia, unspecified: Secondary | ICD-10-CM | POA: Diagnosis not present

## 2020-02-23 DIAGNOSIS — M1990 Unspecified osteoarthritis, unspecified site: Secondary | ICD-10-CM | POA: Diagnosis not present

## 2020-02-23 DIAGNOSIS — J189 Pneumonia, unspecified organism: Secondary | ICD-10-CM | POA: Diagnosis not present

## 2020-02-23 DIAGNOSIS — G309 Alzheimer's disease, unspecified: Secondary | ICD-10-CM | POA: Diagnosis not present

## 2020-02-24 DIAGNOSIS — J189 Pneumonia, unspecified organism: Secondary | ICD-10-CM | POA: Diagnosis not present

## 2020-02-24 DIAGNOSIS — E039 Hypothyroidism, unspecified: Secondary | ICD-10-CM | POA: Diagnosis not present

## 2020-02-24 DIAGNOSIS — M1990 Unspecified osteoarthritis, unspecified site: Secondary | ICD-10-CM | POA: Diagnosis not present

## 2020-02-24 DIAGNOSIS — G309 Alzheimer's disease, unspecified: Secondary | ICD-10-CM | POA: Diagnosis not present

## 2020-02-24 DIAGNOSIS — E785 Hyperlipidemia, unspecified: Secondary | ICD-10-CM | POA: Diagnosis not present

## 2020-02-24 DIAGNOSIS — N3946 Mixed incontinence: Secondary | ICD-10-CM | POA: Diagnosis not present

## 2020-02-25 DIAGNOSIS — E039 Hypothyroidism, unspecified: Secondary | ICD-10-CM | POA: Diagnosis not present

## 2020-02-25 DIAGNOSIS — E785 Hyperlipidemia, unspecified: Secondary | ICD-10-CM | POA: Diagnosis not present

## 2020-02-25 DIAGNOSIS — M1990 Unspecified osteoarthritis, unspecified site: Secondary | ICD-10-CM | POA: Diagnosis not present

## 2020-02-25 DIAGNOSIS — J189 Pneumonia, unspecified organism: Secondary | ICD-10-CM | POA: Diagnosis not present

## 2020-02-25 DIAGNOSIS — G309 Alzheimer's disease, unspecified: Secondary | ICD-10-CM | POA: Diagnosis not present

## 2020-02-25 DIAGNOSIS — N3946 Mixed incontinence: Secondary | ICD-10-CM | POA: Diagnosis not present

## 2020-02-26 DIAGNOSIS — M1990 Unspecified osteoarthritis, unspecified site: Secondary | ICD-10-CM | POA: Diagnosis not present

## 2020-02-26 DIAGNOSIS — G309 Alzheimer's disease, unspecified: Secondary | ICD-10-CM | POA: Diagnosis not present

## 2020-02-26 DIAGNOSIS — N3946 Mixed incontinence: Secondary | ICD-10-CM | POA: Diagnosis not present

## 2020-02-26 DIAGNOSIS — E785 Hyperlipidemia, unspecified: Secondary | ICD-10-CM | POA: Diagnosis not present

## 2020-02-26 DIAGNOSIS — J189 Pneumonia, unspecified organism: Secondary | ICD-10-CM | POA: Diagnosis not present

## 2020-02-26 DIAGNOSIS — E039 Hypothyroidism, unspecified: Secondary | ICD-10-CM | POA: Diagnosis not present

## 2020-02-27 DIAGNOSIS — R531 Weakness: Secondary | ICD-10-CM | POA: Diagnosis not present

## 2020-02-27 DIAGNOSIS — E785 Hyperlipidemia, unspecified: Secondary | ICD-10-CM | POA: Diagnosis not present

## 2020-02-27 DIAGNOSIS — N3946 Mixed incontinence: Secondary | ICD-10-CM | POA: Diagnosis not present

## 2020-02-27 DIAGNOSIS — J189 Pneumonia, unspecified organism: Secondary | ICD-10-CM | POA: Diagnosis not present

## 2020-02-27 DIAGNOSIS — M1990 Unspecified osteoarthritis, unspecified site: Secondary | ICD-10-CM | POA: Diagnosis not present

## 2020-02-27 DIAGNOSIS — R0602 Shortness of breath: Secondary | ICD-10-CM | POA: Diagnosis not present

## 2020-02-27 DIAGNOSIS — G309 Alzheimer's disease, unspecified: Secondary | ICD-10-CM | POA: Diagnosis not present

## 2020-02-27 DIAGNOSIS — E039 Hypothyroidism, unspecified: Secondary | ICD-10-CM | POA: Diagnosis not present

## 2020-02-29 DIAGNOSIS — G309 Alzheimer's disease, unspecified: Secondary | ICD-10-CM | POA: Diagnosis not present

## 2020-02-29 DIAGNOSIS — E785 Hyperlipidemia, unspecified: Secondary | ICD-10-CM | POA: Diagnosis not present

## 2020-02-29 DIAGNOSIS — M1990 Unspecified osteoarthritis, unspecified site: Secondary | ICD-10-CM | POA: Diagnosis not present

## 2020-02-29 DIAGNOSIS — E039 Hypothyroidism, unspecified: Secondary | ICD-10-CM | POA: Diagnosis not present

## 2020-02-29 DIAGNOSIS — N3946 Mixed incontinence: Secondary | ICD-10-CM | POA: Diagnosis not present

## 2020-02-29 DIAGNOSIS — J189 Pneumonia, unspecified organism: Secondary | ICD-10-CM | POA: Diagnosis not present

## 2020-03-01 DIAGNOSIS — J189 Pneumonia, unspecified organism: Secondary | ICD-10-CM | POA: Diagnosis not present

## 2020-03-01 DIAGNOSIS — N3946 Mixed incontinence: Secondary | ICD-10-CM | POA: Diagnosis not present

## 2020-03-01 DIAGNOSIS — E785 Hyperlipidemia, unspecified: Secondary | ICD-10-CM | POA: Diagnosis not present

## 2020-03-01 DIAGNOSIS — E039 Hypothyroidism, unspecified: Secondary | ICD-10-CM | POA: Diagnosis not present

## 2020-03-01 DIAGNOSIS — M1990 Unspecified osteoarthritis, unspecified site: Secondary | ICD-10-CM | POA: Diagnosis not present

## 2020-03-01 DIAGNOSIS — G309 Alzheimer's disease, unspecified: Secondary | ICD-10-CM | POA: Diagnosis not present

## 2020-03-02 DIAGNOSIS — N3946 Mixed incontinence: Secondary | ICD-10-CM | POA: Diagnosis not present

## 2020-03-02 DIAGNOSIS — J189 Pneumonia, unspecified organism: Secondary | ICD-10-CM | POA: Diagnosis not present

## 2020-03-02 DIAGNOSIS — M1990 Unspecified osteoarthritis, unspecified site: Secondary | ICD-10-CM | POA: Diagnosis not present

## 2020-03-02 DIAGNOSIS — G309 Alzheimer's disease, unspecified: Secondary | ICD-10-CM | POA: Diagnosis not present

## 2020-03-02 DIAGNOSIS — E039 Hypothyroidism, unspecified: Secondary | ICD-10-CM | POA: Diagnosis not present

## 2020-03-02 DIAGNOSIS — E785 Hyperlipidemia, unspecified: Secondary | ICD-10-CM | POA: Diagnosis not present

## 2020-03-03 DIAGNOSIS — G309 Alzheimer's disease, unspecified: Secondary | ICD-10-CM | POA: Diagnosis not present

## 2020-03-03 DIAGNOSIS — E785 Hyperlipidemia, unspecified: Secondary | ICD-10-CM | POA: Diagnosis not present

## 2020-03-03 DIAGNOSIS — N3946 Mixed incontinence: Secondary | ICD-10-CM | POA: Diagnosis not present

## 2020-03-03 DIAGNOSIS — E039 Hypothyroidism, unspecified: Secondary | ICD-10-CM | POA: Diagnosis not present

## 2020-03-03 DIAGNOSIS — M1990 Unspecified osteoarthritis, unspecified site: Secondary | ICD-10-CM | POA: Diagnosis not present

## 2020-03-03 DIAGNOSIS — J189 Pneumonia, unspecified organism: Secondary | ICD-10-CM | POA: Diagnosis not present

## 2020-03-04 DIAGNOSIS — E785 Hyperlipidemia, unspecified: Secondary | ICD-10-CM | POA: Diagnosis not present

## 2020-03-04 DIAGNOSIS — N3946 Mixed incontinence: Secondary | ICD-10-CM | POA: Diagnosis not present

## 2020-03-04 DIAGNOSIS — J189 Pneumonia, unspecified organism: Secondary | ICD-10-CM | POA: Diagnosis not present

## 2020-03-04 DIAGNOSIS — G309 Alzheimer's disease, unspecified: Secondary | ICD-10-CM | POA: Diagnosis not present

## 2020-03-04 DIAGNOSIS — E039 Hypothyroidism, unspecified: Secondary | ICD-10-CM | POA: Diagnosis not present

## 2020-03-04 DIAGNOSIS — M1990 Unspecified osteoarthritis, unspecified site: Secondary | ICD-10-CM | POA: Diagnosis not present

## 2020-03-07 DIAGNOSIS — E039 Hypothyroidism, unspecified: Secondary | ICD-10-CM | POA: Diagnosis not present

## 2020-03-07 DIAGNOSIS — E785 Hyperlipidemia, unspecified: Secondary | ICD-10-CM | POA: Diagnosis not present

## 2020-03-07 DIAGNOSIS — J189 Pneumonia, unspecified organism: Secondary | ICD-10-CM | POA: Diagnosis not present

## 2020-03-07 DIAGNOSIS — M1990 Unspecified osteoarthritis, unspecified site: Secondary | ICD-10-CM | POA: Diagnosis not present

## 2020-03-07 DIAGNOSIS — G309 Alzheimer's disease, unspecified: Secondary | ICD-10-CM | POA: Diagnosis not present

## 2020-03-07 DIAGNOSIS — N3946 Mixed incontinence: Secondary | ICD-10-CM | POA: Diagnosis not present

## 2020-03-08 DIAGNOSIS — E039 Hypothyroidism, unspecified: Secondary | ICD-10-CM | POA: Diagnosis not present

## 2020-03-08 DIAGNOSIS — E785 Hyperlipidemia, unspecified: Secondary | ICD-10-CM | POA: Diagnosis not present

## 2020-03-08 DIAGNOSIS — M1990 Unspecified osteoarthritis, unspecified site: Secondary | ICD-10-CM | POA: Diagnosis not present

## 2020-03-08 DIAGNOSIS — N3946 Mixed incontinence: Secondary | ICD-10-CM | POA: Diagnosis not present

## 2020-03-08 DIAGNOSIS — J189 Pneumonia, unspecified organism: Secondary | ICD-10-CM | POA: Diagnosis not present

## 2020-03-08 DIAGNOSIS — G309 Alzheimer's disease, unspecified: Secondary | ICD-10-CM | POA: Diagnosis not present

## 2020-03-09 DIAGNOSIS — M1990 Unspecified osteoarthritis, unspecified site: Secondary | ICD-10-CM | POA: Diagnosis not present

## 2020-03-09 DIAGNOSIS — N3946 Mixed incontinence: Secondary | ICD-10-CM | POA: Diagnosis not present

## 2020-03-09 DIAGNOSIS — E039 Hypothyroidism, unspecified: Secondary | ICD-10-CM | POA: Diagnosis not present

## 2020-03-09 DIAGNOSIS — J189 Pneumonia, unspecified organism: Secondary | ICD-10-CM | POA: Diagnosis not present

## 2020-03-09 DIAGNOSIS — G309 Alzheimer's disease, unspecified: Secondary | ICD-10-CM | POA: Diagnosis not present

## 2020-03-09 DIAGNOSIS — E785 Hyperlipidemia, unspecified: Secondary | ICD-10-CM | POA: Diagnosis not present

## 2020-03-10 DIAGNOSIS — E039 Hypothyroidism, unspecified: Secondary | ICD-10-CM | POA: Diagnosis not present

## 2020-03-10 DIAGNOSIS — N3946 Mixed incontinence: Secondary | ICD-10-CM | POA: Diagnosis not present

## 2020-03-10 DIAGNOSIS — G309 Alzheimer's disease, unspecified: Secondary | ICD-10-CM | POA: Diagnosis not present

## 2020-03-10 DIAGNOSIS — J189 Pneumonia, unspecified organism: Secondary | ICD-10-CM | POA: Diagnosis not present

## 2020-03-10 DIAGNOSIS — E785 Hyperlipidemia, unspecified: Secondary | ICD-10-CM | POA: Diagnosis not present

## 2020-03-10 DIAGNOSIS — M1990 Unspecified osteoarthritis, unspecified site: Secondary | ICD-10-CM | POA: Diagnosis not present

## 2020-03-11 DIAGNOSIS — G309 Alzheimer's disease, unspecified: Secondary | ICD-10-CM | POA: Diagnosis not present

## 2020-03-11 DIAGNOSIS — N3946 Mixed incontinence: Secondary | ICD-10-CM | POA: Diagnosis not present

## 2020-03-11 DIAGNOSIS — J189 Pneumonia, unspecified organism: Secondary | ICD-10-CM | POA: Diagnosis not present

## 2020-03-11 DIAGNOSIS — E039 Hypothyroidism, unspecified: Secondary | ICD-10-CM | POA: Diagnosis not present

## 2020-03-11 DIAGNOSIS — M1990 Unspecified osteoarthritis, unspecified site: Secondary | ICD-10-CM | POA: Diagnosis not present

## 2020-03-11 DIAGNOSIS — E785 Hyperlipidemia, unspecified: Secondary | ICD-10-CM | POA: Diagnosis not present

## 2020-03-14 DIAGNOSIS — N3946 Mixed incontinence: Secondary | ICD-10-CM | POA: Diagnosis not present

## 2020-03-14 DIAGNOSIS — G309 Alzheimer's disease, unspecified: Secondary | ICD-10-CM | POA: Diagnosis not present

## 2020-03-14 DIAGNOSIS — J189 Pneumonia, unspecified organism: Secondary | ICD-10-CM | POA: Diagnosis not present

## 2020-03-14 DIAGNOSIS — E039 Hypothyroidism, unspecified: Secondary | ICD-10-CM | POA: Diagnosis not present

## 2020-03-14 DIAGNOSIS — E785 Hyperlipidemia, unspecified: Secondary | ICD-10-CM | POA: Diagnosis not present

## 2020-03-14 DIAGNOSIS — M1990 Unspecified osteoarthritis, unspecified site: Secondary | ICD-10-CM | POA: Diagnosis not present

## 2020-03-15 DIAGNOSIS — N3946 Mixed incontinence: Secondary | ICD-10-CM | POA: Diagnosis not present

## 2020-03-15 DIAGNOSIS — M1990 Unspecified osteoarthritis, unspecified site: Secondary | ICD-10-CM | POA: Diagnosis not present

## 2020-03-15 DIAGNOSIS — J189 Pneumonia, unspecified organism: Secondary | ICD-10-CM | POA: Diagnosis not present

## 2020-03-15 DIAGNOSIS — G309 Alzheimer's disease, unspecified: Secondary | ICD-10-CM | POA: Diagnosis not present

## 2020-03-15 DIAGNOSIS — E785 Hyperlipidemia, unspecified: Secondary | ICD-10-CM | POA: Diagnosis not present

## 2020-03-15 DIAGNOSIS — E039 Hypothyroidism, unspecified: Secondary | ICD-10-CM | POA: Diagnosis not present

## 2020-03-16 DIAGNOSIS — G309 Alzheimer's disease, unspecified: Secondary | ICD-10-CM | POA: Diagnosis not present

## 2020-03-16 DIAGNOSIS — N3946 Mixed incontinence: Secondary | ICD-10-CM | POA: Diagnosis not present

## 2020-03-16 DIAGNOSIS — E785 Hyperlipidemia, unspecified: Secondary | ICD-10-CM | POA: Diagnosis not present

## 2020-03-16 DIAGNOSIS — M1990 Unspecified osteoarthritis, unspecified site: Secondary | ICD-10-CM | POA: Diagnosis not present

## 2020-03-16 DIAGNOSIS — E039 Hypothyroidism, unspecified: Secondary | ICD-10-CM | POA: Diagnosis not present

## 2020-03-16 DIAGNOSIS — J189 Pneumonia, unspecified organism: Secondary | ICD-10-CM | POA: Diagnosis not present

## 2020-03-17 DIAGNOSIS — G309 Alzheimer's disease, unspecified: Secondary | ICD-10-CM | POA: Diagnosis not present

## 2020-03-17 DIAGNOSIS — N3946 Mixed incontinence: Secondary | ICD-10-CM | POA: Diagnosis not present

## 2020-03-17 DIAGNOSIS — E785 Hyperlipidemia, unspecified: Secondary | ICD-10-CM | POA: Diagnosis not present

## 2020-03-17 DIAGNOSIS — J189 Pneumonia, unspecified organism: Secondary | ICD-10-CM | POA: Diagnosis not present

## 2020-03-17 DIAGNOSIS — E039 Hypothyroidism, unspecified: Secondary | ICD-10-CM | POA: Diagnosis not present

## 2020-03-17 DIAGNOSIS — M1990 Unspecified osteoarthritis, unspecified site: Secondary | ICD-10-CM | POA: Diagnosis not present

## 2020-03-21 DIAGNOSIS — E785 Hyperlipidemia, unspecified: Secondary | ICD-10-CM | POA: Diagnosis not present

## 2020-03-21 DIAGNOSIS — E039 Hypothyroidism, unspecified: Secondary | ICD-10-CM | POA: Diagnosis not present

## 2020-03-21 DIAGNOSIS — M1990 Unspecified osteoarthritis, unspecified site: Secondary | ICD-10-CM | POA: Diagnosis not present

## 2020-03-21 DIAGNOSIS — N3946 Mixed incontinence: Secondary | ICD-10-CM | POA: Diagnosis not present

## 2020-03-21 DIAGNOSIS — G309 Alzheimer's disease, unspecified: Secondary | ICD-10-CM | POA: Diagnosis not present

## 2020-03-21 DIAGNOSIS — J189 Pneumonia, unspecified organism: Secondary | ICD-10-CM | POA: Diagnosis not present

## 2020-03-22 DIAGNOSIS — M1990 Unspecified osteoarthritis, unspecified site: Secondary | ICD-10-CM | POA: Diagnosis not present

## 2020-03-22 DIAGNOSIS — E785 Hyperlipidemia, unspecified: Secondary | ICD-10-CM | POA: Diagnosis not present

## 2020-03-22 DIAGNOSIS — N3946 Mixed incontinence: Secondary | ICD-10-CM | POA: Diagnosis not present

## 2020-03-22 DIAGNOSIS — G309 Alzheimer's disease, unspecified: Secondary | ICD-10-CM | POA: Diagnosis not present

## 2020-03-22 DIAGNOSIS — J189 Pneumonia, unspecified organism: Secondary | ICD-10-CM | POA: Diagnosis not present

## 2020-03-22 DIAGNOSIS — E039 Hypothyroidism, unspecified: Secondary | ICD-10-CM | POA: Diagnosis not present

## 2020-03-23 DIAGNOSIS — G309 Alzheimer's disease, unspecified: Secondary | ICD-10-CM | POA: Diagnosis not present

## 2020-03-23 DIAGNOSIS — J189 Pneumonia, unspecified organism: Secondary | ICD-10-CM | POA: Diagnosis not present

## 2020-03-23 DIAGNOSIS — E785 Hyperlipidemia, unspecified: Secondary | ICD-10-CM | POA: Diagnosis not present

## 2020-03-23 DIAGNOSIS — M1990 Unspecified osteoarthritis, unspecified site: Secondary | ICD-10-CM | POA: Diagnosis not present

## 2020-03-23 DIAGNOSIS — E039 Hypothyroidism, unspecified: Secondary | ICD-10-CM | POA: Diagnosis not present

## 2020-03-23 DIAGNOSIS — N3946 Mixed incontinence: Secondary | ICD-10-CM | POA: Diagnosis not present

## 2020-03-24 DIAGNOSIS — G309 Alzheimer's disease, unspecified: Secondary | ICD-10-CM | POA: Diagnosis not present

## 2020-03-24 DIAGNOSIS — E039 Hypothyroidism, unspecified: Secondary | ICD-10-CM | POA: Diagnosis not present

## 2020-03-24 DIAGNOSIS — M1990 Unspecified osteoarthritis, unspecified site: Secondary | ICD-10-CM | POA: Diagnosis not present

## 2020-03-24 DIAGNOSIS — E785 Hyperlipidemia, unspecified: Secondary | ICD-10-CM | POA: Diagnosis not present

## 2020-03-24 DIAGNOSIS — N3946 Mixed incontinence: Secondary | ICD-10-CM | POA: Diagnosis not present

## 2020-03-24 DIAGNOSIS — J189 Pneumonia, unspecified organism: Secondary | ICD-10-CM | POA: Diagnosis not present

## 2020-03-25 DIAGNOSIS — E039 Hypothyroidism, unspecified: Secondary | ICD-10-CM | POA: Diagnosis not present

## 2020-03-25 DIAGNOSIS — G309 Alzheimer's disease, unspecified: Secondary | ICD-10-CM | POA: Diagnosis not present

## 2020-03-25 DIAGNOSIS — J189 Pneumonia, unspecified organism: Secondary | ICD-10-CM | POA: Diagnosis not present

## 2020-03-25 DIAGNOSIS — E785 Hyperlipidemia, unspecified: Secondary | ICD-10-CM | POA: Diagnosis not present

## 2020-03-25 DIAGNOSIS — M1990 Unspecified osteoarthritis, unspecified site: Secondary | ICD-10-CM | POA: Diagnosis not present

## 2020-03-25 DIAGNOSIS — N3946 Mixed incontinence: Secondary | ICD-10-CM | POA: Diagnosis not present

## 2020-03-28 DIAGNOSIS — E785 Hyperlipidemia, unspecified: Secondary | ICD-10-CM | POA: Diagnosis not present

## 2020-03-28 DIAGNOSIS — J189 Pneumonia, unspecified organism: Secondary | ICD-10-CM | POA: Diagnosis not present

## 2020-03-28 DIAGNOSIS — M1990 Unspecified osteoarthritis, unspecified site: Secondary | ICD-10-CM | POA: Diagnosis not present

## 2020-03-28 DIAGNOSIS — N3946 Mixed incontinence: Secondary | ICD-10-CM | POA: Diagnosis not present

## 2020-03-28 DIAGNOSIS — E039 Hypothyroidism, unspecified: Secondary | ICD-10-CM | POA: Diagnosis not present

## 2020-03-28 DIAGNOSIS — G309 Alzheimer's disease, unspecified: Secondary | ICD-10-CM | POA: Diagnosis not present

## 2020-03-29 DIAGNOSIS — E785 Hyperlipidemia, unspecified: Secondary | ICD-10-CM | POA: Diagnosis not present

## 2020-03-29 DIAGNOSIS — M1990 Unspecified osteoarthritis, unspecified site: Secondary | ICD-10-CM | POA: Diagnosis not present

## 2020-03-29 DIAGNOSIS — J189 Pneumonia, unspecified organism: Secondary | ICD-10-CM | POA: Diagnosis not present

## 2020-03-29 DIAGNOSIS — N3946 Mixed incontinence: Secondary | ICD-10-CM | POA: Diagnosis not present

## 2020-03-29 DIAGNOSIS — R0602 Shortness of breath: Secondary | ICD-10-CM | POA: Diagnosis not present

## 2020-03-29 DIAGNOSIS — E039 Hypothyroidism, unspecified: Secondary | ICD-10-CM | POA: Diagnosis not present

## 2020-03-29 DIAGNOSIS — G309 Alzheimer's disease, unspecified: Secondary | ICD-10-CM | POA: Diagnosis not present

## 2020-03-29 DIAGNOSIS — R531 Weakness: Secondary | ICD-10-CM | POA: Diagnosis not present

## 2020-03-30 DIAGNOSIS — G309 Alzheimer's disease, unspecified: Secondary | ICD-10-CM | POA: Diagnosis not present

## 2020-03-30 DIAGNOSIS — E785 Hyperlipidemia, unspecified: Secondary | ICD-10-CM | POA: Diagnosis not present

## 2020-03-30 DIAGNOSIS — J189 Pneumonia, unspecified organism: Secondary | ICD-10-CM | POA: Diagnosis not present

## 2020-03-30 DIAGNOSIS — N3946 Mixed incontinence: Secondary | ICD-10-CM | POA: Diagnosis not present

## 2020-03-30 DIAGNOSIS — E039 Hypothyroidism, unspecified: Secondary | ICD-10-CM | POA: Diagnosis not present

## 2020-03-30 DIAGNOSIS — M1990 Unspecified osteoarthritis, unspecified site: Secondary | ICD-10-CM | POA: Diagnosis not present

## 2020-03-31 DIAGNOSIS — E785 Hyperlipidemia, unspecified: Secondary | ICD-10-CM | POA: Diagnosis not present

## 2020-03-31 DIAGNOSIS — J189 Pneumonia, unspecified organism: Secondary | ICD-10-CM | POA: Diagnosis not present

## 2020-03-31 DIAGNOSIS — N3946 Mixed incontinence: Secondary | ICD-10-CM | POA: Diagnosis not present

## 2020-03-31 DIAGNOSIS — M1990 Unspecified osteoarthritis, unspecified site: Secondary | ICD-10-CM | POA: Diagnosis not present

## 2020-03-31 DIAGNOSIS — G309 Alzheimer's disease, unspecified: Secondary | ICD-10-CM | POA: Diagnosis not present

## 2020-03-31 DIAGNOSIS — E039 Hypothyroidism, unspecified: Secondary | ICD-10-CM | POA: Diagnosis not present

## 2020-04-01 DIAGNOSIS — E785 Hyperlipidemia, unspecified: Secondary | ICD-10-CM | POA: Diagnosis not present

## 2020-04-01 DIAGNOSIS — J189 Pneumonia, unspecified organism: Secondary | ICD-10-CM | POA: Diagnosis not present

## 2020-04-01 DIAGNOSIS — E039 Hypothyroidism, unspecified: Secondary | ICD-10-CM | POA: Diagnosis not present

## 2020-04-01 DIAGNOSIS — G309 Alzheimer's disease, unspecified: Secondary | ICD-10-CM | POA: Diagnosis not present

## 2020-04-01 DIAGNOSIS — N3946 Mixed incontinence: Secondary | ICD-10-CM | POA: Diagnosis not present

## 2020-04-01 DIAGNOSIS — M1990 Unspecified osteoarthritis, unspecified site: Secondary | ICD-10-CM | POA: Diagnosis not present

## 2020-04-04 DIAGNOSIS — E785 Hyperlipidemia, unspecified: Secondary | ICD-10-CM | POA: Diagnosis not present

## 2020-04-04 DIAGNOSIS — G309 Alzheimer's disease, unspecified: Secondary | ICD-10-CM | POA: Diagnosis not present

## 2020-04-04 DIAGNOSIS — J189 Pneumonia, unspecified organism: Secondary | ICD-10-CM | POA: Diagnosis not present

## 2020-04-04 DIAGNOSIS — E039 Hypothyroidism, unspecified: Secondary | ICD-10-CM | POA: Diagnosis not present

## 2020-04-04 DIAGNOSIS — M1990 Unspecified osteoarthritis, unspecified site: Secondary | ICD-10-CM | POA: Diagnosis not present

## 2020-04-04 DIAGNOSIS — N3946 Mixed incontinence: Secondary | ICD-10-CM | POA: Diagnosis not present

## 2020-04-05 DIAGNOSIS — E785 Hyperlipidemia, unspecified: Secondary | ICD-10-CM | POA: Diagnosis not present

## 2020-04-05 DIAGNOSIS — M1990 Unspecified osteoarthritis, unspecified site: Secondary | ICD-10-CM | POA: Diagnosis not present

## 2020-04-05 DIAGNOSIS — N3946 Mixed incontinence: Secondary | ICD-10-CM | POA: Diagnosis not present

## 2020-04-05 DIAGNOSIS — E039 Hypothyroidism, unspecified: Secondary | ICD-10-CM | POA: Diagnosis not present

## 2020-04-05 DIAGNOSIS — J189 Pneumonia, unspecified organism: Secondary | ICD-10-CM | POA: Diagnosis not present

## 2020-04-05 DIAGNOSIS — G309 Alzheimer's disease, unspecified: Secondary | ICD-10-CM | POA: Diagnosis not present

## 2020-04-06 DIAGNOSIS — J189 Pneumonia, unspecified organism: Secondary | ICD-10-CM | POA: Diagnosis not present

## 2020-04-06 DIAGNOSIS — N3946 Mixed incontinence: Secondary | ICD-10-CM | POA: Diagnosis not present

## 2020-04-06 DIAGNOSIS — M1990 Unspecified osteoarthritis, unspecified site: Secondary | ICD-10-CM | POA: Diagnosis not present

## 2020-04-06 DIAGNOSIS — G309 Alzheimer's disease, unspecified: Secondary | ICD-10-CM | POA: Diagnosis not present

## 2020-04-06 DIAGNOSIS — E785 Hyperlipidemia, unspecified: Secondary | ICD-10-CM | POA: Diagnosis not present

## 2020-04-06 DIAGNOSIS — E039 Hypothyroidism, unspecified: Secondary | ICD-10-CM | POA: Diagnosis not present

## 2020-04-08 DIAGNOSIS — J189 Pneumonia, unspecified organism: Secondary | ICD-10-CM | POA: Diagnosis not present

## 2020-04-08 DIAGNOSIS — N3946 Mixed incontinence: Secondary | ICD-10-CM | POA: Diagnosis not present

## 2020-04-08 DIAGNOSIS — E039 Hypothyroidism, unspecified: Secondary | ICD-10-CM | POA: Diagnosis not present

## 2020-04-08 DIAGNOSIS — G309 Alzheimer's disease, unspecified: Secondary | ICD-10-CM | POA: Diagnosis not present

## 2020-04-08 DIAGNOSIS — E785 Hyperlipidemia, unspecified: Secondary | ICD-10-CM | POA: Diagnosis not present

## 2020-04-08 DIAGNOSIS — M1990 Unspecified osteoarthritis, unspecified site: Secondary | ICD-10-CM | POA: Diagnosis not present

## 2020-04-11 DIAGNOSIS — E039 Hypothyroidism, unspecified: Secondary | ICD-10-CM | POA: Diagnosis not present

## 2020-04-11 DIAGNOSIS — M1990 Unspecified osteoarthritis, unspecified site: Secondary | ICD-10-CM | POA: Diagnosis not present

## 2020-04-11 DIAGNOSIS — N3946 Mixed incontinence: Secondary | ICD-10-CM | POA: Diagnosis not present

## 2020-04-11 DIAGNOSIS — E785 Hyperlipidemia, unspecified: Secondary | ICD-10-CM | POA: Diagnosis not present

## 2020-04-11 DIAGNOSIS — G309 Alzheimer's disease, unspecified: Secondary | ICD-10-CM | POA: Diagnosis not present

## 2020-04-11 DIAGNOSIS — J189 Pneumonia, unspecified organism: Secondary | ICD-10-CM | POA: Diagnosis not present

## 2020-04-12 DIAGNOSIS — M1990 Unspecified osteoarthritis, unspecified site: Secondary | ICD-10-CM | POA: Diagnosis not present

## 2020-04-12 DIAGNOSIS — N3946 Mixed incontinence: Secondary | ICD-10-CM | POA: Diagnosis not present

## 2020-04-12 DIAGNOSIS — G309 Alzheimer's disease, unspecified: Secondary | ICD-10-CM | POA: Diagnosis not present

## 2020-04-12 DIAGNOSIS — E039 Hypothyroidism, unspecified: Secondary | ICD-10-CM | POA: Diagnosis not present

## 2020-04-12 DIAGNOSIS — E785 Hyperlipidemia, unspecified: Secondary | ICD-10-CM | POA: Diagnosis not present

## 2020-04-12 DIAGNOSIS — J189 Pneumonia, unspecified organism: Secondary | ICD-10-CM | POA: Diagnosis not present

## 2020-04-13 DIAGNOSIS — N3946 Mixed incontinence: Secondary | ICD-10-CM | POA: Diagnosis not present

## 2020-04-13 DIAGNOSIS — J189 Pneumonia, unspecified organism: Secondary | ICD-10-CM | POA: Diagnosis not present

## 2020-04-13 DIAGNOSIS — M1990 Unspecified osteoarthritis, unspecified site: Secondary | ICD-10-CM | POA: Diagnosis not present

## 2020-04-13 DIAGNOSIS — G309 Alzheimer's disease, unspecified: Secondary | ICD-10-CM | POA: Diagnosis not present

## 2020-04-13 DIAGNOSIS — E039 Hypothyroidism, unspecified: Secondary | ICD-10-CM | POA: Diagnosis not present

## 2020-04-13 DIAGNOSIS — E785 Hyperlipidemia, unspecified: Secondary | ICD-10-CM | POA: Diagnosis not present

## 2020-04-14 DIAGNOSIS — M1990 Unspecified osteoarthritis, unspecified site: Secondary | ICD-10-CM | POA: Diagnosis not present

## 2020-04-14 DIAGNOSIS — J189 Pneumonia, unspecified organism: Secondary | ICD-10-CM | POA: Diagnosis not present

## 2020-04-14 DIAGNOSIS — E039 Hypothyroidism, unspecified: Secondary | ICD-10-CM | POA: Diagnosis not present

## 2020-04-14 DIAGNOSIS — N3946 Mixed incontinence: Secondary | ICD-10-CM | POA: Diagnosis not present

## 2020-04-14 DIAGNOSIS — E785 Hyperlipidemia, unspecified: Secondary | ICD-10-CM | POA: Diagnosis not present

## 2020-04-14 DIAGNOSIS — G309 Alzheimer's disease, unspecified: Secondary | ICD-10-CM | POA: Diagnosis not present

## 2020-04-15 DIAGNOSIS — E785 Hyperlipidemia, unspecified: Secondary | ICD-10-CM | POA: Diagnosis not present

## 2020-04-15 DIAGNOSIS — E039 Hypothyroidism, unspecified: Secondary | ICD-10-CM | POA: Diagnosis not present

## 2020-04-15 DIAGNOSIS — N3946 Mixed incontinence: Secondary | ICD-10-CM | POA: Diagnosis not present

## 2020-04-15 DIAGNOSIS — M1990 Unspecified osteoarthritis, unspecified site: Secondary | ICD-10-CM | POA: Diagnosis not present

## 2020-04-15 DIAGNOSIS — G309 Alzheimer's disease, unspecified: Secondary | ICD-10-CM | POA: Diagnosis not present

## 2020-04-15 DIAGNOSIS — J189 Pneumonia, unspecified organism: Secondary | ICD-10-CM | POA: Diagnosis not present

## 2020-04-19 DIAGNOSIS — G309 Alzheimer's disease, unspecified: Secondary | ICD-10-CM | POA: Diagnosis not present

## 2020-04-19 DIAGNOSIS — M1990 Unspecified osteoarthritis, unspecified site: Secondary | ICD-10-CM | POA: Diagnosis not present

## 2020-04-19 DIAGNOSIS — E785 Hyperlipidemia, unspecified: Secondary | ICD-10-CM | POA: Diagnosis not present

## 2020-04-19 DIAGNOSIS — E039 Hypothyroidism, unspecified: Secondary | ICD-10-CM | POA: Diagnosis not present

## 2020-04-19 DIAGNOSIS — J189 Pneumonia, unspecified organism: Secondary | ICD-10-CM | POA: Diagnosis not present

## 2020-04-19 DIAGNOSIS — N3946 Mixed incontinence: Secondary | ICD-10-CM | POA: Diagnosis not present

## 2020-04-20 DIAGNOSIS — G309 Alzheimer's disease, unspecified: Secondary | ICD-10-CM | POA: Diagnosis not present

## 2020-04-20 DIAGNOSIS — M1990 Unspecified osteoarthritis, unspecified site: Secondary | ICD-10-CM | POA: Diagnosis not present

## 2020-04-20 DIAGNOSIS — E785 Hyperlipidemia, unspecified: Secondary | ICD-10-CM | POA: Diagnosis not present

## 2020-04-20 DIAGNOSIS — N3946 Mixed incontinence: Secondary | ICD-10-CM | POA: Diagnosis not present

## 2020-04-20 DIAGNOSIS — J189 Pneumonia, unspecified organism: Secondary | ICD-10-CM | POA: Diagnosis not present

## 2020-04-20 DIAGNOSIS — E039 Hypothyroidism, unspecified: Secondary | ICD-10-CM | POA: Diagnosis not present

## 2020-04-21 DIAGNOSIS — E039 Hypothyroidism, unspecified: Secondary | ICD-10-CM | POA: Diagnosis not present

## 2020-04-21 DIAGNOSIS — E785 Hyperlipidemia, unspecified: Secondary | ICD-10-CM | POA: Diagnosis not present

## 2020-04-21 DIAGNOSIS — N3946 Mixed incontinence: Secondary | ICD-10-CM | POA: Diagnosis not present

## 2020-04-21 DIAGNOSIS — G309 Alzheimer's disease, unspecified: Secondary | ICD-10-CM | POA: Diagnosis not present

## 2020-04-21 DIAGNOSIS — M1990 Unspecified osteoarthritis, unspecified site: Secondary | ICD-10-CM | POA: Diagnosis not present

## 2020-04-21 DIAGNOSIS — J189 Pneumonia, unspecified organism: Secondary | ICD-10-CM | POA: Diagnosis not present

## 2020-04-22 DIAGNOSIS — N3946 Mixed incontinence: Secondary | ICD-10-CM | POA: Diagnosis not present

## 2020-04-22 DIAGNOSIS — E785 Hyperlipidemia, unspecified: Secondary | ICD-10-CM | POA: Diagnosis not present

## 2020-04-22 DIAGNOSIS — M1990 Unspecified osteoarthritis, unspecified site: Secondary | ICD-10-CM | POA: Diagnosis not present

## 2020-04-22 DIAGNOSIS — E039 Hypothyroidism, unspecified: Secondary | ICD-10-CM | POA: Diagnosis not present

## 2020-04-22 DIAGNOSIS — G309 Alzheimer's disease, unspecified: Secondary | ICD-10-CM | POA: Diagnosis not present

## 2020-04-22 DIAGNOSIS — J189 Pneumonia, unspecified organism: Secondary | ICD-10-CM | POA: Diagnosis not present

## 2020-04-25 DIAGNOSIS — G309 Alzheimer's disease, unspecified: Secondary | ICD-10-CM | POA: Diagnosis not present

## 2020-04-25 DIAGNOSIS — E039 Hypothyroidism, unspecified: Secondary | ICD-10-CM | POA: Diagnosis not present

## 2020-04-25 DIAGNOSIS — M1990 Unspecified osteoarthritis, unspecified site: Secondary | ICD-10-CM | POA: Diagnosis not present

## 2020-04-25 DIAGNOSIS — E785 Hyperlipidemia, unspecified: Secondary | ICD-10-CM | POA: Diagnosis not present

## 2020-04-25 DIAGNOSIS — N3946 Mixed incontinence: Secondary | ICD-10-CM | POA: Diagnosis not present

## 2020-04-25 DIAGNOSIS — J189 Pneumonia, unspecified organism: Secondary | ICD-10-CM | POA: Diagnosis not present

## 2020-04-26 DIAGNOSIS — M1990 Unspecified osteoarthritis, unspecified site: Secondary | ICD-10-CM | POA: Diagnosis not present

## 2020-04-26 DIAGNOSIS — N3946 Mixed incontinence: Secondary | ICD-10-CM | POA: Diagnosis not present

## 2020-04-26 DIAGNOSIS — G309 Alzheimer's disease, unspecified: Secondary | ICD-10-CM | POA: Diagnosis not present

## 2020-04-26 DIAGNOSIS — E039 Hypothyroidism, unspecified: Secondary | ICD-10-CM | POA: Diagnosis not present

## 2020-04-26 DIAGNOSIS — J189 Pneumonia, unspecified organism: Secondary | ICD-10-CM | POA: Diagnosis not present

## 2020-04-26 DIAGNOSIS — E785 Hyperlipidemia, unspecified: Secondary | ICD-10-CM | POA: Diagnosis not present

## 2020-04-27 DIAGNOSIS — M1990 Unspecified osteoarthritis, unspecified site: Secondary | ICD-10-CM | POA: Diagnosis not present

## 2020-04-27 DIAGNOSIS — N3946 Mixed incontinence: Secondary | ICD-10-CM | POA: Diagnosis not present

## 2020-04-27 DIAGNOSIS — E039 Hypothyroidism, unspecified: Secondary | ICD-10-CM | POA: Diagnosis not present

## 2020-04-27 DIAGNOSIS — E785 Hyperlipidemia, unspecified: Secondary | ICD-10-CM | POA: Diagnosis not present

## 2020-04-27 DIAGNOSIS — J189 Pneumonia, unspecified organism: Secondary | ICD-10-CM | POA: Diagnosis not present

## 2020-04-27 DIAGNOSIS — G309 Alzheimer's disease, unspecified: Secondary | ICD-10-CM | POA: Diagnosis not present

## 2020-04-28 DIAGNOSIS — E039 Hypothyroidism, unspecified: Secondary | ICD-10-CM | POA: Diagnosis not present

## 2020-04-28 DIAGNOSIS — R531 Weakness: Secondary | ICD-10-CM | POA: Diagnosis not present

## 2020-04-28 DIAGNOSIS — M1990 Unspecified osteoarthritis, unspecified site: Secondary | ICD-10-CM | POA: Diagnosis not present

## 2020-04-28 DIAGNOSIS — G309 Alzheimer's disease, unspecified: Secondary | ICD-10-CM | POA: Diagnosis not present

## 2020-04-28 DIAGNOSIS — N3946 Mixed incontinence: Secondary | ICD-10-CM | POA: Diagnosis not present

## 2020-04-28 DIAGNOSIS — E785 Hyperlipidemia, unspecified: Secondary | ICD-10-CM | POA: Diagnosis not present

## 2020-04-28 DIAGNOSIS — J189 Pneumonia, unspecified organism: Secondary | ICD-10-CM | POA: Diagnosis not present

## 2020-04-28 DIAGNOSIS — R0602 Shortness of breath: Secondary | ICD-10-CM | POA: Diagnosis not present

## 2020-05-03 DIAGNOSIS — E785 Hyperlipidemia, unspecified: Secondary | ICD-10-CM | POA: Diagnosis not present

## 2020-05-03 DIAGNOSIS — M1990 Unspecified osteoarthritis, unspecified site: Secondary | ICD-10-CM | POA: Diagnosis not present

## 2020-05-03 DIAGNOSIS — N3946 Mixed incontinence: Secondary | ICD-10-CM | POA: Diagnosis not present

## 2020-05-03 DIAGNOSIS — G309 Alzheimer's disease, unspecified: Secondary | ICD-10-CM | POA: Diagnosis not present

## 2020-05-03 DIAGNOSIS — E039 Hypothyroidism, unspecified: Secondary | ICD-10-CM | POA: Diagnosis not present

## 2020-05-03 DIAGNOSIS — J189 Pneumonia, unspecified organism: Secondary | ICD-10-CM | POA: Diagnosis not present

## 2020-05-04 DIAGNOSIS — G309 Alzheimer's disease, unspecified: Secondary | ICD-10-CM | POA: Diagnosis not present

## 2020-05-04 DIAGNOSIS — M1990 Unspecified osteoarthritis, unspecified site: Secondary | ICD-10-CM | POA: Diagnosis not present

## 2020-05-04 DIAGNOSIS — E039 Hypothyroidism, unspecified: Secondary | ICD-10-CM | POA: Diagnosis not present

## 2020-05-04 DIAGNOSIS — N3946 Mixed incontinence: Secondary | ICD-10-CM | POA: Diagnosis not present

## 2020-05-04 DIAGNOSIS — J189 Pneumonia, unspecified organism: Secondary | ICD-10-CM | POA: Diagnosis not present

## 2020-05-04 DIAGNOSIS — E785 Hyperlipidemia, unspecified: Secondary | ICD-10-CM | POA: Diagnosis not present

## 2020-05-06 DIAGNOSIS — E785 Hyperlipidemia, unspecified: Secondary | ICD-10-CM | POA: Diagnosis not present

## 2020-05-06 DIAGNOSIS — J189 Pneumonia, unspecified organism: Secondary | ICD-10-CM | POA: Diagnosis not present

## 2020-05-06 DIAGNOSIS — E039 Hypothyroidism, unspecified: Secondary | ICD-10-CM | POA: Diagnosis not present

## 2020-05-06 DIAGNOSIS — M1990 Unspecified osteoarthritis, unspecified site: Secondary | ICD-10-CM | POA: Diagnosis not present

## 2020-05-06 DIAGNOSIS — G309 Alzheimer's disease, unspecified: Secondary | ICD-10-CM | POA: Diagnosis not present

## 2020-05-06 DIAGNOSIS — N3946 Mixed incontinence: Secondary | ICD-10-CM | POA: Diagnosis not present

## 2020-05-09 DIAGNOSIS — M1990 Unspecified osteoarthritis, unspecified site: Secondary | ICD-10-CM | POA: Diagnosis not present

## 2020-05-09 DIAGNOSIS — N3946 Mixed incontinence: Secondary | ICD-10-CM | POA: Diagnosis not present

## 2020-05-09 DIAGNOSIS — E039 Hypothyroidism, unspecified: Secondary | ICD-10-CM | POA: Diagnosis not present

## 2020-05-09 DIAGNOSIS — J189 Pneumonia, unspecified organism: Secondary | ICD-10-CM | POA: Diagnosis not present

## 2020-05-09 DIAGNOSIS — E785 Hyperlipidemia, unspecified: Secondary | ICD-10-CM | POA: Diagnosis not present

## 2020-05-09 DIAGNOSIS — G309 Alzheimer's disease, unspecified: Secondary | ICD-10-CM | POA: Diagnosis not present

## 2020-05-10 DIAGNOSIS — E039 Hypothyroidism, unspecified: Secondary | ICD-10-CM | POA: Diagnosis not present

## 2020-05-10 DIAGNOSIS — J189 Pneumonia, unspecified organism: Secondary | ICD-10-CM | POA: Diagnosis not present

## 2020-05-10 DIAGNOSIS — N3946 Mixed incontinence: Secondary | ICD-10-CM | POA: Diagnosis not present

## 2020-05-10 DIAGNOSIS — E785 Hyperlipidemia, unspecified: Secondary | ICD-10-CM | POA: Diagnosis not present

## 2020-05-10 DIAGNOSIS — M1990 Unspecified osteoarthritis, unspecified site: Secondary | ICD-10-CM | POA: Diagnosis not present

## 2020-05-10 DIAGNOSIS — G309 Alzheimer's disease, unspecified: Secondary | ICD-10-CM | POA: Diagnosis not present

## 2020-05-11 DIAGNOSIS — E785 Hyperlipidemia, unspecified: Secondary | ICD-10-CM | POA: Diagnosis not present

## 2020-05-11 DIAGNOSIS — E039 Hypothyroidism, unspecified: Secondary | ICD-10-CM | POA: Diagnosis not present

## 2020-05-11 DIAGNOSIS — N3946 Mixed incontinence: Secondary | ICD-10-CM | POA: Diagnosis not present

## 2020-05-11 DIAGNOSIS — M1990 Unspecified osteoarthritis, unspecified site: Secondary | ICD-10-CM | POA: Diagnosis not present

## 2020-05-11 DIAGNOSIS — J189 Pneumonia, unspecified organism: Secondary | ICD-10-CM | POA: Diagnosis not present

## 2020-05-11 DIAGNOSIS — G309 Alzheimer's disease, unspecified: Secondary | ICD-10-CM | POA: Diagnosis not present

## 2020-05-12 DIAGNOSIS — M1990 Unspecified osteoarthritis, unspecified site: Secondary | ICD-10-CM | POA: Diagnosis not present

## 2020-05-12 DIAGNOSIS — J189 Pneumonia, unspecified organism: Secondary | ICD-10-CM | POA: Diagnosis not present

## 2020-05-12 DIAGNOSIS — E785 Hyperlipidemia, unspecified: Secondary | ICD-10-CM | POA: Diagnosis not present

## 2020-05-12 DIAGNOSIS — G309 Alzheimer's disease, unspecified: Secondary | ICD-10-CM | POA: Diagnosis not present

## 2020-05-12 DIAGNOSIS — E039 Hypothyroidism, unspecified: Secondary | ICD-10-CM | POA: Diagnosis not present

## 2020-05-12 DIAGNOSIS — N3946 Mixed incontinence: Secondary | ICD-10-CM | POA: Diagnosis not present

## 2020-05-13 DIAGNOSIS — G309 Alzheimer's disease, unspecified: Secondary | ICD-10-CM | POA: Diagnosis not present

## 2020-05-13 DIAGNOSIS — J189 Pneumonia, unspecified organism: Secondary | ICD-10-CM | POA: Diagnosis not present

## 2020-05-13 DIAGNOSIS — M1990 Unspecified osteoarthritis, unspecified site: Secondary | ICD-10-CM | POA: Diagnosis not present

## 2020-05-13 DIAGNOSIS — N3946 Mixed incontinence: Secondary | ICD-10-CM | POA: Diagnosis not present

## 2020-05-13 DIAGNOSIS — E785 Hyperlipidemia, unspecified: Secondary | ICD-10-CM | POA: Diagnosis not present

## 2020-05-13 DIAGNOSIS — E039 Hypothyroidism, unspecified: Secondary | ICD-10-CM | POA: Diagnosis not present

## 2020-05-17 DIAGNOSIS — E039 Hypothyroidism, unspecified: Secondary | ICD-10-CM | POA: Diagnosis not present

## 2020-05-17 DIAGNOSIS — E785 Hyperlipidemia, unspecified: Secondary | ICD-10-CM | POA: Diagnosis not present

## 2020-05-17 DIAGNOSIS — G309 Alzheimer's disease, unspecified: Secondary | ICD-10-CM | POA: Diagnosis not present

## 2020-05-17 DIAGNOSIS — N3946 Mixed incontinence: Secondary | ICD-10-CM | POA: Diagnosis not present

## 2020-05-17 DIAGNOSIS — M1990 Unspecified osteoarthritis, unspecified site: Secondary | ICD-10-CM | POA: Diagnosis not present

## 2020-05-17 DIAGNOSIS — J189 Pneumonia, unspecified organism: Secondary | ICD-10-CM | POA: Diagnosis not present

## 2020-05-19 DIAGNOSIS — G309 Alzheimer's disease, unspecified: Secondary | ICD-10-CM | POA: Diagnosis not present

## 2020-05-19 DIAGNOSIS — J189 Pneumonia, unspecified organism: Secondary | ICD-10-CM | POA: Diagnosis not present

## 2020-05-19 DIAGNOSIS — E785 Hyperlipidemia, unspecified: Secondary | ICD-10-CM | POA: Diagnosis not present

## 2020-05-19 DIAGNOSIS — N3946 Mixed incontinence: Secondary | ICD-10-CM | POA: Diagnosis not present

## 2020-05-19 DIAGNOSIS — M1990 Unspecified osteoarthritis, unspecified site: Secondary | ICD-10-CM | POA: Diagnosis not present

## 2020-05-19 DIAGNOSIS — E039 Hypothyroidism, unspecified: Secondary | ICD-10-CM | POA: Diagnosis not present

## 2020-05-24 DIAGNOSIS — E039 Hypothyroidism, unspecified: Secondary | ICD-10-CM | POA: Diagnosis not present

## 2020-05-24 DIAGNOSIS — G309 Alzheimer's disease, unspecified: Secondary | ICD-10-CM | POA: Diagnosis not present

## 2020-05-24 DIAGNOSIS — J189 Pneumonia, unspecified organism: Secondary | ICD-10-CM | POA: Diagnosis not present

## 2020-05-24 DIAGNOSIS — E785 Hyperlipidemia, unspecified: Secondary | ICD-10-CM | POA: Diagnosis not present

## 2020-05-24 DIAGNOSIS — N3946 Mixed incontinence: Secondary | ICD-10-CM | POA: Diagnosis not present

## 2020-05-24 DIAGNOSIS — M1990 Unspecified osteoarthritis, unspecified site: Secondary | ICD-10-CM | POA: Diagnosis not present

## 2020-05-26 DIAGNOSIS — M1990 Unspecified osteoarthritis, unspecified site: Secondary | ICD-10-CM | POA: Diagnosis not present

## 2020-05-26 DIAGNOSIS — J189 Pneumonia, unspecified organism: Secondary | ICD-10-CM | POA: Diagnosis not present

## 2020-05-26 DIAGNOSIS — N3946 Mixed incontinence: Secondary | ICD-10-CM | POA: Diagnosis not present

## 2020-05-26 DIAGNOSIS — G309 Alzheimer's disease, unspecified: Secondary | ICD-10-CM | POA: Diagnosis not present

## 2020-05-26 DIAGNOSIS — E785 Hyperlipidemia, unspecified: Secondary | ICD-10-CM | POA: Diagnosis not present

## 2020-05-26 DIAGNOSIS — E039 Hypothyroidism, unspecified: Secondary | ICD-10-CM | POA: Diagnosis not present

## 2020-05-29 DIAGNOSIS — R0602 Shortness of breath: Secondary | ICD-10-CM | POA: Diagnosis not present

## 2020-05-29 DIAGNOSIS — E785 Hyperlipidemia, unspecified: Secondary | ICD-10-CM | POA: Diagnosis not present

## 2020-05-29 DIAGNOSIS — E039 Hypothyroidism, unspecified: Secondary | ICD-10-CM | POA: Diagnosis not present

## 2020-05-29 DIAGNOSIS — N3946 Mixed incontinence: Secondary | ICD-10-CM | POA: Diagnosis not present

## 2020-05-29 DIAGNOSIS — J189 Pneumonia, unspecified organism: Secondary | ICD-10-CM | POA: Diagnosis not present

## 2020-05-29 DIAGNOSIS — G309 Alzheimer's disease, unspecified: Secondary | ICD-10-CM | POA: Diagnosis not present

## 2020-05-29 DIAGNOSIS — R531 Weakness: Secondary | ICD-10-CM | POA: Diagnosis not present

## 2020-05-29 DIAGNOSIS — M1990 Unspecified osteoarthritis, unspecified site: Secondary | ICD-10-CM | POA: Diagnosis not present

## 2020-05-30 DIAGNOSIS — J189 Pneumonia, unspecified organism: Secondary | ICD-10-CM | POA: Diagnosis not present

## 2020-05-30 DIAGNOSIS — E039 Hypothyroidism, unspecified: Secondary | ICD-10-CM | POA: Diagnosis not present

## 2020-05-30 DIAGNOSIS — N3946 Mixed incontinence: Secondary | ICD-10-CM | POA: Diagnosis not present

## 2020-05-30 DIAGNOSIS — M1990 Unspecified osteoarthritis, unspecified site: Secondary | ICD-10-CM | POA: Diagnosis not present

## 2020-05-30 DIAGNOSIS — G309 Alzheimer's disease, unspecified: Secondary | ICD-10-CM | POA: Diagnosis not present

## 2020-05-30 DIAGNOSIS — E785 Hyperlipidemia, unspecified: Secondary | ICD-10-CM | POA: Diagnosis not present

## 2020-05-31 DIAGNOSIS — N3946 Mixed incontinence: Secondary | ICD-10-CM | POA: Diagnosis not present

## 2020-05-31 DIAGNOSIS — J189 Pneumonia, unspecified organism: Secondary | ICD-10-CM | POA: Diagnosis not present

## 2020-05-31 DIAGNOSIS — G309 Alzheimer's disease, unspecified: Secondary | ICD-10-CM | POA: Diagnosis not present

## 2020-05-31 DIAGNOSIS — M1990 Unspecified osteoarthritis, unspecified site: Secondary | ICD-10-CM | POA: Diagnosis not present

## 2020-05-31 DIAGNOSIS — E785 Hyperlipidemia, unspecified: Secondary | ICD-10-CM | POA: Diagnosis not present

## 2020-05-31 DIAGNOSIS — E039 Hypothyroidism, unspecified: Secondary | ICD-10-CM | POA: Diagnosis not present

## 2020-06-01 DIAGNOSIS — J189 Pneumonia, unspecified organism: Secondary | ICD-10-CM | POA: Diagnosis not present

## 2020-06-01 DIAGNOSIS — E039 Hypothyroidism, unspecified: Secondary | ICD-10-CM | POA: Diagnosis not present

## 2020-06-01 DIAGNOSIS — E785 Hyperlipidemia, unspecified: Secondary | ICD-10-CM | POA: Diagnosis not present

## 2020-06-01 DIAGNOSIS — M1990 Unspecified osteoarthritis, unspecified site: Secondary | ICD-10-CM | POA: Diagnosis not present

## 2020-06-01 DIAGNOSIS — N3946 Mixed incontinence: Secondary | ICD-10-CM | POA: Diagnosis not present

## 2020-06-01 DIAGNOSIS — G309 Alzheimer's disease, unspecified: Secondary | ICD-10-CM | POA: Diagnosis not present

## 2020-06-03 DIAGNOSIS — M1990 Unspecified osteoarthritis, unspecified site: Secondary | ICD-10-CM | POA: Diagnosis not present

## 2020-06-03 DIAGNOSIS — E785 Hyperlipidemia, unspecified: Secondary | ICD-10-CM | POA: Diagnosis not present

## 2020-06-03 DIAGNOSIS — G309 Alzheimer's disease, unspecified: Secondary | ICD-10-CM | POA: Diagnosis not present

## 2020-06-03 DIAGNOSIS — J189 Pneumonia, unspecified organism: Secondary | ICD-10-CM | POA: Diagnosis not present

## 2020-06-03 DIAGNOSIS — N3946 Mixed incontinence: Secondary | ICD-10-CM | POA: Diagnosis not present

## 2020-06-03 DIAGNOSIS — E039 Hypothyroidism, unspecified: Secondary | ICD-10-CM | POA: Diagnosis not present

## 2020-06-06 DIAGNOSIS — M1990 Unspecified osteoarthritis, unspecified site: Secondary | ICD-10-CM | POA: Diagnosis not present

## 2020-06-06 DIAGNOSIS — N3946 Mixed incontinence: Secondary | ICD-10-CM | POA: Diagnosis not present

## 2020-06-06 DIAGNOSIS — E039 Hypothyroidism, unspecified: Secondary | ICD-10-CM | POA: Diagnosis not present

## 2020-06-06 DIAGNOSIS — J189 Pneumonia, unspecified organism: Secondary | ICD-10-CM | POA: Diagnosis not present

## 2020-06-06 DIAGNOSIS — G309 Alzheimer's disease, unspecified: Secondary | ICD-10-CM | POA: Diagnosis not present

## 2020-06-06 DIAGNOSIS — E785 Hyperlipidemia, unspecified: Secondary | ICD-10-CM | POA: Diagnosis not present

## 2020-06-07 DIAGNOSIS — G309 Alzheimer's disease, unspecified: Secondary | ICD-10-CM | POA: Diagnosis not present

## 2020-06-07 DIAGNOSIS — E785 Hyperlipidemia, unspecified: Secondary | ICD-10-CM | POA: Diagnosis not present

## 2020-06-07 DIAGNOSIS — N3946 Mixed incontinence: Secondary | ICD-10-CM | POA: Diagnosis not present

## 2020-06-07 DIAGNOSIS — M1990 Unspecified osteoarthritis, unspecified site: Secondary | ICD-10-CM | POA: Diagnosis not present

## 2020-06-07 DIAGNOSIS — E039 Hypothyroidism, unspecified: Secondary | ICD-10-CM | POA: Diagnosis not present

## 2020-06-07 DIAGNOSIS — J189 Pneumonia, unspecified organism: Secondary | ICD-10-CM | POA: Diagnosis not present

## 2020-06-08 DIAGNOSIS — E039 Hypothyroidism, unspecified: Secondary | ICD-10-CM | POA: Diagnosis not present

## 2020-06-08 DIAGNOSIS — E785 Hyperlipidemia, unspecified: Secondary | ICD-10-CM | POA: Diagnosis not present

## 2020-06-08 DIAGNOSIS — M1990 Unspecified osteoarthritis, unspecified site: Secondary | ICD-10-CM | POA: Diagnosis not present

## 2020-06-08 DIAGNOSIS — J189 Pneumonia, unspecified organism: Secondary | ICD-10-CM | POA: Diagnosis not present

## 2020-06-08 DIAGNOSIS — N3946 Mixed incontinence: Secondary | ICD-10-CM | POA: Diagnosis not present

## 2020-06-08 DIAGNOSIS — G309 Alzheimer's disease, unspecified: Secondary | ICD-10-CM | POA: Diagnosis not present

## 2020-06-09 DIAGNOSIS — M1990 Unspecified osteoarthritis, unspecified site: Secondary | ICD-10-CM | POA: Diagnosis not present

## 2020-06-09 DIAGNOSIS — E785 Hyperlipidemia, unspecified: Secondary | ICD-10-CM | POA: Diagnosis not present

## 2020-06-09 DIAGNOSIS — J189 Pneumonia, unspecified organism: Secondary | ICD-10-CM | POA: Diagnosis not present

## 2020-06-09 DIAGNOSIS — G309 Alzheimer's disease, unspecified: Secondary | ICD-10-CM | POA: Diagnosis not present

## 2020-06-09 DIAGNOSIS — E039 Hypothyroidism, unspecified: Secondary | ICD-10-CM | POA: Diagnosis not present

## 2020-06-09 DIAGNOSIS — N3946 Mixed incontinence: Secondary | ICD-10-CM | POA: Diagnosis not present

## 2020-06-10 DIAGNOSIS — N3946 Mixed incontinence: Secondary | ICD-10-CM | POA: Diagnosis not present

## 2020-06-10 DIAGNOSIS — J189 Pneumonia, unspecified organism: Secondary | ICD-10-CM | POA: Diagnosis not present

## 2020-06-10 DIAGNOSIS — E785 Hyperlipidemia, unspecified: Secondary | ICD-10-CM | POA: Diagnosis not present

## 2020-06-10 DIAGNOSIS — G309 Alzheimer's disease, unspecified: Secondary | ICD-10-CM | POA: Diagnosis not present

## 2020-06-10 DIAGNOSIS — M1990 Unspecified osteoarthritis, unspecified site: Secondary | ICD-10-CM | POA: Diagnosis not present

## 2020-06-10 DIAGNOSIS — E039 Hypothyroidism, unspecified: Secondary | ICD-10-CM | POA: Diagnosis not present

## 2020-06-13 DIAGNOSIS — M1990 Unspecified osteoarthritis, unspecified site: Secondary | ICD-10-CM | POA: Diagnosis not present

## 2020-06-13 DIAGNOSIS — N3946 Mixed incontinence: Secondary | ICD-10-CM | POA: Diagnosis not present

## 2020-06-13 DIAGNOSIS — E039 Hypothyroidism, unspecified: Secondary | ICD-10-CM | POA: Diagnosis not present

## 2020-06-13 DIAGNOSIS — J189 Pneumonia, unspecified organism: Secondary | ICD-10-CM | POA: Diagnosis not present

## 2020-06-13 DIAGNOSIS — G309 Alzheimer's disease, unspecified: Secondary | ICD-10-CM | POA: Diagnosis not present

## 2020-06-13 DIAGNOSIS — E785 Hyperlipidemia, unspecified: Secondary | ICD-10-CM | POA: Diagnosis not present

## 2020-06-14 DIAGNOSIS — E039 Hypothyroidism, unspecified: Secondary | ICD-10-CM | POA: Diagnosis not present

## 2020-06-14 DIAGNOSIS — M1990 Unspecified osteoarthritis, unspecified site: Secondary | ICD-10-CM | POA: Diagnosis not present

## 2020-06-14 DIAGNOSIS — J189 Pneumonia, unspecified organism: Secondary | ICD-10-CM | POA: Diagnosis not present

## 2020-06-14 DIAGNOSIS — E785 Hyperlipidemia, unspecified: Secondary | ICD-10-CM | POA: Diagnosis not present

## 2020-06-14 DIAGNOSIS — N3946 Mixed incontinence: Secondary | ICD-10-CM | POA: Diagnosis not present

## 2020-06-14 DIAGNOSIS — G309 Alzheimer's disease, unspecified: Secondary | ICD-10-CM | POA: Diagnosis not present

## 2020-06-15 DIAGNOSIS — G309 Alzheimer's disease, unspecified: Secondary | ICD-10-CM | POA: Diagnosis not present

## 2020-06-15 DIAGNOSIS — E039 Hypothyroidism, unspecified: Secondary | ICD-10-CM | POA: Diagnosis not present

## 2020-06-15 DIAGNOSIS — N3946 Mixed incontinence: Secondary | ICD-10-CM | POA: Diagnosis not present

## 2020-06-15 DIAGNOSIS — E785 Hyperlipidemia, unspecified: Secondary | ICD-10-CM | POA: Diagnosis not present

## 2020-06-15 DIAGNOSIS — J189 Pneumonia, unspecified organism: Secondary | ICD-10-CM | POA: Diagnosis not present

## 2020-06-15 DIAGNOSIS — M1990 Unspecified osteoarthritis, unspecified site: Secondary | ICD-10-CM | POA: Diagnosis not present

## 2020-06-17 DIAGNOSIS — N3946 Mixed incontinence: Secondary | ICD-10-CM | POA: Diagnosis not present

## 2020-06-17 DIAGNOSIS — G309 Alzheimer's disease, unspecified: Secondary | ICD-10-CM | POA: Diagnosis not present

## 2020-06-17 DIAGNOSIS — J189 Pneumonia, unspecified organism: Secondary | ICD-10-CM | POA: Diagnosis not present

## 2020-06-17 DIAGNOSIS — E039 Hypothyroidism, unspecified: Secondary | ICD-10-CM | POA: Diagnosis not present

## 2020-06-17 DIAGNOSIS — E785 Hyperlipidemia, unspecified: Secondary | ICD-10-CM | POA: Diagnosis not present

## 2020-06-17 DIAGNOSIS — M1990 Unspecified osteoarthritis, unspecified site: Secondary | ICD-10-CM | POA: Diagnosis not present

## 2020-06-20 DIAGNOSIS — E785 Hyperlipidemia, unspecified: Secondary | ICD-10-CM | POA: Diagnosis not present

## 2020-06-20 DIAGNOSIS — E039 Hypothyroidism, unspecified: Secondary | ICD-10-CM | POA: Diagnosis not present

## 2020-06-20 DIAGNOSIS — G309 Alzheimer's disease, unspecified: Secondary | ICD-10-CM | POA: Diagnosis not present

## 2020-06-20 DIAGNOSIS — M1990 Unspecified osteoarthritis, unspecified site: Secondary | ICD-10-CM | POA: Diagnosis not present

## 2020-06-20 DIAGNOSIS — J189 Pneumonia, unspecified organism: Secondary | ICD-10-CM | POA: Diagnosis not present

## 2020-06-20 DIAGNOSIS — N3946 Mixed incontinence: Secondary | ICD-10-CM | POA: Diagnosis not present

## 2020-06-22 DIAGNOSIS — E785 Hyperlipidemia, unspecified: Secondary | ICD-10-CM | POA: Diagnosis not present

## 2020-06-22 DIAGNOSIS — G309 Alzheimer's disease, unspecified: Secondary | ICD-10-CM | POA: Diagnosis not present

## 2020-06-22 DIAGNOSIS — N3946 Mixed incontinence: Secondary | ICD-10-CM | POA: Diagnosis not present

## 2020-06-22 DIAGNOSIS — E039 Hypothyroidism, unspecified: Secondary | ICD-10-CM | POA: Diagnosis not present

## 2020-06-22 DIAGNOSIS — M1990 Unspecified osteoarthritis, unspecified site: Secondary | ICD-10-CM | POA: Diagnosis not present

## 2020-06-22 DIAGNOSIS — J189 Pneumonia, unspecified organism: Secondary | ICD-10-CM | POA: Diagnosis not present

## 2020-06-24 DIAGNOSIS — G309 Alzheimer's disease, unspecified: Secondary | ICD-10-CM | POA: Diagnosis not present

## 2020-06-24 DIAGNOSIS — E785 Hyperlipidemia, unspecified: Secondary | ICD-10-CM | POA: Diagnosis not present

## 2020-06-24 DIAGNOSIS — M1990 Unspecified osteoarthritis, unspecified site: Secondary | ICD-10-CM | POA: Diagnosis not present

## 2020-06-24 DIAGNOSIS — N3946 Mixed incontinence: Secondary | ICD-10-CM | POA: Diagnosis not present

## 2020-06-24 DIAGNOSIS — E039 Hypothyroidism, unspecified: Secondary | ICD-10-CM | POA: Diagnosis not present

## 2020-06-24 DIAGNOSIS — J189 Pneumonia, unspecified organism: Secondary | ICD-10-CM | POA: Diagnosis not present

## 2020-06-27 DIAGNOSIS — J189 Pneumonia, unspecified organism: Secondary | ICD-10-CM | POA: Diagnosis not present

## 2020-06-27 DIAGNOSIS — M1990 Unspecified osteoarthritis, unspecified site: Secondary | ICD-10-CM | POA: Diagnosis not present

## 2020-06-27 DIAGNOSIS — E785 Hyperlipidemia, unspecified: Secondary | ICD-10-CM | POA: Diagnosis not present

## 2020-06-27 DIAGNOSIS — E039 Hypothyroidism, unspecified: Secondary | ICD-10-CM | POA: Diagnosis not present

## 2020-06-27 DIAGNOSIS — N3946 Mixed incontinence: Secondary | ICD-10-CM | POA: Diagnosis not present

## 2020-06-27 DIAGNOSIS — G309 Alzheimer's disease, unspecified: Secondary | ICD-10-CM | POA: Diagnosis not present

## 2020-06-29 DIAGNOSIS — R0602 Shortness of breath: Secondary | ICD-10-CM | POA: Diagnosis not present

## 2020-06-29 DIAGNOSIS — M1990 Unspecified osteoarthritis, unspecified site: Secondary | ICD-10-CM | POA: Diagnosis not present

## 2020-06-29 DIAGNOSIS — G309 Alzheimer's disease, unspecified: Secondary | ICD-10-CM | POA: Diagnosis not present

## 2020-06-29 DIAGNOSIS — E039 Hypothyroidism, unspecified: Secondary | ICD-10-CM | POA: Diagnosis not present

## 2020-06-29 DIAGNOSIS — N3946 Mixed incontinence: Secondary | ICD-10-CM | POA: Diagnosis not present

## 2020-06-29 DIAGNOSIS — E785 Hyperlipidemia, unspecified: Secondary | ICD-10-CM | POA: Diagnosis not present

## 2020-06-29 DIAGNOSIS — R531 Weakness: Secondary | ICD-10-CM | POA: Diagnosis not present

## 2020-06-29 DIAGNOSIS — J189 Pneumonia, unspecified organism: Secondary | ICD-10-CM | POA: Diagnosis not present

## 2020-06-30 DIAGNOSIS — M1990 Unspecified osteoarthritis, unspecified site: Secondary | ICD-10-CM | POA: Diagnosis not present

## 2020-06-30 DIAGNOSIS — G309 Alzheimer's disease, unspecified: Secondary | ICD-10-CM | POA: Diagnosis not present

## 2020-06-30 DIAGNOSIS — E785 Hyperlipidemia, unspecified: Secondary | ICD-10-CM | POA: Diagnosis not present

## 2020-06-30 DIAGNOSIS — J189 Pneumonia, unspecified organism: Secondary | ICD-10-CM | POA: Diagnosis not present

## 2020-06-30 DIAGNOSIS — E039 Hypothyroidism, unspecified: Secondary | ICD-10-CM | POA: Diagnosis not present

## 2020-06-30 DIAGNOSIS — N3946 Mixed incontinence: Secondary | ICD-10-CM | POA: Diagnosis not present

## 2020-07-01 DIAGNOSIS — G309 Alzheimer's disease, unspecified: Secondary | ICD-10-CM | POA: Diagnosis not present

## 2020-07-01 DIAGNOSIS — E039 Hypothyroidism, unspecified: Secondary | ICD-10-CM | POA: Diagnosis not present

## 2020-07-01 DIAGNOSIS — M1990 Unspecified osteoarthritis, unspecified site: Secondary | ICD-10-CM | POA: Diagnosis not present

## 2020-07-01 DIAGNOSIS — J189 Pneumonia, unspecified organism: Secondary | ICD-10-CM | POA: Diagnosis not present

## 2020-07-01 DIAGNOSIS — N3946 Mixed incontinence: Secondary | ICD-10-CM | POA: Diagnosis not present

## 2020-07-01 DIAGNOSIS — E785 Hyperlipidemia, unspecified: Secondary | ICD-10-CM | POA: Diagnosis not present

## 2020-07-06 DIAGNOSIS — E785 Hyperlipidemia, unspecified: Secondary | ICD-10-CM | POA: Diagnosis not present

## 2020-07-06 DIAGNOSIS — E039 Hypothyroidism, unspecified: Secondary | ICD-10-CM | POA: Diagnosis not present

## 2020-07-06 DIAGNOSIS — M1990 Unspecified osteoarthritis, unspecified site: Secondary | ICD-10-CM | POA: Diagnosis not present

## 2020-07-06 DIAGNOSIS — G309 Alzheimer's disease, unspecified: Secondary | ICD-10-CM | POA: Diagnosis not present

## 2020-07-06 DIAGNOSIS — N3946 Mixed incontinence: Secondary | ICD-10-CM | POA: Diagnosis not present

## 2020-07-06 DIAGNOSIS — J189 Pneumonia, unspecified organism: Secondary | ICD-10-CM | POA: Diagnosis not present

## 2020-07-08 DIAGNOSIS — E039 Hypothyroidism, unspecified: Secondary | ICD-10-CM | POA: Diagnosis not present

## 2020-07-08 DIAGNOSIS — E785 Hyperlipidemia, unspecified: Secondary | ICD-10-CM | POA: Diagnosis not present

## 2020-07-08 DIAGNOSIS — N3946 Mixed incontinence: Secondary | ICD-10-CM | POA: Diagnosis not present

## 2020-07-08 DIAGNOSIS — J189 Pneumonia, unspecified organism: Secondary | ICD-10-CM | POA: Diagnosis not present

## 2020-07-08 DIAGNOSIS — G309 Alzheimer's disease, unspecified: Secondary | ICD-10-CM | POA: Diagnosis not present

## 2020-07-08 DIAGNOSIS — M1990 Unspecified osteoarthritis, unspecified site: Secondary | ICD-10-CM | POA: Diagnosis not present

## 2020-07-11 DIAGNOSIS — E039 Hypothyroidism, unspecified: Secondary | ICD-10-CM | POA: Diagnosis not present

## 2020-07-11 DIAGNOSIS — G309 Alzheimer's disease, unspecified: Secondary | ICD-10-CM | POA: Diagnosis not present

## 2020-07-11 DIAGNOSIS — J189 Pneumonia, unspecified organism: Secondary | ICD-10-CM | POA: Diagnosis not present

## 2020-07-11 DIAGNOSIS — M1990 Unspecified osteoarthritis, unspecified site: Secondary | ICD-10-CM | POA: Diagnosis not present

## 2020-07-11 DIAGNOSIS — N3946 Mixed incontinence: Secondary | ICD-10-CM | POA: Diagnosis not present

## 2020-07-11 DIAGNOSIS — E785 Hyperlipidemia, unspecified: Secondary | ICD-10-CM | POA: Diagnosis not present

## 2020-07-12 DIAGNOSIS — J189 Pneumonia, unspecified organism: Secondary | ICD-10-CM | POA: Diagnosis not present

## 2020-07-12 DIAGNOSIS — G309 Alzheimer's disease, unspecified: Secondary | ICD-10-CM | POA: Diagnosis not present

## 2020-07-12 DIAGNOSIS — E785 Hyperlipidemia, unspecified: Secondary | ICD-10-CM | POA: Diagnosis not present

## 2020-07-12 DIAGNOSIS — N3946 Mixed incontinence: Secondary | ICD-10-CM | POA: Diagnosis not present

## 2020-07-12 DIAGNOSIS — E039 Hypothyroidism, unspecified: Secondary | ICD-10-CM | POA: Diagnosis not present

## 2020-07-12 DIAGNOSIS — M1990 Unspecified osteoarthritis, unspecified site: Secondary | ICD-10-CM | POA: Diagnosis not present

## 2020-07-13 DIAGNOSIS — G309 Alzheimer's disease, unspecified: Secondary | ICD-10-CM | POA: Diagnosis not present

## 2020-07-13 DIAGNOSIS — M1990 Unspecified osteoarthritis, unspecified site: Secondary | ICD-10-CM | POA: Diagnosis not present

## 2020-07-13 DIAGNOSIS — J189 Pneumonia, unspecified organism: Secondary | ICD-10-CM | POA: Diagnosis not present

## 2020-07-13 DIAGNOSIS — N3946 Mixed incontinence: Secondary | ICD-10-CM | POA: Diagnosis not present

## 2020-07-13 DIAGNOSIS — E039 Hypothyroidism, unspecified: Secondary | ICD-10-CM | POA: Diagnosis not present

## 2020-07-13 DIAGNOSIS — E785 Hyperlipidemia, unspecified: Secondary | ICD-10-CM | POA: Diagnosis not present

## 2020-07-14 DIAGNOSIS — G309 Alzheimer's disease, unspecified: Secondary | ICD-10-CM | POA: Diagnosis not present

## 2020-07-14 DIAGNOSIS — M1990 Unspecified osteoarthritis, unspecified site: Secondary | ICD-10-CM | POA: Diagnosis not present

## 2020-07-14 DIAGNOSIS — N3946 Mixed incontinence: Secondary | ICD-10-CM | POA: Diagnosis not present

## 2020-07-14 DIAGNOSIS — E039 Hypothyroidism, unspecified: Secondary | ICD-10-CM | POA: Diagnosis not present

## 2020-07-14 DIAGNOSIS — J189 Pneumonia, unspecified organism: Secondary | ICD-10-CM | POA: Diagnosis not present

## 2020-07-14 DIAGNOSIS — E785 Hyperlipidemia, unspecified: Secondary | ICD-10-CM | POA: Diagnosis not present

## 2020-07-15 DIAGNOSIS — G309 Alzheimer's disease, unspecified: Secondary | ICD-10-CM | POA: Diagnosis not present

## 2020-07-15 DIAGNOSIS — E039 Hypothyroidism, unspecified: Secondary | ICD-10-CM | POA: Diagnosis not present

## 2020-07-15 DIAGNOSIS — E785 Hyperlipidemia, unspecified: Secondary | ICD-10-CM | POA: Diagnosis not present

## 2020-07-15 DIAGNOSIS — M1990 Unspecified osteoarthritis, unspecified site: Secondary | ICD-10-CM | POA: Diagnosis not present

## 2020-07-15 DIAGNOSIS — N3946 Mixed incontinence: Secondary | ICD-10-CM | POA: Diagnosis not present

## 2020-07-15 DIAGNOSIS — J189 Pneumonia, unspecified organism: Secondary | ICD-10-CM | POA: Diagnosis not present

## 2020-07-19 DIAGNOSIS — N3946 Mixed incontinence: Secondary | ICD-10-CM | POA: Diagnosis not present

## 2020-07-19 DIAGNOSIS — E785 Hyperlipidemia, unspecified: Secondary | ICD-10-CM | POA: Diagnosis not present

## 2020-07-19 DIAGNOSIS — M1990 Unspecified osteoarthritis, unspecified site: Secondary | ICD-10-CM | POA: Diagnosis not present

## 2020-07-19 DIAGNOSIS — J189 Pneumonia, unspecified organism: Secondary | ICD-10-CM | POA: Diagnosis not present

## 2020-07-19 DIAGNOSIS — G309 Alzheimer's disease, unspecified: Secondary | ICD-10-CM | POA: Diagnosis not present

## 2020-07-19 DIAGNOSIS — E039 Hypothyroidism, unspecified: Secondary | ICD-10-CM | POA: Diagnosis not present

## 2020-07-20 DIAGNOSIS — E785 Hyperlipidemia, unspecified: Secondary | ICD-10-CM | POA: Diagnosis not present

## 2020-07-20 DIAGNOSIS — E039 Hypothyroidism, unspecified: Secondary | ICD-10-CM | POA: Diagnosis not present

## 2020-07-20 DIAGNOSIS — M1990 Unspecified osteoarthritis, unspecified site: Secondary | ICD-10-CM | POA: Diagnosis not present

## 2020-07-20 DIAGNOSIS — J189 Pneumonia, unspecified organism: Secondary | ICD-10-CM | POA: Diagnosis not present

## 2020-07-20 DIAGNOSIS — N3946 Mixed incontinence: Secondary | ICD-10-CM | POA: Diagnosis not present

## 2020-07-20 DIAGNOSIS — G309 Alzheimer's disease, unspecified: Secondary | ICD-10-CM | POA: Diagnosis not present

## 2020-07-21 DIAGNOSIS — E039 Hypothyroidism, unspecified: Secondary | ICD-10-CM | POA: Diagnosis not present

## 2020-07-21 DIAGNOSIS — G309 Alzheimer's disease, unspecified: Secondary | ICD-10-CM | POA: Diagnosis not present

## 2020-07-21 DIAGNOSIS — J189 Pneumonia, unspecified organism: Secondary | ICD-10-CM | POA: Diagnosis not present

## 2020-07-21 DIAGNOSIS — N3946 Mixed incontinence: Secondary | ICD-10-CM | POA: Diagnosis not present

## 2020-07-21 DIAGNOSIS — M1990 Unspecified osteoarthritis, unspecified site: Secondary | ICD-10-CM | POA: Diagnosis not present

## 2020-07-21 DIAGNOSIS — E785 Hyperlipidemia, unspecified: Secondary | ICD-10-CM | POA: Diagnosis not present

## 2020-07-22 DIAGNOSIS — J189 Pneumonia, unspecified organism: Secondary | ICD-10-CM | POA: Diagnosis not present

## 2020-07-22 DIAGNOSIS — M1990 Unspecified osteoarthritis, unspecified site: Secondary | ICD-10-CM | POA: Diagnosis not present

## 2020-07-22 DIAGNOSIS — G309 Alzheimer's disease, unspecified: Secondary | ICD-10-CM | POA: Diagnosis not present

## 2020-07-22 DIAGNOSIS — N3946 Mixed incontinence: Secondary | ICD-10-CM | POA: Diagnosis not present

## 2020-07-22 DIAGNOSIS — E785 Hyperlipidemia, unspecified: Secondary | ICD-10-CM | POA: Diagnosis not present

## 2020-07-22 DIAGNOSIS — E039 Hypothyroidism, unspecified: Secondary | ICD-10-CM | POA: Diagnosis not present

## 2020-07-26 DIAGNOSIS — J189 Pneumonia, unspecified organism: Secondary | ICD-10-CM | POA: Diagnosis not present

## 2020-07-26 DIAGNOSIS — E039 Hypothyroidism, unspecified: Secondary | ICD-10-CM | POA: Diagnosis not present

## 2020-07-26 DIAGNOSIS — G309 Alzheimer's disease, unspecified: Secondary | ICD-10-CM | POA: Diagnosis not present

## 2020-07-26 DIAGNOSIS — E785 Hyperlipidemia, unspecified: Secondary | ICD-10-CM | POA: Diagnosis not present

## 2020-07-26 DIAGNOSIS — M1990 Unspecified osteoarthritis, unspecified site: Secondary | ICD-10-CM | POA: Diagnosis not present

## 2020-07-26 DIAGNOSIS — N3946 Mixed incontinence: Secondary | ICD-10-CM | POA: Diagnosis not present

## 2020-07-27 DIAGNOSIS — E785 Hyperlipidemia, unspecified: Secondary | ICD-10-CM | POA: Diagnosis not present

## 2020-07-27 DIAGNOSIS — E039 Hypothyroidism, unspecified: Secondary | ICD-10-CM | POA: Diagnosis not present

## 2020-07-27 DIAGNOSIS — N3946 Mixed incontinence: Secondary | ICD-10-CM | POA: Diagnosis not present

## 2020-07-27 DIAGNOSIS — M1990 Unspecified osteoarthritis, unspecified site: Secondary | ICD-10-CM | POA: Diagnosis not present

## 2020-07-27 DIAGNOSIS — G309 Alzheimer's disease, unspecified: Secondary | ICD-10-CM | POA: Diagnosis not present

## 2020-07-27 DIAGNOSIS — J189 Pneumonia, unspecified organism: Secondary | ICD-10-CM | POA: Diagnosis not present

## 2020-07-28 DIAGNOSIS — M1990 Unspecified osteoarthritis, unspecified site: Secondary | ICD-10-CM | POA: Diagnosis not present

## 2020-07-28 DIAGNOSIS — E785 Hyperlipidemia, unspecified: Secondary | ICD-10-CM | POA: Diagnosis not present

## 2020-07-28 DIAGNOSIS — N3946 Mixed incontinence: Secondary | ICD-10-CM | POA: Diagnosis not present

## 2020-07-28 DIAGNOSIS — E039 Hypothyroidism, unspecified: Secondary | ICD-10-CM | POA: Diagnosis not present

## 2020-07-28 DIAGNOSIS — G309 Alzheimer's disease, unspecified: Secondary | ICD-10-CM | POA: Diagnosis not present

## 2020-07-28 DIAGNOSIS — J189 Pneumonia, unspecified organism: Secondary | ICD-10-CM | POA: Diagnosis not present

## 2020-07-29 DIAGNOSIS — J189 Pneumonia, unspecified organism: Secondary | ICD-10-CM | POA: Diagnosis not present

## 2020-07-29 DIAGNOSIS — G309 Alzheimer's disease, unspecified: Secondary | ICD-10-CM | POA: Diagnosis not present

## 2020-07-29 DIAGNOSIS — M1990 Unspecified osteoarthritis, unspecified site: Secondary | ICD-10-CM | POA: Diagnosis not present

## 2020-07-29 DIAGNOSIS — R0602 Shortness of breath: Secondary | ICD-10-CM | POA: Diagnosis not present

## 2020-07-29 DIAGNOSIS — N3946 Mixed incontinence: Secondary | ICD-10-CM | POA: Diagnosis not present

## 2020-07-29 DIAGNOSIS — E785 Hyperlipidemia, unspecified: Secondary | ICD-10-CM | POA: Diagnosis not present

## 2020-07-29 DIAGNOSIS — E039 Hypothyroidism, unspecified: Secondary | ICD-10-CM | POA: Diagnosis not present

## 2020-07-29 DIAGNOSIS — R531 Weakness: Secondary | ICD-10-CM | POA: Diagnosis not present

## 2020-08-02 DIAGNOSIS — E039 Hypothyroidism, unspecified: Secondary | ICD-10-CM | POA: Diagnosis not present

## 2020-08-02 DIAGNOSIS — G309 Alzheimer's disease, unspecified: Secondary | ICD-10-CM | POA: Diagnosis not present

## 2020-08-02 DIAGNOSIS — M1990 Unspecified osteoarthritis, unspecified site: Secondary | ICD-10-CM | POA: Diagnosis not present

## 2020-08-02 DIAGNOSIS — J189 Pneumonia, unspecified organism: Secondary | ICD-10-CM | POA: Diagnosis not present

## 2020-08-02 DIAGNOSIS — E785 Hyperlipidemia, unspecified: Secondary | ICD-10-CM | POA: Diagnosis not present

## 2020-08-02 DIAGNOSIS — N3946 Mixed incontinence: Secondary | ICD-10-CM | POA: Diagnosis not present

## 2020-08-04 DIAGNOSIS — J189 Pneumonia, unspecified organism: Secondary | ICD-10-CM | POA: Diagnosis not present

## 2020-08-04 DIAGNOSIS — M1990 Unspecified osteoarthritis, unspecified site: Secondary | ICD-10-CM | POA: Diagnosis not present

## 2020-08-04 DIAGNOSIS — N3946 Mixed incontinence: Secondary | ICD-10-CM | POA: Diagnosis not present

## 2020-08-04 DIAGNOSIS — E785 Hyperlipidemia, unspecified: Secondary | ICD-10-CM | POA: Diagnosis not present

## 2020-08-04 DIAGNOSIS — E039 Hypothyroidism, unspecified: Secondary | ICD-10-CM | POA: Diagnosis not present

## 2020-08-04 DIAGNOSIS — G309 Alzheimer's disease, unspecified: Secondary | ICD-10-CM | POA: Diagnosis not present

## 2020-08-09 DIAGNOSIS — N3946 Mixed incontinence: Secondary | ICD-10-CM | POA: Diagnosis not present

## 2020-08-09 DIAGNOSIS — E039 Hypothyroidism, unspecified: Secondary | ICD-10-CM | POA: Diagnosis not present

## 2020-08-09 DIAGNOSIS — J189 Pneumonia, unspecified organism: Secondary | ICD-10-CM | POA: Diagnosis not present

## 2020-08-09 DIAGNOSIS — G309 Alzheimer's disease, unspecified: Secondary | ICD-10-CM | POA: Diagnosis not present

## 2020-08-09 DIAGNOSIS — M1990 Unspecified osteoarthritis, unspecified site: Secondary | ICD-10-CM | POA: Diagnosis not present

## 2020-08-09 DIAGNOSIS — E785 Hyperlipidemia, unspecified: Secondary | ICD-10-CM | POA: Diagnosis not present

## 2020-08-11 DIAGNOSIS — M1990 Unspecified osteoarthritis, unspecified site: Secondary | ICD-10-CM | POA: Diagnosis not present

## 2020-08-11 DIAGNOSIS — N3946 Mixed incontinence: Secondary | ICD-10-CM | POA: Diagnosis not present

## 2020-08-11 DIAGNOSIS — J189 Pneumonia, unspecified organism: Secondary | ICD-10-CM | POA: Diagnosis not present

## 2020-08-11 DIAGNOSIS — E785 Hyperlipidemia, unspecified: Secondary | ICD-10-CM | POA: Diagnosis not present

## 2020-08-11 DIAGNOSIS — G309 Alzheimer's disease, unspecified: Secondary | ICD-10-CM | POA: Diagnosis not present

## 2020-08-11 DIAGNOSIS — E039 Hypothyroidism, unspecified: Secondary | ICD-10-CM | POA: Diagnosis not present

## 2020-08-15 DIAGNOSIS — M1990 Unspecified osteoarthritis, unspecified site: Secondary | ICD-10-CM | POA: Diagnosis not present

## 2020-08-15 DIAGNOSIS — G309 Alzheimer's disease, unspecified: Secondary | ICD-10-CM | POA: Diagnosis not present

## 2020-08-15 DIAGNOSIS — E785 Hyperlipidemia, unspecified: Secondary | ICD-10-CM | POA: Diagnosis not present

## 2020-08-15 DIAGNOSIS — E039 Hypothyroidism, unspecified: Secondary | ICD-10-CM | POA: Diagnosis not present

## 2020-08-15 DIAGNOSIS — J189 Pneumonia, unspecified organism: Secondary | ICD-10-CM | POA: Diagnosis not present

## 2020-08-15 DIAGNOSIS — N3946 Mixed incontinence: Secondary | ICD-10-CM | POA: Diagnosis not present

## 2020-08-16 DIAGNOSIS — E785 Hyperlipidemia, unspecified: Secondary | ICD-10-CM | POA: Diagnosis not present

## 2020-08-16 DIAGNOSIS — N3946 Mixed incontinence: Secondary | ICD-10-CM | POA: Diagnosis not present

## 2020-08-16 DIAGNOSIS — J189 Pneumonia, unspecified organism: Secondary | ICD-10-CM | POA: Diagnosis not present

## 2020-08-16 DIAGNOSIS — E039 Hypothyroidism, unspecified: Secondary | ICD-10-CM | POA: Diagnosis not present

## 2020-08-16 DIAGNOSIS — G309 Alzheimer's disease, unspecified: Secondary | ICD-10-CM | POA: Diagnosis not present

## 2020-08-16 DIAGNOSIS — M1990 Unspecified osteoarthritis, unspecified site: Secondary | ICD-10-CM | POA: Diagnosis not present

## 2020-08-17 DIAGNOSIS — E785 Hyperlipidemia, unspecified: Secondary | ICD-10-CM | POA: Diagnosis not present

## 2020-08-17 DIAGNOSIS — N3946 Mixed incontinence: Secondary | ICD-10-CM | POA: Diagnosis not present

## 2020-08-17 DIAGNOSIS — E039 Hypothyroidism, unspecified: Secondary | ICD-10-CM | POA: Diagnosis not present

## 2020-08-17 DIAGNOSIS — G309 Alzheimer's disease, unspecified: Secondary | ICD-10-CM | POA: Diagnosis not present

## 2020-08-17 DIAGNOSIS — M1990 Unspecified osteoarthritis, unspecified site: Secondary | ICD-10-CM | POA: Diagnosis not present

## 2020-08-17 DIAGNOSIS — J189 Pneumonia, unspecified organism: Secondary | ICD-10-CM | POA: Diagnosis not present

## 2020-08-18 DIAGNOSIS — G309 Alzheimer's disease, unspecified: Secondary | ICD-10-CM | POA: Diagnosis not present

## 2020-08-18 DIAGNOSIS — M1990 Unspecified osteoarthritis, unspecified site: Secondary | ICD-10-CM | POA: Diagnosis not present

## 2020-08-18 DIAGNOSIS — E785 Hyperlipidemia, unspecified: Secondary | ICD-10-CM | POA: Diagnosis not present

## 2020-08-18 DIAGNOSIS — E039 Hypothyroidism, unspecified: Secondary | ICD-10-CM | POA: Diagnosis not present

## 2020-08-18 DIAGNOSIS — J189 Pneumonia, unspecified organism: Secondary | ICD-10-CM | POA: Diagnosis not present

## 2020-08-18 DIAGNOSIS — N3946 Mixed incontinence: Secondary | ICD-10-CM | POA: Diagnosis not present

## 2020-08-19 DIAGNOSIS — G309 Alzheimer's disease, unspecified: Secondary | ICD-10-CM | POA: Diagnosis not present

## 2020-08-19 DIAGNOSIS — N3946 Mixed incontinence: Secondary | ICD-10-CM | POA: Diagnosis not present

## 2020-08-19 DIAGNOSIS — E039 Hypothyroidism, unspecified: Secondary | ICD-10-CM | POA: Diagnosis not present

## 2020-08-19 DIAGNOSIS — J189 Pneumonia, unspecified organism: Secondary | ICD-10-CM | POA: Diagnosis not present

## 2020-08-19 DIAGNOSIS — M1990 Unspecified osteoarthritis, unspecified site: Secondary | ICD-10-CM | POA: Diagnosis not present

## 2020-08-19 DIAGNOSIS — E785 Hyperlipidemia, unspecified: Secondary | ICD-10-CM | POA: Diagnosis not present

## 2020-08-22 DIAGNOSIS — E785 Hyperlipidemia, unspecified: Secondary | ICD-10-CM | POA: Diagnosis not present

## 2020-08-22 DIAGNOSIS — J189 Pneumonia, unspecified organism: Secondary | ICD-10-CM | POA: Diagnosis not present

## 2020-08-22 DIAGNOSIS — G309 Alzheimer's disease, unspecified: Secondary | ICD-10-CM | POA: Diagnosis not present

## 2020-08-22 DIAGNOSIS — N3946 Mixed incontinence: Secondary | ICD-10-CM | POA: Diagnosis not present

## 2020-08-22 DIAGNOSIS — E039 Hypothyroidism, unspecified: Secondary | ICD-10-CM | POA: Diagnosis not present

## 2020-08-22 DIAGNOSIS — M1990 Unspecified osteoarthritis, unspecified site: Secondary | ICD-10-CM | POA: Diagnosis not present

## 2020-08-23 DIAGNOSIS — E039 Hypothyroidism, unspecified: Secondary | ICD-10-CM | POA: Diagnosis not present

## 2020-08-23 DIAGNOSIS — J189 Pneumonia, unspecified organism: Secondary | ICD-10-CM | POA: Diagnosis not present

## 2020-08-23 DIAGNOSIS — G309 Alzheimer's disease, unspecified: Secondary | ICD-10-CM | POA: Diagnosis not present

## 2020-08-23 DIAGNOSIS — E785 Hyperlipidemia, unspecified: Secondary | ICD-10-CM | POA: Diagnosis not present

## 2020-08-23 DIAGNOSIS — N3946 Mixed incontinence: Secondary | ICD-10-CM | POA: Diagnosis not present

## 2020-08-23 DIAGNOSIS — M1990 Unspecified osteoarthritis, unspecified site: Secondary | ICD-10-CM | POA: Diagnosis not present

## 2020-08-24 DIAGNOSIS — G309 Alzheimer's disease, unspecified: Secondary | ICD-10-CM | POA: Diagnosis not present

## 2020-08-24 DIAGNOSIS — J189 Pneumonia, unspecified organism: Secondary | ICD-10-CM | POA: Diagnosis not present

## 2020-08-24 DIAGNOSIS — M1990 Unspecified osteoarthritis, unspecified site: Secondary | ICD-10-CM | POA: Diagnosis not present

## 2020-08-24 DIAGNOSIS — E039 Hypothyroidism, unspecified: Secondary | ICD-10-CM | POA: Diagnosis not present

## 2020-08-24 DIAGNOSIS — E785 Hyperlipidemia, unspecified: Secondary | ICD-10-CM | POA: Diagnosis not present

## 2020-08-24 DIAGNOSIS — N3946 Mixed incontinence: Secondary | ICD-10-CM | POA: Diagnosis not present

## 2020-08-25 DIAGNOSIS — G309 Alzheimer's disease, unspecified: Secondary | ICD-10-CM | POA: Diagnosis not present

## 2020-08-25 DIAGNOSIS — J189 Pneumonia, unspecified organism: Secondary | ICD-10-CM | POA: Diagnosis not present

## 2020-08-25 DIAGNOSIS — E039 Hypothyroidism, unspecified: Secondary | ICD-10-CM | POA: Diagnosis not present

## 2020-08-25 DIAGNOSIS — E785 Hyperlipidemia, unspecified: Secondary | ICD-10-CM | POA: Diagnosis not present

## 2020-08-25 DIAGNOSIS — N3946 Mixed incontinence: Secondary | ICD-10-CM | POA: Diagnosis not present

## 2020-08-25 DIAGNOSIS — M1990 Unspecified osteoarthritis, unspecified site: Secondary | ICD-10-CM | POA: Diagnosis not present

## 2020-08-26 DIAGNOSIS — E039 Hypothyroidism, unspecified: Secondary | ICD-10-CM | POA: Diagnosis not present

## 2020-08-26 DIAGNOSIS — J189 Pneumonia, unspecified organism: Secondary | ICD-10-CM | POA: Diagnosis not present

## 2020-08-26 DIAGNOSIS — N3946 Mixed incontinence: Secondary | ICD-10-CM | POA: Diagnosis not present

## 2020-08-26 DIAGNOSIS — E785 Hyperlipidemia, unspecified: Secondary | ICD-10-CM | POA: Diagnosis not present

## 2020-08-26 DIAGNOSIS — G309 Alzheimer's disease, unspecified: Secondary | ICD-10-CM | POA: Diagnosis not present

## 2020-08-26 DIAGNOSIS — M1990 Unspecified osteoarthritis, unspecified site: Secondary | ICD-10-CM | POA: Diagnosis not present

## 2020-08-29 DIAGNOSIS — J189 Pneumonia, unspecified organism: Secondary | ICD-10-CM | POA: Diagnosis not present

## 2020-08-29 DIAGNOSIS — G309 Alzheimer's disease, unspecified: Secondary | ICD-10-CM | POA: Diagnosis not present

## 2020-08-29 DIAGNOSIS — R531 Weakness: Secondary | ICD-10-CM | POA: Diagnosis not present

## 2020-08-29 DIAGNOSIS — R0602 Shortness of breath: Secondary | ICD-10-CM | POA: Diagnosis not present

## 2020-08-29 DIAGNOSIS — N3946 Mixed incontinence: Secondary | ICD-10-CM | POA: Diagnosis not present

## 2020-08-29 DIAGNOSIS — E785 Hyperlipidemia, unspecified: Secondary | ICD-10-CM | POA: Diagnosis not present

## 2020-08-29 DIAGNOSIS — M1990 Unspecified osteoarthritis, unspecified site: Secondary | ICD-10-CM | POA: Diagnosis not present

## 2020-08-29 DIAGNOSIS — E039 Hypothyroidism, unspecified: Secondary | ICD-10-CM | POA: Diagnosis not present

## 2020-08-30 DIAGNOSIS — G309 Alzheimer's disease, unspecified: Secondary | ICD-10-CM | POA: Diagnosis not present

## 2020-08-30 DIAGNOSIS — M1990 Unspecified osteoarthritis, unspecified site: Secondary | ICD-10-CM | POA: Diagnosis not present

## 2020-08-30 DIAGNOSIS — E785 Hyperlipidemia, unspecified: Secondary | ICD-10-CM | POA: Diagnosis not present

## 2020-08-30 DIAGNOSIS — N3946 Mixed incontinence: Secondary | ICD-10-CM | POA: Diagnosis not present

## 2020-08-30 DIAGNOSIS — E039 Hypothyroidism, unspecified: Secondary | ICD-10-CM | POA: Diagnosis not present

## 2020-08-30 DIAGNOSIS — J189 Pneumonia, unspecified organism: Secondary | ICD-10-CM | POA: Diagnosis not present

## 2020-08-31 DIAGNOSIS — G309 Alzheimer's disease, unspecified: Secondary | ICD-10-CM | POA: Diagnosis not present

## 2020-08-31 DIAGNOSIS — E039 Hypothyroidism, unspecified: Secondary | ICD-10-CM | POA: Diagnosis not present

## 2020-08-31 DIAGNOSIS — E785 Hyperlipidemia, unspecified: Secondary | ICD-10-CM | POA: Diagnosis not present

## 2020-08-31 DIAGNOSIS — N3946 Mixed incontinence: Secondary | ICD-10-CM | POA: Diagnosis not present

## 2020-08-31 DIAGNOSIS — J189 Pneumonia, unspecified organism: Secondary | ICD-10-CM | POA: Diagnosis not present

## 2020-08-31 DIAGNOSIS — M1990 Unspecified osteoarthritis, unspecified site: Secondary | ICD-10-CM | POA: Diagnosis not present

## 2020-09-01 DIAGNOSIS — M1990 Unspecified osteoarthritis, unspecified site: Secondary | ICD-10-CM | POA: Diagnosis not present

## 2020-09-01 DIAGNOSIS — N3946 Mixed incontinence: Secondary | ICD-10-CM | POA: Diagnosis not present

## 2020-09-01 DIAGNOSIS — J189 Pneumonia, unspecified organism: Secondary | ICD-10-CM | POA: Diagnosis not present

## 2020-09-01 DIAGNOSIS — G309 Alzheimer's disease, unspecified: Secondary | ICD-10-CM | POA: Diagnosis not present

## 2020-09-01 DIAGNOSIS — E039 Hypothyroidism, unspecified: Secondary | ICD-10-CM | POA: Diagnosis not present

## 2020-09-01 DIAGNOSIS — E785 Hyperlipidemia, unspecified: Secondary | ICD-10-CM | POA: Diagnosis not present

## 2020-09-02 DIAGNOSIS — E039 Hypothyroidism, unspecified: Secondary | ICD-10-CM | POA: Diagnosis not present

## 2020-09-02 DIAGNOSIS — N3946 Mixed incontinence: Secondary | ICD-10-CM | POA: Diagnosis not present

## 2020-09-02 DIAGNOSIS — G309 Alzheimer's disease, unspecified: Secondary | ICD-10-CM | POA: Diagnosis not present

## 2020-09-02 DIAGNOSIS — J189 Pneumonia, unspecified organism: Secondary | ICD-10-CM | POA: Diagnosis not present

## 2020-09-02 DIAGNOSIS — E785 Hyperlipidemia, unspecified: Secondary | ICD-10-CM | POA: Diagnosis not present

## 2020-09-02 DIAGNOSIS — M1990 Unspecified osteoarthritis, unspecified site: Secondary | ICD-10-CM | POA: Diagnosis not present

## 2020-09-05 DIAGNOSIS — E039 Hypothyroidism, unspecified: Secondary | ICD-10-CM | POA: Diagnosis not present

## 2020-09-05 DIAGNOSIS — M1990 Unspecified osteoarthritis, unspecified site: Secondary | ICD-10-CM | POA: Diagnosis not present

## 2020-09-05 DIAGNOSIS — N3946 Mixed incontinence: Secondary | ICD-10-CM | POA: Diagnosis not present

## 2020-09-05 DIAGNOSIS — G309 Alzheimer's disease, unspecified: Secondary | ICD-10-CM | POA: Diagnosis not present

## 2020-09-05 DIAGNOSIS — J189 Pneumonia, unspecified organism: Secondary | ICD-10-CM | POA: Diagnosis not present

## 2020-09-05 DIAGNOSIS — E785 Hyperlipidemia, unspecified: Secondary | ICD-10-CM | POA: Diagnosis not present

## 2020-09-06 DIAGNOSIS — E039 Hypothyroidism, unspecified: Secondary | ICD-10-CM | POA: Diagnosis not present

## 2020-09-06 DIAGNOSIS — E785 Hyperlipidemia, unspecified: Secondary | ICD-10-CM | POA: Diagnosis not present

## 2020-09-06 DIAGNOSIS — M1990 Unspecified osteoarthritis, unspecified site: Secondary | ICD-10-CM | POA: Diagnosis not present

## 2020-09-06 DIAGNOSIS — N3946 Mixed incontinence: Secondary | ICD-10-CM | POA: Diagnosis not present

## 2020-09-06 DIAGNOSIS — J189 Pneumonia, unspecified organism: Secondary | ICD-10-CM | POA: Diagnosis not present

## 2020-09-06 DIAGNOSIS — G309 Alzheimer's disease, unspecified: Secondary | ICD-10-CM | POA: Diagnosis not present

## 2020-09-07 DIAGNOSIS — G309 Alzheimer's disease, unspecified: Secondary | ICD-10-CM | POA: Diagnosis not present

## 2020-09-07 DIAGNOSIS — E785 Hyperlipidemia, unspecified: Secondary | ICD-10-CM | POA: Diagnosis not present

## 2020-09-07 DIAGNOSIS — J189 Pneumonia, unspecified organism: Secondary | ICD-10-CM | POA: Diagnosis not present

## 2020-09-07 DIAGNOSIS — E039 Hypothyroidism, unspecified: Secondary | ICD-10-CM | POA: Diagnosis not present

## 2020-09-07 DIAGNOSIS — M1990 Unspecified osteoarthritis, unspecified site: Secondary | ICD-10-CM | POA: Diagnosis not present

## 2020-09-07 DIAGNOSIS — N3946 Mixed incontinence: Secondary | ICD-10-CM | POA: Diagnosis not present

## 2020-09-08 DIAGNOSIS — E039 Hypothyroidism, unspecified: Secondary | ICD-10-CM | POA: Diagnosis not present

## 2020-09-08 DIAGNOSIS — E785 Hyperlipidemia, unspecified: Secondary | ICD-10-CM | POA: Diagnosis not present

## 2020-09-08 DIAGNOSIS — J189 Pneumonia, unspecified organism: Secondary | ICD-10-CM | POA: Diagnosis not present

## 2020-09-08 DIAGNOSIS — M1990 Unspecified osteoarthritis, unspecified site: Secondary | ICD-10-CM | POA: Diagnosis not present

## 2020-09-08 DIAGNOSIS — N3946 Mixed incontinence: Secondary | ICD-10-CM | POA: Diagnosis not present

## 2020-09-08 DIAGNOSIS — G309 Alzheimer's disease, unspecified: Secondary | ICD-10-CM | POA: Diagnosis not present

## 2020-09-13 DIAGNOSIS — G309 Alzheimer's disease, unspecified: Secondary | ICD-10-CM | POA: Diagnosis not present

## 2020-09-13 DIAGNOSIS — M1990 Unspecified osteoarthritis, unspecified site: Secondary | ICD-10-CM | POA: Diagnosis not present

## 2020-09-13 DIAGNOSIS — N3946 Mixed incontinence: Secondary | ICD-10-CM | POA: Diagnosis not present

## 2020-09-13 DIAGNOSIS — E039 Hypothyroidism, unspecified: Secondary | ICD-10-CM | POA: Diagnosis not present

## 2020-09-13 DIAGNOSIS — J189 Pneumonia, unspecified organism: Secondary | ICD-10-CM | POA: Diagnosis not present

## 2020-09-13 DIAGNOSIS — E785 Hyperlipidemia, unspecified: Secondary | ICD-10-CM | POA: Diagnosis not present

## 2020-09-15 DIAGNOSIS — J189 Pneumonia, unspecified organism: Secondary | ICD-10-CM | POA: Diagnosis not present

## 2020-09-15 DIAGNOSIS — E039 Hypothyroidism, unspecified: Secondary | ICD-10-CM | POA: Diagnosis not present

## 2020-09-15 DIAGNOSIS — E785 Hyperlipidemia, unspecified: Secondary | ICD-10-CM | POA: Diagnosis not present

## 2020-09-15 DIAGNOSIS — N3946 Mixed incontinence: Secondary | ICD-10-CM | POA: Diagnosis not present

## 2020-09-15 DIAGNOSIS — G309 Alzheimer's disease, unspecified: Secondary | ICD-10-CM | POA: Diagnosis not present

## 2020-09-15 DIAGNOSIS — M1990 Unspecified osteoarthritis, unspecified site: Secondary | ICD-10-CM | POA: Diagnosis not present

## 2020-09-20 DIAGNOSIS — E785 Hyperlipidemia, unspecified: Secondary | ICD-10-CM | POA: Diagnosis not present

## 2020-09-20 DIAGNOSIS — E039 Hypothyroidism, unspecified: Secondary | ICD-10-CM | POA: Diagnosis not present

## 2020-09-20 DIAGNOSIS — M1990 Unspecified osteoarthritis, unspecified site: Secondary | ICD-10-CM | POA: Diagnosis not present

## 2020-09-20 DIAGNOSIS — J189 Pneumonia, unspecified organism: Secondary | ICD-10-CM | POA: Diagnosis not present

## 2020-09-20 DIAGNOSIS — G309 Alzheimer's disease, unspecified: Secondary | ICD-10-CM | POA: Diagnosis not present

## 2020-09-20 DIAGNOSIS — N3946 Mixed incontinence: Secondary | ICD-10-CM | POA: Diagnosis not present

## 2020-09-23 DIAGNOSIS — J189 Pneumonia, unspecified organism: Secondary | ICD-10-CM | POA: Diagnosis not present

## 2020-09-23 DIAGNOSIS — M1990 Unspecified osteoarthritis, unspecified site: Secondary | ICD-10-CM | POA: Diagnosis not present

## 2020-09-23 DIAGNOSIS — G309 Alzheimer's disease, unspecified: Secondary | ICD-10-CM | POA: Diagnosis not present

## 2020-09-23 DIAGNOSIS — E785 Hyperlipidemia, unspecified: Secondary | ICD-10-CM | POA: Diagnosis not present

## 2020-09-23 DIAGNOSIS — E039 Hypothyroidism, unspecified: Secondary | ICD-10-CM | POA: Diagnosis not present

## 2020-09-23 DIAGNOSIS — N3946 Mixed incontinence: Secondary | ICD-10-CM | POA: Diagnosis not present

## 2020-09-27 DIAGNOSIS — E785 Hyperlipidemia, unspecified: Secondary | ICD-10-CM | POA: Diagnosis not present

## 2020-09-27 DIAGNOSIS — N3946 Mixed incontinence: Secondary | ICD-10-CM | POA: Diagnosis not present

## 2020-09-27 DIAGNOSIS — E039 Hypothyroidism, unspecified: Secondary | ICD-10-CM | POA: Diagnosis not present

## 2020-09-27 DIAGNOSIS — J189 Pneumonia, unspecified organism: Secondary | ICD-10-CM | POA: Diagnosis not present

## 2020-09-27 DIAGNOSIS — M1990 Unspecified osteoarthritis, unspecified site: Secondary | ICD-10-CM | POA: Diagnosis not present

## 2020-09-27 DIAGNOSIS — G309 Alzheimer's disease, unspecified: Secondary | ICD-10-CM | POA: Diagnosis not present

## 2020-09-28 DIAGNOSIS — G309 Alzheimer's disease, unspecified: Secondary | ICD-10-CM | POA: Diagnosis not present

## 2020-09-28 DIAGNOSIS — M1990 Unspecified osteoarthritis, unspecified site: Secondary | ICD-10-CM | POA: Diagnosis not present

## 2020-09-28 DIAGNOSIS — N3946 Mixed incontinence: Secondary | ICD-10-CM | POA: Diagnosis not present

## 2020-09-28 DIAGNOSIS — R531 Weakness: Secondary | ICD-10-CM | POA: Diagnosis not present

## 2020-09-28 DIAGNOSIS — E785 Hyperlipidemia, unspecified: Secondary | ICD-10-CM | POA: Diagnosis not present

## 2020-09-28 DIAGNOSIS — E039 Hypothyroidism, unspecified: Secondary | ICD-10-CM | POA: Diagnosis not present

## 2020-09-28 DIAGNOSIS — R0602 Shortness of breath: Secondary | ICD-10-CM | POA: Diagnosis not present

## 2020-09-28 DIAGNOSIS — J189 Pneumonia, unspecified organism: Secondary | ICD-10-CM | POA: Diagnosis not present

## 2020-09-29 DIAGNOSIS — J189 Pneumonia, unspecified organism: Secondary | ICD-10-CM | POA: Diagnosis not present

## 2020-09-29 DIAGNOSIS — E785 Hyperlipidemia, unspecified: Secondary | ICD-10-CM | POA: Diagnosis not present

## 2020-09-29 DIAGNOSIS — G309 Alzheimer's disease, unspecified: Secondary | ICD-10-CM | POA: Diagnosis not present

## 2020-09-29 DIAGNOSIS — M1990 Unspecified osteoarthritis, unspecified site: Secondary | ICD-10-CM | POA: Diagnosis not present

## 2020-09-29 DIAGNOSIS — E039 Hypothyroidism, unspecified: Secondary | ICD-10-CM | POA: Diagnosis not present

## 2020-09-29 DIAGNOSIS — N3946 Mixed incontinence: Secondary | ICD-10-CM | POA: Diagnosis not present

## 2020-09-30 DIAGNOSIS — N3946 Mixed incontinence: Secondary | ICD-10-CM | POA: Diagnosis not present

## 2020-09-30 DIAGNOSIS — M1990 Unspecified osteoarthritis, unspecified site: Secondary | ICD-10-CM | POA: Diagnosis not present

## 2020-09-30 DIAGNOSIS — E785 Hyperlipidemia, unspecified: Secondary | ICD-10-CM | POA: Diagnosis not present

## 2020-09-30 DIAGNOSIS — E039 Hypothyroidism, unspecified: Secondary | ICD-10-CM | POA: Diagnosis not present

## 2020-09-30 DIAGNOSIS — J189 Pneumonia, unspecified organism: Secondary | ICD-10-CM | POA: Diagnosis not present

## 2020-09-30 DIAGNOSIS — G309 Alzheimer's disease, unspecified: Secondary | ICD-10-CM | POA: Diagnosis not present

## 2020-10-03 DIAGNOSIS — J189 Pneumonia, unspecified organism: Secondary | ICD-10-CM | POA: Diagnosis not present

## 2020-10-03 DIAGNOSIS — G309 Alzheimer's disease, unspecified: Secondary | ICD-10-CM | POA: Diagnosis not present

## 2020-10-03 DIAGNOSIS — E785 Hyperlipidemia, unspecified: Secondary | ICD-10-CM | POA: Diagnosis not present

## 2020-10-03 DIAGNOSIS — E039 Hypothyroidism, unspecified: Secondary | ICD-10-CM | POA: Diagnosis not present

## 2020-10-03 DIAGNOSIS — M1990 Unspecified osteoarthritis, unspecified site: Secondary | ICD-10-CM | POA: Diagnosis not present

## 2020-10-03 DIAGNOSIS — N3946 Mixed incontinence: Secondary | ICD-10-CM | POA: Diagnosis not present

## 2020-10-04 DIAGNOSIS — E785 Hyperlipidemia, unspecified: Secondary | ICD-10-CM | POA: Diagnosis not present

## 2020-10-04 DIAGNOSIS — E039 Hypothyroidism, unspecified: Secondary | ICD-10-CM | POA: Diagnosis not present

## 2020-10-04 DIAGNOSIS — M1990 Unspecified osteoarthritis, unspecified site: Secondary | ICD-10-CM | POA: Diagnosis not present

## 2020-10-04 DIAGNOSIS — J189 Pneumonia, unspecified organism: Secondary | ICD-10-CM | POA: Diagnosis not present

## 2020-10-04 DIAGNOSIS — N3946 Mixed incontinence: Secondary | ICD-10-CM | POA: Diagnosis not present

## 2020-10-04 DIAGNOSIS — G309 Alzheimer's disease, unspecified: Secondary | ICD-10-CM | POA: Diagnosis not present

## 2020-10-05 DIAGNOSIS — J189 Pneumonia, unspecified organism: Secondary | ICD-10-CM | POA: Diagnosis not present

## 2020-10-05 DIAGNOSIS — N3946 Mixed incontinence: Secondary | ICD-10-CM | POA: Diagnosis not present

## 2020-10-05 DIAGNOSIS — M1990 Unspecified osteoarthritis, unspecified site: Secondary | ICD-10-CM | POA: Diagnosis not present

## 2020-10-05 DIAGNOSIS — E785 Hyperlipidemia, unspecified: Secondary | ICD-10-CM | POA: Diagnosis not present

## 2020-10-05 DIAGNOSIS — G309 Alzheimer's disease, unspecified: Secondary | ICD-10-CM | POA: Diagnosis not present

## 2020-10-05 DIAGNOSIS — E039 Hypothyroidism, unspecified: Secondary | ICD-10-CM | POA: Diagnosis not present

## 2020-10-06 DIAGNOSIS — G309 Alzheimer's disease, unspecified: Secondary | ICD-10-CM | POA: Diagnosis not present

## 2020-10-06 DIAGNOSIS — J189 Pneumonia, unspecified organism: Secondary | ICD-10-CM | POA: Diagnosis not present

## 2020-10-06 DIAGNOSIS — E039 Hypothyroidism, unspecified: Secondary | ICD-10-CM | POA: Diagnosis not present

## 2020-10-06 DIAGNOSIS — M1990 Unspecified osteoarthritis, unspecified site: Secondary | ICD-10-CM | POA: Diagnosis not present

## 2020-10-06 DIAGNOSIS — N3946 Mixed incontinence: Secondary | ICD-10-CM | POA: Diagnosis not present

## 2020-10-06 DIAGNOSIS — E785 Hyperlipidemia, unspecified: Secondary | ICD-10-CM | POA: Diagnosis not present

## 2020-10-10 DIAGNOSIS — E785 Hyperlipidemia, unspecified: Secondary | ICD-10-CM | POA: Diagnosis not present

## 2020-10-10 DIAGNOSIS — G309 Alzheimer's disease, unspecified: Secondary | ICD-10-CM | POA: Diagnosis not present

## 2020-10-10 DIAGNOSIS — E039 Hypothyroidism, unspecified: Secondary | ICD-10-CM | POA: Diagnosis not present

## 2020-10-10 DIAGNOSIS — J189 Pneumonia, unspecified organism: Secondary | ICD-10-CM | POA: Diagnosis not present

## 2020-10-10 DIAGNOSIS — N3946 Mixed incontinence: Secondary | ICD-10-CM | POA: Diagnosis not present

## 2020-10-10 DIAGNOSIS — M1990 Unspecified osteoarthritis, unspecified site: Secondary | ICD-10-CM | POA: Diagnosis not present

## 2020-10-11 DIAGNOSIS — M1990 Unspecified osteoarthritis, unspecified site: Secondary | ICD-10-CM | POA: Diagnosis not present

## 2020-10-11 DIAGNOSIS — G309 Alzheimer's disease, unspecified: Secondary | ICD-10-CM | POA: Diagnosis not present

## 2020-10-11 DIAGNOSIS — E039 Hypothyroidism, unspecified: Secondary | ICD-10-CM | POA: Diagnosis not present

## 2020-10-11 DIAGNOSIS — E785 Hyperlipidemia, unspecified: Secondary | ICD-10-CM | POA: Diagnosis not present

## 2020-10-11 DIAGNOSIS — N3946 Mixed incontinence: Secondary | ICD-10-CM | POA: Diagnosis not present

## 2020-10-11 DIAGNOSIS — J189 Pneumonia, unspecified organism: Secondary | ICD-10-CM | POA: Diagnosis not present

## 2020-10-12 DIAGNOSIS — E039 Hypothyroidism, unspecified: Secondary | ICD-10-CM | POA: Diagnosis not present

## 2020-10-12 DIAGNOSIS — J189 Pneumonia, unspecified organism: Secondary | ICD-10-CM | POA: Diagnosis not present

## 2020-10-12 DIAGNOSIS — N3946 Mixed incontinence: Secondary | ICD-10-CM | POA: Diagnosis not present

## 2020-10-12 DIAGNOSIS — M1990 Unspecified osteoarthritis, unspecified site: Secondary | ICD-10-CM | POA: Diagnosis not present

## 2020-10-12 DIAGNOSIS — G309 Alzheimer's disease, unspecified: Secondary | ICD-10-CM | POA: Diagnosis not present

## 2020-10-12 DIAGNOSIS — E785 Hyperlipidemia, unspecified: Secondary | ICD-10-CM | POA: Diagnosis not present

## 2020-10-13 DIAGNOSIS — N3946 Mixed incontinence: Secondary | ICD-10-CM | POA: Diagnosis not present

## 2020-10-13 DIAGNOSIS — E785 Hyperlipidemia, unspecified: Secondary | ICD-10-CM | POA: Diagnosis not present

## 2020-10-13 DIAGNOSIS — E039 Hypothyroidism, unspecified: Secondary | ICD-10-CM | POA: Diagnosis not present

## 2020-10-13 DIAGNOSIS — G309 Alzheimer's disease, unspecified: Secondary | ICD-10-CM | POA: Diagnosis not present

## 2020-10-13 DIAGNOSIS — M1990 Unspecified osteoarthritis, unspecified site: Secondary | ICD-10-CM | POA: Diagnosis not present

## 2020-10-13 DIAGNOSIS — J189 Pneumonia, unspecified organism: Secondary | ICD-10-CM | POA: Diagnosis not present

## 2020-10-14 DIAGNOSIS — J189 Pneumonia, unspecified organism: Secondary | ICD-10-CM | POA: Diagnosis not present

## 2020-10-14 DIAGNOSIS — G309 Alzheimer's disease, unspecified: Secondary | ICD-10-CM | POA: Diagnosis not present

## 2020-10-14 DIAGNOSIS — E785 Hyperlipidemia, unspecified: Secondary | ICD-10-CM | POA: Diagnosis not present

## 2020-10-14 DIAGNOSIS — E039 Hypothyroidism, unspecified: Secondary | ICD-10-CM | POA: Diagnosis not present

## 2020-10-14 DIAGNOSIS — N3946 Mixed incontinence: Secondary | ICD-10-CM | POA: Diagnosis not present

## 2020-10-14 DIAGNOSIS — M1990 Unspecified osteoarthritis, unspecified site: Secondary | ICD-10-CM | POA: Diagnosis not present

## 2020-10-17 DIAGNOSIS — M1990 Unspecified osteoarthritis, unspecified site: Secondary | ICD-10-CM | POA: Diagnosis not present

## 2020-10-17 DIAGNOSIS — E039 Hypothyroidism, unspecified: Secondary | ICD-10-CM | POA: Diagnosis not present

## 2020-10-17 DIAGNOSIS — N3946 Mixed incontinence: Secondary | ICD-10-CM | POA: Diagnosis not present

## 2020-10-17 DIAGNOSIS — G309 Alzheimer's disease, unspecified: Secondary | ICD-10-CM | POA: Diagnosis not present

## 2020-10-17 DIAGNOSIS — J189 Pneumonia, unspecified organism: Secondary | ICD-10-CM | POA: Diagnosis not present

## 2020-10-17 DIAGNOSIS — E785 Hyperlipidemia, unspecified: Secondary | ICD-10-CM | POA: Diagnosis not present

## 2020-10-18 DIAGNOSIS — G309 Alzheimer's disease, unspecified: Secondary | ICD-10-CM | POA: Diagnosis not present

## 2020-10-18 DIAGNOSIS — E785 Hyperlipidemia, unspecified: Secondary | ICD-10-CM | POA: Diagnosis not present

## 2020-10-18 DIAGNOSIS — N3946 Mixed incontinence: Secondary | ICD-10-CM | POA: Diagnosis not present

## 2020-10-18 DIAGNOSIS — J189 Pneumonia, unspecified organism: Secondary | ICD-10-CM | POA: Diagnosis not present

## 2020-10-18 DIAGNOSIS — E039 Hypothyroidism, unspecified: Secondary | ICD-10-CM | POA: Diagnosis not present

## 2020-10-18 DIAGNOSIS — M1990 Unspecified osteoarthritis, unspecified site: Secondary | ICD-10-CM | POA: Diagnosis not present

## 2020-10-19 DIAGNOSIS — E785 Hyperlipidemia, unspecified: Secondary | ICD-10-CM | POA: Diagnosis not present

## 2020-10-19 DIAGNOSIS — G309 Alzheimer's disease, unspecified: Secondary | ICD-10-CM | POA: Diagnosis not present

## 2020-10-19 DIAGNOSIS — N3946 Mixed incontinence: Secondary | ICD-10-CM | POA: Diagnosis not present

## 2020-10-19 DIAGNOSIS — M1990 Unspecified osteoarthritis, unspecified site: Secondary | ICD-10-CM | POA: Diagnosis not present

## 2020-10-19 DIAGNOSIS — E039 Hypothyroidism, unspecified: Secondary | ICD-10-CM | POA: Diagnosis not present

## 2020-10-19 DIAGNOSIS — J189 Pneumonia, unspecified organism: Secondary | ICD-10-CM | POA: Diagnosis not present

## 2020-10-20 DIAGNOSIS — J189 Pneumonia, unspecified organism: Secondary | ICD-10-CM | POA: Diagnosis not present

## 2020-10-20 DIAGNOSIS — E785 Hyperlipidemia, unspecified: Secondary | ICD-10-CM | POA: Diagnosis not present

## 2020-10-20 DIAGNOSIS — E039 Hypothyroidism, unspecified: Secondary | ICD-10-CM | POA: Diagnosis not present

## 2020-10-20 DIAGNOSIS — N3946 Mixed incontinence: Secondary | ICD-10-CM | POA: Diagnosis not present

## 2020-10-20 DIAGNOSIS — G309 Alzheimer's disease, unspecified: Secondary | ICD-10-CM | POA: Diagnosis not present

## 2020-10-20 DIAGNOSIS — M1990 Unspecified osteoarthritis, unspecified site: Secondary | ICD-10-CM | POA: Diagnosis not present

## 2020-10-24 DIAGNOSIS — G309 Alzheimer's disease, unspecified: Secondary | ICD-10-CM | POA: Diagnosis not present

## 2020-10-24 DIAGNOSIS — E785 Hyperlipidemia, unspecified: Secondary | ICD-10-CM | POA: Diagnosis not present

## 2020-10-24 DIAGNOSIS — J189 Pneumonia, unspecified organism: Secondary | ICD-10-CM | POA: Diagnosis not present

## 2020-10-24 DIAGNOSIS — N3946 Mixed incontinence: Secondary | ICD-10-CM | POA: Diagnosis not present

## 2020-10-24 DIAGNOSIS — M1990 Unspecified osteoarthritis, unspecified site: Secondary | ICD-10-CM | POA: Diagnosis not present

## 2020-10-24 DIAGNOSIS — E039 Hypothyroidism, unspecified: Secondary | ICD-10-CM | POA: Diagnosis not present

## 2020-10-25 DIAGNOSIS — E039 Hypothyroidism, unspecified: Secondary | ICD-10-CM | POA: Diagnosis not present

## 2020-10-25 DIAGNOSIS — M1990 Unspecified osteoarthritis, unspecified site: Secondary | ICD-10-CM | POA: Diagnosis not present

## 2020-10-25 DIAGNOSIS — E785 Hyperlipidemia, unspecified: Secondary | ICD-10-CM | POA: Diagnosis not present

## 2020-10-25 DIAGNOSIS — J189 Pneumonia, unspecified organism: Secondary | ICD-10-CM | POA: Diagnosis not present

## 2020-10-25 DIAGNOSIS — N3946 Mixed incontinence: Secondary | ICD-10-CM | POA: Diagnosis not present

## 2020-10-25 DIAGNOSIS — G309 Alzheimer's disease, unspecified: Secondary | ICD-10-CM | POA: Diagnosis not present

## 2020-10-26 DIAGNOSIS — J189 Pneumonia, unspecified organism: Secondary | ICD-10-CM | POA: Diagnosis not present

## 2020-10-26 DIAGNOSIS — G309 Alzheimer's disease, unspecified: Secondary | ICD-10-CM | POA: Diagnosis not present

## 2020-10-26 DIAGNOSIS — E039 Hypothyroidism, unspecified: Secondary | ICD-10-CM | POA: Diagnosis not present

## 2020-10-26 DIAGNOSIS — E785 Hyperlipidemia, unspecified: Secondary | ICD-10-CM | POA: Diagnosis not present

## 2020-10-26 DIAGNOSIS — N3946 Mixed incontinence: Secondary | ICD-10-CM | POA: Diagnosis not present

## 2020-10-26 DIAGNOSIS — M1990 Unspecified osteoarthritis, unspecified site: Secondary | ICD-10-CM | POA: Diagnosis not present

## 2020-10-27 DIAGNOSIS — E785 Hyperlipidemia, unspecified: Secondary | ICD-10-CM | POA: Diagnosis not present

## 2020-10-27 DIAGNOSIS — G309 Alzheimer's disease, unspecified: Secondary | ICD-10-CM | POA: Diagnosis not present

## 2020-10-27 DIAGNOSIS — E039 Hypothyroidism, unspecified: Secondary | ICD-10-CM | POA: Diagnosis not present

## 2020-10-27 DIAGNOSIS — M1990 Unspecified osteoarthritis, unspecified site: Secondary | ICD-10-CM | POA: Diagnosis not present

## 2020-10-27 DIAGNOSIS — J189 Pneumonia, unspecified organism: Secondary | ICD-10-CM | POA: Diagnosis not present

## 2020-10-27 DIAGNOSIS — N3946 Mixed incontinence: Secondary | ICD-10-CM | POA: Diagnosis not present

## 2020-10-29 DIAGNOSIS — M1990 Unspecified osteoarthritis, unspecified site: Secondary | ICD-10-CM | POA: Diagnosis not present

## 2020-10-29 DIAGNOSIS — R531 Weakness: Secondary | ICD-10-CM | POA: Diagnosis not present

## 2020-10-29 DIAGNOSIS — N3946 Mixed incontinence: Secondary | ICD-10-CM | POA: Diagnosis not present

## 2020-10-29 DIAGNOSIS — E039 Hypothyroidism, unspecified: Secondary | ICD-10-CM | POA: Diagnosis not present

## 2020-10-29 DIAGNOSIS — E785 Hyperlipidemia, unspecified: Secondary | ICD-10-CM | POA: Diagnosis not present

## 2020-10-29 DIAGNOSIS — G309 Alzheimer's disease, unspecified: Secondary | ICD-10-CM | POA: Diagnosis not present

## 2020-10-29 DIAGNOSIS — R0602 Shortness of breath: Secondary | ICD-10-CM | POA: Diagnosis not present

## 2020-10-29 DIAGNOSIS — J189 Pneumonia, unspecified organism: Secondary | ICD-10-CM | POA: Diagnosis not present

## 2020-11-01 DIAGNOSIS — G309 Alzheimer's disease, unspecified: Secondary | ICD-10-CM | POA: Diagnosis not present

## 2020-11-01 DIAGNOSIS — J189 Pneumonia, unspecified organism: Secondary | ICD-10-CM | POA: Diagnosis not present

## 2020-11-01 DIAGNOSIS — E785 Hyperlipidemia, unspecified: Secondary | ICD-10-CM | POA: Diagnosis not present

## 2020-11-01 DIAGNOSIS — M1990 Unspecified osteoarthritis, unspecified site: Secondary | ICD-10-CM | POA: Diagnosis not present

## 2020-11-01 DIAGNOSIS — E039 Hypothyroidism, unspecified: Secondary | ICD-10-CM | POA: Diagnosis not present

## 2020-11-01 DIAGNOSIS — N3946 Mixed incontinence: Secondary | ICD-10-CM | POA: Diagnosis not present

## 2020-11-02 DIAGNOSIS — J189 Pneumonia, unspecified organism: Secondary | ICD-10-CM | POA: Diagnosis not present

## 2020-11-02 DIAGNOSIS — E785 Hyperlipidemia, unspecified: Secondary | ICD-10-CM | POA: Diagnosis not present

## 2020-11-02 DIAGNOSIS — M1990 Unspecified osteoarthritis, unspecified site: Secondary | ICD-10-CM | POA: Diagnosis not present

## 2020-11-02 DIAGNOSIS — E039 Hypothyroidism, unspecified: Secondary | ICD-10-CM | POA: Diagnosis not present

## 2020-11-02 DIAGNOSIS — G309 Alzheimer's disease, unspecified: Secondary | ICD-10-CM | POA: Diagnosis not present

## 2020-11-02 DIAGNOSIS — N3946 Mixed incontinence: Secondary | ICD-10-CM | POA: Diagnosis not present

## 2020-11-03 DIAGNOSIS — N3946 Mixed incontinence: Secondary | ICD-10-CM | POA: Diagnosis not present

## 2020-11-03 DIAGNOSIS — G309 Alzheimer's disease, unspecified: Secondary | ICD-10-CM | POA: Diagnosis not present

## 2020-11-03 DIAGNOSIS — E039 Hypothyroidism, unspecified: Secondary | ICD-10-CM | POA: Diagnosis not present

## 2020-11-03 DIAGNOSIS — J189 Pneumonia, unspecified organism: Secondary | ICD-10-CM | POA: Diagnosis not present

## 2020-11-03 DIAGNOSIS — E785 Hyperlipidemia, unspecified: Secondary | ICD-10-CM | POA: Diagnosis not present

## 2020-11-03 DIAGNOSIS — M1990 Unspecified osteoarthritis, unspecified site: Secondary | ICD-10-CM | POA: Diagnosis not present

## 2020-11-04 DIAGNOSIS — J189 Pneumonia, unspecified organism: Secondary | ICD-10-CM | POA: Diagnosis not present

## 2020-11-04 DIAGNOSIS — E785 Hyperlipidemia, unspecified: Secondary | ICD-10-CM | POA: Diagnosis not present

## 2020-11-04 DIAGNOSIS — N3946 Mixed incontinence: Secondary | ICD-10-CM | POA: Diagnosis not present

## 2020-11-04 DIAGNOSIS — G309 Alzheimer's disease, unspecified: Secondary | ICD-10-CM | POA: Diagnosis not present

## 2020-11-04 DIAGNOSIS — E039 Hypothyroidism, unspecified: Secondary | ICD-10-CM | POA: Diagnosis not present

## 2020-11-04 DIAGNOSIS — M1990 Unspecified osteoarthritis, unspecified site: Secondary | ICD-10-CM | POA: Diagnosis not present

## 2020-11-07 DIAGNOSIS — E039 Hypothyroidism, unspecified: Secondary | ICD-10-CM | POA: Diagnosis not present

## 2020-11-07 DIAGNOSIS — G309 Alzheimer's disease, unspecified: Secondary | ICD-10-CM | POA: Diagnosis not present

## 2020-11-07 DIAGNOSIS — M1990 Unspecified osteoarthritis, unspecified site: Secondary | ICD-10-CM | POA: Diagnosis not present

## 2020-11-07 DIAGNOSIS — N3946 Mixed incontinence: Secondary | ICD-10-CM | POA: Diagnosis not present

## 2020-11-07 DIAGNOSIS — J189 Pneumonia, unspecified organism: Secondary | ICD-10-CM | POA: Diagnosis not present

## 2020-11-07 DIAGNOSIS — E785 Hyperlipidemia, unspecified: Secondary | ICD-10-CM | POA: Diagnosis not present

## 2020-11-08 DIAGNOSIS — M1990 Unspecified osteoarthritis, unspecified site: Secondary | ICD-10-CM | POA: Diagnosis not present

## 2020-11-08 DIAGNOSIS — E039 Hypothyroidism, unspecified: Secondary | ICD-10-CM | POA: Diagnosis not present

## 2020-11-08 DIAGNOSIS — N3946 Mixed incontinence: Secondary | ICD-10-CM | POA: Diagnosis not present

## 2020-11-08 DIAGNOSIS — E785 Hyperlipidemia, unspecified: Secondary | ICD-10-CM | POA: Diagnosis not present

## 2020-11-08 DIAGNOSIS — J189 Pneumonia, unspecified organism: Secondary | ICD-10-CM | POA: Diagnosis not present

## 2020-11-08 DIAGNOSIS — G309 Alzheimer's disease, unspecified: Secondary | ICD-10-CM | POA: Diagnosis not present

## 2020-11-09 DIAGNOSIS — N3946 Mixed incontinence: Secondary | ICD-10-CM | POA: Diagnosis not present

## 2020-11-09 DIAGNOSIS — J189 Pneumonia, unspecified organism: Secondary | ICD-10-CM | POA: Diagnosis not present

## 2020-11-09 DIAGNOSIS — E785 Hyperlipidemia, unspecified: Secondary | ICD-10-CM | POA: Diagnosis not present

## 2020-11-09 DIAGNOSIS — M1990 Unspecified osteoarthritis, unspecified site: Secondary | ICD-10-CM | POA: Diagnosis not present

## 2020-11-09 DIAGNOSIS — G309 Alzheimer's disease, unspecified: Secondary | ICD-10-CM | POA: Diagnosis not present

## 2020-11-09 DIAGNOSIS — E039 Hypothyroidism, unspecified: Secondary | ICD-10-CM | POA: Diagnosis not present

## 2020-11-10 DIAGNOSIS — M1990 Unspecified osteoarthritis, unspecified site: Secondary | ICD-10-CM | POA: Diagnosis not present

## 2020-11-10 DIAGNOSIS — J189 Pneumonia, unspecified organism: Secondary | ICD-10-CM | POA: Diagnosis not present

## 2020-11-10 DIAGNOSIS — N3946 Mixed incontinence: Secondary | ICD-10-CM | POA: Diagnosis not present

## 2020-11-10 DIAGNOSIS — E039 Hypothyroidism, unspecified: Secondary | ICD-10-CM | POA: Diagnosis not present

## 2020-11-10 DIAGNOSIS — E785 Hyperlipidemia, unspecified: Secondary | ICD-10-CM | POA: Diagnosis not present

## 2020-11-10 DIAGNOSIS — G309 Alzheimer's disease, unspecified: Secondary | ICD-10-CM | POA: Diagnosis not present

## 2020-11-11 DIAGNOSIS — N3946 Mixed incontinence: Secondary | ICD-10-CM | POA: Diagnosis not present

## 2020-11-11 DIAGNOSIS — J189 Pneumonia, unspecified organism: Secondary | ICD-10-CM | POA: Diagnosis not present

## 2020-11-11 DIAGNOSIS — M1990 Unspecified osteoarthritis, unspecified site: Secondary | ICD-10-CM | POA: Diagnosis not present

## 2020-11-11 DIAGNOSIS — G309 Alzheimer's disease, unspecified: Secondary | ICD-10-CM | POA: Diagnosis not present

## 2020-11-11 DIAGNOSIS — E785 Hyperlipidemia, unspecified: Secondary | ICD-10-CM | POA: Diagnosis not present

## 2020-11-11 DIAGNOSIS — E039 Hypothyroidism, unspecified: Secondary | ICD-10-CM | POA: Diagnosis not present

## 2020-11-15 DIAGNOSIS — J189 Pneumonia, unspecified organism: Secondary | ICD-10-CM | POA: Diagnosis not present

## 2020-11-15 DIAGNOSIS — M1990 Unspecified osteoarthritis, unspecified site: Secondary | ICD-10-CM | POA: Diagnosis not present

## 2020-11-15 DIAGNOSIS — N3946 Mixed incontinence: Secondary | ICD-10-CM | POA: Diagnosis not present

## 2020-11-15 DIAGNOSIS — G309 Alzheimer's disease, unspecified: Secondary | ICD-10-CM | POA: Diagnosis not present

## 2020-11-15 DIAGNOSIS — E785 Hyperlipidemia, unspecified: Secondary | ICD-10-CM | POA: Diagnosis not present

## 2020-11-15 DIAGNOSIS — E039 Hypothyroidism, unspecified: Secondary | ICD-10-CM | POA: Diagnosis not present

## 2020-11-17 DIAGNOSIS — N3946 Mixed incontinence: Secondary | ICD-10-CM | POA: Diagnosis not present

## 2020-11-17 DIAGNOSIS — E039 Hypothyroidism, unspecified: Secondary | ICD-10-CM | POA: Diagnosis not present

## 2020-11-17 DIAGNOSIS — J189 Pneumonia, unspecified organism: Secondary | ICD-10-CM | POA: Diagnosis not present

## 2020-11-17 DIAGNOSIS — E785 Hyperlipidemia, unspecified: Secondary | ICD-10-CM | POA: Diagnosis not present

## 2020-11-17 DIAGNOSIS — G309 Alzheimer's disease, unspecified: Secondary | ICD-10-CM | POA: Diagnosis not present

## 2020-11-17 DIAGNOSIS — M1990 Unspecified osteoarthritis, unspecified site: Secondary | ICD-10-CM | POA: Diagnosis not present

## 2020-11-18 DIAGNOSIS — M1990 Unspecified osteoarthritis, unspecified site: Secondary | ICD-10-CM | POA: Diagnosis not present

## 2020-11-18 DIAGNOSIS — N3946 Mixed incontinence: Secondary | ICD-10-CM | POA: Diagnosis not present

## 2020-11-18 DIAGNOSIS — G309 Alzheimer's disease, unspecified: Secondary | ICD-10-CM | POA: Diagnosis not present

## 2020-11-18 DIAGNOSIS — J189 Pneumonia, unspecified organism: Secondary | ICD-10-CM | POA: Diagnosis not present

## 2020-11-18 DIAGNOSIS — E785 Hyperlipidemia, unspecified: Secondary | ICD-10-CM | POA: Diagnosis not present

## 2020-11-18 DIAGNOSIS — E039 Hypothyroidism, unspecified: Secondary | ICD-10-CM | POA: Diagnosis not present

## 2020-11-21 DIAGNOSIS — M1990 Unspecified osteoarthritis, unspecified site: Secondary | ICD-10-CM | POA: Diagnosis not present

## 2020-11-21 DIAGNOSIS — N3946 Mixed incontinence: Secondary | ICD-10-CM | POA: Diagnosis not present

## 2020-11-21 DIAGNOSIS — J189 Pneumonia, unspecified organism: Secondary | ICD-10-CM | POA: Diagnosis not present

## 2020-11-21 DIAGNOSIS — E785 Hyperlipidemia, unspecified: Secondary | ICD-10-CM | POA: Diagnosis not present

## 2020-11-21 DIAGNOSIS — E039 Hypothyroidism, unspecified: Secondary | ICD-10-CM | POA: Diagnosis not present

## 2020-11-21 DIAGNOSIS — G309 Alzheimer's disease, unspecified: Secondary | ICD-10-CM | POA: Diagnosis not present

## 2020-11-22 DIAGNOSIS — E785 Hyperlipidemia, unspecified: Secondary | ICD-10-CM | POA: Diagnosis not present

## 2020-11-22 DIAGNOSIS — J189 Pneumonia, unspecified organism: Secondary | ICD-10-CM | POA: Diagnosis not present

## 2020-11-22 DIAGNOSIS — N3946 Mixed incontinence: Secondary | ICD-10-CM | POA: Diagnosis not present

## 2020-11-22 DIAGNOSIS — G309 Alzheimer's disease, unspecified: Secondary | ICD-10-CM | POA: Diagnosis not present

## 2020-11-22 DIAGNOSIS — E039 Hypothyroidism, unspecified: Secondary | ICD-10-CM | POA: Diagnosis not present

## 2020-11-22 DIAGNOSIS — M1990 Unspecified osteoarthritis, unspecified site: Secondary | ICD-10-CM | POA: Diagnosis not present

## 2020-11-23 DIAGNOSIS — M1990 Unspecified osteoarthritis, unspecified site: Secondary | ICD-10-CM | POA: Diagnosis not present

## 2020-11-23 DIAGNOSIS — E785 Hyperlipidemia, unspecified: Secondary | ICD-10-CM | POA: Diagnosis not present

## 2020-11-23 DIAGNOSIS — N3946 Mixed incontinence: Secondary | ICD-10-CM | POA: Diagnosis not present

## 2020-11-23 DIAGNOSIS — G309 Alzheimer's disease, unspecified: Secondary | ICD-10-CM | POA: Diagnosis not present

## 2020-11-23 DIAGNOSIS — J189 Pneumonia, unspecified organism: Secondary | ICD-10-CM | POA: Diagnosis not present

## 2020-11-23 DIAGNOSIS — E039 Hypothyroidism, unspecified: Secondary | ICD-10-CM | POA: Diagnosis not present

## 2020-11-24 DIAGNOSIS — N3946 Mixed incontinence: Secondary | ICD-10-CM | POA: Diagnosis not present

## 2020-11-24 DIAGNOSIS — E039 Hypothyroidism, unspecified: Secondary | ICD-10-CM | POA: Diagnosis not present

## 2020-11-24 DIAGNOSIS — M1990 Unspecified osteoarthritis, unspecified site: Secondary | ICD-10-CM | POA: Diagnosis not present

## 2020-11-24 DIAGNOSIS — J189 Pneumonia, unspecified organism: Secondary | ICD-10-CM | POA: Diagnosis not present

## 2020-11-24 DIAGNOSIS — G309 Alzheimer's disease, unspecified: Secondary | ICD-10-CM | POA: Diagnosis not present

## 2020-11-24 DIAGNOSIS — E785 Hyperlipidemia, unspecified: Secondary | ICD-10-CM | POA: Diagnosis not present

## 2020-11-25 DIAGNOSIS — N3946 Mixed incontinence: Secondary | ICD-10-CM | POA: Diagnosis not present

## 2020-11-25 DIAGNOSIS — E785 Hyperlipidemia, unspecified: Secondary | ICD-10-CM | POA: Diagnosis not present

## 2020-11-25 DIAGNOSIS — G309 Alzheimer's disease, unspecified: Secondary | ICD-10-CM | POA: Diagnosis not present

## 2020-11-25 DIAGNOSIS — J189 Pneumonia, unspecified organism: Secondary | ICD-10-CM | POA: Diagnosis not present

## 2020-11-25 DIAGNOSIS — E039 Hypothyroidism, unspecified: Secondary | ICD-10-CM | POA: Diagnosis not present

## 2020-11-25 DIAGNOSIS — M1990 Unspecified osteoarthritis, unspecified site: Secondary | ICD-10-CM | POA: Diagnosis not present

## 2020-11-28 DIAGNOSIS — N3946 Mixed incontinence: Secondary | ICD-10-CM | POA: Diagnosis not present

## 2020-11-28 DIAGNOSIS — E039 Hypothyroidism, unspecified: Secondary | ICD-10-CM | POA: Diagnosis not present

## 2020-11-28 DIAGNOSIS — J189 Pneumonia, unspecified organism: Secondary | ICD-10-CM | POA: Diagnosis not present

## 2020-11-28 DIAGNOSIS — E785 Hyperlipidemia, unspecified: Secondary | ICD-10-CM | POA: Diagnosis not present

## 2020-11-28 DIAGNOSIS — G309 Alzheimer's disease, unspecified: Secondary | ICD-10-CM | POA: Diagnosis not present

## 2020-11-28 DIAGNOSIS — M1990 Unspecified osteoarthritis, unspecified site: Secondary | ICD-10-CM | POA: Diagnosis not present

## 2020-11-29 DIAGNOSIS — G309 Alzheimer's disease, unspecified: Secondary | ICD-10-CM | POA: Diagnosis not present

## 2020-11-29 DIAGNOSIS — R0602 Shortness of breath: Secondary | ICD-10-CM | POA: Diagnosis not present

## 2020-11-29 DIAGNOSIS — E039 Hypothyroidism, unspecified: Secondary | ICD-10-CM | POA: Diagnosis not present

## 2020-11-29 DIAGNOSIS — J189 Pneumonia, unspecified organism: Secondary | ICD-10-CM | POA: Diagnosis not present

## 2020-11-29 DIAGNOSIS — E785 Hyperlipidemia, unspecified: Secondary | ICD-10-CM | POA: Diagnosis not present

## 2020-11-29 DIAGNOSIS — M1990 Unspecified osteoarthritis, unspecified site: Secondary | ICD-10-CM | POA: Diagnosis not present

## 2020-11-29 DIAGNOSIS — R531 Weakness: Secondary | ICD-10-CM | POA: Diagnosis not present

## 2020-11-29 DIAGNOSIS — N3946 Mixed incontinence: Secondary | ICD-10-CM | POA: Diagnosis not present

## 2020-11-30 DIAGNOSIS — N3946 Mixed incontinence: Secondary | ICD-10-CM | POA: Diagnosis not present

## 2020-11-30 DIAGNOSIS — M1990 Unspecified osteoarthritis, unspecified site: Secondary | ICD-10-CM | POA: Diagnosis not present

## 2020-11-30 DIAGNOSIS — G309 Alzheimer's disease, unspecified: Secondary | ICD-10-CM | POA: Diagnosis not present

## 2020-11-30 DIAGNOSIS — E785 Hyperlipidemia, unspecified: Secondary | ICD-10-CM | POA: Diagnosis not present

## 2020-11-30 DIAGNOSIS — E039 Hypothyroidism, unspecified: Secondary | ICD-10-CM | POA: Diagnosis not present

## 2020-11-30 DIAGNOSIS — J189 Pneumonia, unspecified organism: Secondary | ICD-10-CM | POA: Diagnosis not present

## 2020-12-01 DIAGNOSIS — N3946 Mixed incontinence: Secondary | ICD-10-CM | POA: Diagnosis not present

## 2020-12-01 DIAGNOSIS — M1990 Unspecified osteoarthritis, unspecified site: Secondary | ICD-10-CM | POA: Diagnosis not present

## 2020-12-01 DIAGNOSIS — G309 Alzheimer's disease, unspecified: Secondary | ICD-10-CM | POA: Diagnosis not present

## 2020-12-01 DIAGNOSIS — E785 Hyperlipidemia, unspecified: Secondary | ICD-10-CM | POA: Diagnosis not present

## 2020-12-01 DIAGNOSIS — E039 Hypothyroidism, unspecified: Secondary | ICD-10-CM | POA: Diagnosis not present

## 2020-12-01 DIAGNOSIS — J189 Pneumonia, unspecified organism: Secondary | ICD-10-CM | POA: Diagnosis not present

## 2020-12-02 DIAGNOSIS — M1990 Unspecified osteoarthritis, unspecified site: Secondary | ICD-10-CM | POA: Diagnosis not present

## 2020-12-02 DIAGNOSIS — E039 Hypothyroidism, unspecified: Secondary | ICD-10-CM | POA: Diagnosis not present

## 2020-12-02 DIAGNOSIS — N3946 Mixed incontinence: Secondary | ICD-10-CM | POA: Diagnosis not present

## 2020-12-02 DIAGNOSIS — E785 Hyperlipidemia, unspecified: Secondary | ICD-10-CM | POA: Diagnosis not present

## 2020-12-02 DIAGNOSIS — J189 Pneumonia, unspecified organism: Secondary | ICD-10-CM | POA: Diagnosis not present

## 2020-12-02 DIAGNOSIS — G309 Alzheimer's disease, unspecified: Secondary | ICD-10-CM | POA: Diagnosis not present

## 2020-12-06 DIAGNOSIS — M1990 Unspecified osteoarthritis, unspecified site: Secondary | ICD-10-CM | POA: Diagnosis not present

## 2020-12-06 DIAGNOSIS — N3946 Mixed incontinence: Secondary | ICD-10-CM | POA: Diagnosis not present

## 2020-12-06 DIAGNOSIS — J189 Pneumonia, unspecified organism: Secondary | ICD-10-CM | POA: Diagnosis not present

## 2020-12-06 DIAGNOSIS — E785 Hyperlipidemia, unspecified: Secondary | ICD-10-CM | POA: Diagnosis not present

## 2020-12-06 DIAGNOSIS — E039 Hypothyroidism, unspecified: Secondary | ICD-10-CM | POA: Diagnosis not present

## 2020-12-06 DIAGNOSIS — G309 Alzheimer's disease, unspecified: Secondary | ICD-10-CM | POA: Diagnosis not present

## 2020-12-07 DIAGNOSIS — E785 Hyperlipidemia, unspecified: Secondary | ICD-10-CM | POA: Diagnosis not present

## 2020-12-07 DIAGNOSIS — M1990 Unspecified osteoarthritis, unspecified site: Secondary | ICD-10-CM | POA: Diagnosis not present

## 2020-12-07 DIAGNOSIS — E039 Hypothyroidism, unspecified: Secondary | ICD-10-CM | POA: Diagnosis not present

## 2020-12-07 DIAGNOSIS — N3946 Mixed incontinence: Secondary | ICD-10-CM | POA: Diagnosis not present

## 2020-12-07 DIAGNOSIS — G309 Alzheimer's disease, unspecified: Secondary | ICD-10-CM | POA: Diagnosis not present

## 2020-12-07 DIAGNOSIS — J189 Pneumonia, unspecified organism: Secondary | ICD-10-CM | POA: Diagnosis not present

## 2020-12-08 DIAGNOSIS — E039 Hypothyroidism, unspecified: Secondary | ICD-10-CM | POA: Diagnosis not present

## 2020-12-08 DIAGNOSIS — G309 Alzheimer's disease, unspecified: Secondary | ICD-10-CM | POA: Diagnosis not present

## 2020-12-08 DIAGNOSIS — J189 Pneumonia, unspecified organism: Secondary | ICD-10-CM | POA: Diagnosis not present

## 2020-12-08 DIAGNOSIS — M1990 Unspecified osteoarthritis, unspecified site: Secondary | ICD-10-CM | POA: Diagnosis not present

## 2020-12-08 DIAGNOSIS — E785 Hyperlipidemia, unspecified: Secondary | ICD-10-CM | POA: Diagnosis not present

## 2020-12-08 DIAGNOSIS — N3946 Mixed incontinence: Secondary | ICD-10-CM | POA: Diagnosis not present

## 2020-12-09 DIAGNOSIS — J189 Pneumonia, unspecified organism: Secondary | ICD-10-CM | POA: Diagnosis not present

## 2020-12-09 DIAGNOSIS — M1990 Unspecified osteoarthritis, unspecified site: Secondary | ICD-10-CM | POA: Diagnosis not present

## 2020-12-09 DIAGNOSIS — G309 Alzheimer's disease, unspecified: Secondary | ICD-10-CM | POA: Diagnosis not present

## 2020-12-09 DIAGNOSIS — E785 Hyperlipidemia, unspecified: Secondary | ICD-10-CM | POA: Diagnosis not present

## 2020-12-09 DIAGNOSIS — N3946 Mixed incontinence: Secondary | ICD-10-CM | POA: Diagnosis not present

## 2020-12-09 DIAGNOSIS — E039 Hypothyroidism, unspecified: Secondary | ICD-10-CM | POA: Diagnosis not present

## 2020-12-12 DIAGNOSIS — E039 Hypothyroidism, unspecified: Secondary | ICD-10-CM | POA: Diagnosis not present

## 2020-12-12 DIAGNOSIS — N3946 Mixed incontinence: Secondary | ICD-10-CM | POA: Diagnosis not present

## 2020-12-12 DIAGNOSIS — M1990 Unspecified osteoarthritis, unspecified site: Secondary | ICD-10-CM | POA: Diagnosis not present

## 2020-12-12 DIAGNOSIS — J189 Pneumonia, unspecified organism: Secondary | ICD-10-CM | POA: Diagnosis not present

## 2020-12-12 DIAGNOSIS — G309 Alzheimer's disease, unspecified: Secondary | ICD-10-CM | POA: Diagnosis not present

## 2020-12-12 DIAGNOSIS — E785 Hyperlipidemia, unspecified: Secondary | ICD-10-CM | POA: Diagnosis not present

## 2020-12-13 DIAGNOSIS — E785 Hyperlipidemia, unspecified: Secondary | ICD-10-CM | POA: Diagnosis not present

## 2020-12-13 DIAGNOSIS — M1990 Unspecified osteoarthritis, unspecified site: Secondary | ICD-10-CM | POA: Diagnosis not present

## 2020-12-13 DIAGNOSIS — G309 Alzheimer's disease, unspecified: Secondary | ICD-10-CM | POA: Diagnosis not present

## 2020-12-13 DIAGNOSIS — N3946 Mixed incontinence: Secondary | ICD-10-CM | POA: Diagnosis not present

## 2020-12-13 DIAGNOSIS — E039 Hypothyroidism, unspecified: Secondary | ICD-10-CM | POA: Diagnosis not present

## 2020-12-13 DIAGNOSIS — J189 Pneumonia, unspecified organism: Secondary | ICD-10-CM | POA: Diagnosis not present

## 2020-12-14 DIAGNOSIS — J189 Pneumonia, unspecified organism: Secondary | ICD-10-CM | POA: Diagnosis not present

## 2020-12-14 DIAGNOSIS — E785 Hyperlipidemia, unspecified: Secondary | ICD-10-CM | POA: Diagnosis not present

## 2020-12-14 DIAGNOSIS — E039 Hypothyroidism, unspecified: Secondary | ICD-10-CM | POA: Diagnosis not present

## 2020-12-14 DIAGNOSIS — G309 Alzheimer's disease, unspecified: Secondary | ICD-10-CM | POA: Diagnosis not present

## 2020-12-14 DIAGNOSIS — M1990 Unspecified osteoarthritis, unspecified site: Secondary | ICD-10-CM | POA: Diagnosis not present

## 2020-12-14 DIAGNOSIS — N3946 Mixed incontinence: Secondary | ICD-10-CM | POA: Diagnosis not present

## 2020-12-15 DIAGNOSIS — E785 Hyperlipidemia, unspecified: Secondary | ICD-10-CM | POA: Diagnosis not present

## 2020-12-15 DIAGNOSIS — G309 Alzheimer's disease, unspecified: Secondary | ICD-10-CM | POA: Diagnosis not present

## 2020-12-15 DIAGNOSIS — J189 Pneumonia, unspecified organism: Secondary | ICD-10-CM | POA: Diagnosis not present

## 2020-12-15 DIAGNOSIS — M1990 Unspecified osteoarthritis, unspecified site: Secondary | ICD-10-CM | POA: Diagnosis not present

## 2020-12-15 DIAGNOSIS — N3946 Mixed incontinence: Secondary | ICD-10-CM | POA: Diagnosis not present

## 2020-12-15 DIAGNOSIS — E039 Hypothyroidism, unspecified: Secondary | ICD-10-CM | POA: Diagnosis not present

## 2020-12-16 DIAGNOSIS — N3946 Mixed incontinence: Secondary | ICD-10-CM | POA: Diagnosis not present

## 2020-12-16 DIAGNOSIS — M1990 Unspecified osteoarthritis, unspecified site: Secondary | ICD-10-CM | POA: Diagnosis not present

## 2020-12-16 DIAGNOSIS — E039 Hypothyroidism, unspecified: Secondary | ICD-10-CM | POA: Diagnosis not present

## 2020-12-16 DIAGNOSIS — G309 Alzheimer's disease, unspecified: Secondary | ICD-10-CM | POA: Diagnosis not present

## 2020-12-16 DIAGNOSIS — J189 Pneumonia, unspecified organism: Secondary | ICD-10-CM | POA: Diagnosis not present

## 2020-12-16 DIAGNOSIS — E785 Hyperlipidemia, unspecified: Secondary | ICD-10-CM | POA: Diagnosis not present

## 2020-12-19 DIAGNOSIS — M1990 Unspecified osteoarthritis, unspecified site: Secondary | ICD-10-CM | POA: Diagnosis not present

## 2020-12-19 DIAGNOSIS — G309 Alzheimer's disease, unspecified: Secondary | ICD-10-CM | POA: Diagnosis not present

## 2020-12-19 DIAGNOSIS — E039 Hypothyroidism, unspecified: Secondary | ICD-10-CM | POA: Diagnosis not present

## 2020-12-19 DIAGNOSIS — N3946 Mixed incontinence: Secondary | ICD-10-CM | POA: Diagnosis not present

## 2020-12-19 DIAGNOSIS — E785 Hyperlipidemia, unspecified: Secondary | ICD-10-CM | POA: Diagnosis not present

## 2020-12-19 DIAGNOSIS — J189 Pneumonia, unspecified organism: Secondary | ICD-10-CM | POA: Diagnosis not present

## 2020-12-20 DIAGNOSIS — G309 Alzheimer's disease, unspecified: Secondary | ICD-10-CM | POA: Diagnosis not present

## 2020-12-20 DIAGNOSIS — E039 Hypothyroidism, unspecified: Secondary | ICD-10-CM | POA: Diagnosis not present

## 2020-12-20 DIAGNOSIS — M1990 Unspecified osteoarthritis, unspecified site: Secondary | ICD-10-CM | POA: Diagnosis not present

## 2020-12-20 DIAGNOSIS — E785 Hyperlipidemia, unspecified: Secondary | ICD-10-CM | POA: Diagnosis not present

## 2020-12-20 DIAGNOSIS — N3946 Mixed incontinence: Secondary | ICD-10-CM | POA: Diagnosis not present

## 2020-12-20 DIAGNOSIS — J189 Pneumonia, unspecified organism: Secondary | ICD-10-CM | POA: Diagnosis not present

## 2020-12-21 DIAGNOSIS — J189 Pneumonia, unspecified organism: Secondary | ICD-10-CM | POA: Diagnosis not present

## 2020-12-21 DIAGNOSIS — N3946 Mixed incontinence: Secondary | ICD-10-CM | POA: Diagnosis not present

## 2020-12-21 DIAGNOSIS — E039 Hypothyroidism, unspecified: Secondary | ICD-10-CM | POA: Diagnosis not present

## 2020-12-21 DIAGNOSIS — E785 Hyperlipidemia, unspecified: Secondary | ICD-10-CM | POA: Diagnosis not present

## 2020-12-21 DIAGNOSIS — M1990 Unspecified osteoarthritis, unspecified site: Secondary | ICD-10-CM | POA: Diagnosis not present

## 2020-12-21 DIAGNOSIS — G309 Alzheimer's disease, unspecified: Secondary | ICD-10-CM | POA: Diagnosis not present

## 2020-12-22 DIAGNOSIS — J189 Pneumonia, unspecified organism: Secondary | ICD-10-CM | POA: Diagnosis not present

## 2020-12-22 DIAGNOSIS — M1990 Unspecified osteoarthritis, unspecified site: Secondary | ICD-10-CM | POA: Diagnosis not present

## 2020-12-22 DIAGNOSIS — E785 Hyperlipidemia, unspecified: Secondary | ICD-10-CM | POA: Diagnosis not present

## 2020-12-22 DIAGNOSIS — E039 Hypothyroidism, unspecified: Secondary | ICD-10-CM | POA: Diagnosis not present

## 2020-12-22 DIAGNOSIS — N3946 Mixed incontinence: Secondary | ICD-10-CM | POA: Diagnosis not present

## 2020-12-22 DIAGNOSIS — G309 Alzheimer's disease, unspecified: Secondary | ICD-10-CM | POA: Diagnosis not present

## 2020-12-23 DIAGNOSIS — M1990 Unspecified osteoarthritis, unspecified site: Secondary | ICD-10-CM | POA: Diagnosis not present

## 2020-12-23 DIAGNOSIS — J189 Pneumonia, unspecified organism: Secondary | ICD-10-CM | POA: Diagnosis not present

## 2020-12-23 DIAGNOSIS — G309 Alzheimer's disease, unspecified: Secondary | ICD-10-CM | POA: Diagnosis not present

## 2020-12-23 DIAGNOSIS — E785 Hyperlipidemia, unspecified: Secondary | ICD-10-CM | POA: Diagnosis not present

## 2020-12-23 DIAGNOSIS — N3946 Mixed incontinence: Secondary | ICD-10-CM | POA: Diagnosis not present

## 2020-12-23 DIAGNOSIS — E039 Hypothyroidism, unspecified: Secondary | ICD-10-CM | POA: Diagnosis not present

## 2020-12-26 DIAGNOSIS — J189 Pneumonia, unspecified organism: Secondary | ICD-10-CM | POA: Diagnosis not present

## 2020-12-26 DIAGNOSIS — E039 Hypothyroidism, unspecified: Secondary | ICD-10-CM | POA: Diagnosis not present

## 2020-12-26 DIAGNOSIS — M1990 Unspecified osteoarthritis, unspecified site: Secondary | ICD-10-CM | POA: Diagnosis not present

## 2020-12-26 DIAGNOSIS — N3946 Mixed incontinence: Secondary | ICD-10-CM | POA: Diagnosis not present

## 2020-12-26 DIAGNOSIS — E785 Hyperlipidemia, unspecified: Secondary | ICD-10-CM | POA: Diagnosis not present

## 2020-12-26 DIAGNOSIS — G309 Alzheimer's disease, unspecified: Secondary | ICD-10-CM | POA: Diagnosis not present

## 2020-12-27 DIAGNOSIS — E785 Hyperlipidemia, unspecified: Secondary | ICD-10-CM | POA: Diagnosis not present

## 2020-12-27 DIAGNOSIS — G309 Alzheimer's disease, unspecified: Secondary | ICD-10-CM | POA: Diagnosis not present

## 2020-12-27 DIAGNOSIS — R0602 Shortness of breath: Secondary | ICD-10-CM | POA: Diagnosis not present

## 2020-12-27 DIAGNOSIS — N3946 Mixed incontinence: Secondary | ICD-10-CM | POA: Diagnosis not present

## 2020-12-27 DIAGNOSIS — J189 Pneumonia, unspecified organism: Secondary | ICD-10-CM | POA: Diagnosis not present

## 2020-12-27 DIAGNOSIS — R531 Weakness: Secondary | ICD-10-CM | POA: Diagnosis not present

## 2020-12-27 DIAGNOSIS — M1990 Unspecified osteoarthritis, unspecified site: Secondary | ICD-10-CM | POA: Diagnosis not present

## 2020-12-27 DIAGNOSIS — E039 Hypothyroidism, unspecified: Secondary | ICD-10-CM | POA: Diagnosis not present

## 2020-12-28 DIAGNOSIS — J189 Pneumonia, unspecified organism: Secondary | ICD-10-CM | POA: Diagnosis not present

## 2020-12-28 DIAGNOSIS — E785 Hyperlipidemia, unspecified: Secondary | ICD-10-CM | POA: Diagnosis not present

## 2020-12-28 DIAGNOSIS — E039 Hypothyroidism, unspecified: Secondary | ICD-10-CM | POA: Diagnosis not present

## 2020-12-28 DIAGNOSIS — N3946 Mixed incontinence: Secondary | ICD-10-CM | POA: Diagnosis not present

## 2020-12-28 DIAGNOSIS — G309 Alzheimer's disease, unspecified: Secondary | ICD-10-CM | POA: Diagnosis not present

## 2020-12-28 DIAGNOSIS — M1990 Unspecified osteoarthritis, unspecified site: Secondary | ICD-10-CM | POA: Diagnosis not present

## 2020-12-29 DIAGNOSIS — N3946 Mixed incontinence: Secondary | ICD-10-CM | POA: Diagnosis not present

## 2020-12-29 DIAGNOSIS — E039 Hypothyroidism, unspecified: Secondary | ICD-10-CM | POA: Diagnosis not present

## 2020-12-29 DIAGNOSIS — J189 Pneumonia, unspecified organism: Secondary | ICD-10-CM | POA: Diagnosis not present

## 2020-12-29 DIAGNOSIS — G309 Alzheimer's disease, unspecified: Secondary | ICD-10-CM | POA: Diagnosis not present

## 2020-12-29 DIAGNOSIS — E785 Hyperlipidemia, unspecified: Secondary | ICD-10-CM | POA: Diagnosis not present

## 2020-12-29 DIAGNOSIS — M1990 Unspecified osteoarthritis, unspecified site: Secondary | ICD-10-CM | POA: Diagnosis not present

## 2020-12-30 DIAGNOSIS — N3946 Mixed incontinence: Secondary | ICD-10-CM | POA: Diagnosis not present

## 2020-12-30 DIAGNOSIS — J189 Pneumonia, unspecified organism: Secondary | ICD-10-CM | POA: Diagnosis not present

## 2020-12-30 DIAGNOSIS — G309 Alzheimer's disease, unspecified: Secondary | ICD-10-CM | POA: Diagnosis not present

## 2020-12-30 DIAGNOSIS — E785 Hyperlipidemia, unspecified: Secondary | ICD-10-CM | POA: Diagnosis not present

## 2020-12-30 DIAGNOSIS — M1990 Unspecified osteoarthritis, unspecified site: Secondary | ICD-10-CM | POA: Diagnosis not present

## 2020-12-30 DIAGNOSIS — E039 Hypothyroidism, unspecified: Secondary | ICD-10-CM | POA: Diagnosis not present

## 2021-01-02 DIAGNOSIS — E785 Hyperlipidemia, unspecified: Secondary | ICD-10-CM | POA: Diagnosis not present

## 2021-01-02 DIAGNOSIS — J189 Pneumonia, unspecified organism: Secondary | ICD-10-CM | POA: Diagnosis not present

## 2021-01-02 DIAGNOSIS — N3946 Mixed incontinence: Secondary | ICD-10-CM | POA: Diagnosis not present

## 2021-01-02 DIAGNOSIS — E039 Hypothyroidism, unspecified: Secondary | ICD-10-CM | POA: Diagnosis not present

## 2021-01-02 DIAGNOSIS — M1990 Unspecified osteoarthritis, unspecified site: Secondary | ICD-10-CM | POA: Diagnosis not present

## 2021-01-02 DIAGNOSIS — G309 Alzheimer's disease, unspecified: Secondary | ICD-10-CM | POA: Diagnosis not present

## 2021-01-03 DIAGNOSIS — E039 Hypothyroidism, unspecified: Secondary | ICD-10-CM | POA: Diagnosis not present

## 2021-01-03 DIAGNOSIS — N3946 Mixed incontinence: Secondary | ICD-10-CM | POA: Diagnosis not present

## 2021-01-03 DIAGNOSIS — J189 Pneumonia, unspecified organism: Secondary | ICD-10-CM | POA: Diagnosis not present

## 2021-01-03 DIAGNOSIS — M1990 Unspecified osteoarthritis, unspecified site: Secondary | ICD-10-CM | POA: Diagnosis not present

## 2021-01-03 DIAGNOSIS — E785 Hyperlipidemia, unspecified: Secondary | ICD-10-CM | POA: Diagnosis not present

## 2021-01-03 DIAGNOSIS — G309 Alzheimer's disease, unspecified: Secondary | ICD-10-CM | POA: Diagnosis not present

## 2021-01-04 DIAGNOSIS — E039 Hypothyroidism, unspecified: Secondary | ICD-10-CM | POA: Diagnosis not present

## 2021-01-04 DIAGNOSIS — G309 Alzheimer's disease, unspecified: Secondary | ICD-10-CM | POA: Diagnosis not present

## 2021-01-04 DIAGNOSIS — J189 Pneumonia, unspecified organism: Secondary | ICD-10-CM | POA: Diagnosis not present

## 2021-01-04 DIAGNOSIS — M1990 Unspecified osteoarthritis, unspecified site: Secondary | ICD-10-CM | POA: Diagnosis not present

## 2021-01-04 DIAGNOSIS — N3946 Mixed incontinence: Secondary | ICD-10-CM | POA: Diagnosis not present

## 2021-01-04 DIAGNOSIS — E785 Hyperlipidemia, unspecified: Secondary | ICD-10-CM | POA: Diagnosis not present

## 2021-01-05 DIAGNOSIS — E039 Hypothyroidism, unspecified: Secondary | ICD-10-CM | POA: Diagnosis not present

## 2021-01-05 DIAGNOSIS — G309 Alzheimer's disease, unspecified: Secondary | ICD-10-CM | POA: Diagnosis not present

## 2021-01-05 DIAGNOSIS — N3946 Mixed incontinence: Secondary | ICD-10-CM | POA: Diagnosis not present

## 2021-01-05 DIAGNOSIS — E785 Hyperlipidemia, unspecified: Secondary | ICD-10-CM | POA: Diagnosis not present

## 2021-01-05 DIAGNOSIS — M1990 Unspecified osteoarthritis, unspecified site: Secondary | ICD-10-CM | POA: Diagnosis not present

## 2021-01-05 DIAGNOSIS — J189 Pneumonia, unspecified organism: Secondary | ICD-10-CM | POA: Diagnosis not present

## 2021-01-06 DIAGNOSIS — J189 Pneumonia, unspecified organism: Secondary | ICD-10-CM | POA: Diagnosis not present

## 2021-01-06 DIAGNOSIS — N3946 Mixed incontinence: Secondary | ICD-10-CM | POA: Diagnosis not present

## 2021-01-06 DIAGNOSIS — E039 Hypothyroidism, unspecified: Secondary | ICD-10-CM | POA: Diagnosis not present

## 2021-01-06 DIAGNOSIS — G309 Alzheimer's disease, unspecified: Secondary | ICD-10-CM | POA: Diagnosis not present

## 2021-01-06 DIAGNOSIS — M1990 Unspecified osteoarthritis, unspecified site: Secondary | ICD-10-CM | POA: Diagnosis not present

## 2021-01-06 DIAGNOSIS — E785 Hyperlipidemia, unspecified: Secondary | ICD-10-CM | POA: Diagnosis not present

## 2021-01-09 DIAGNOSIS — G309 Alzheimer's disease, unspecified: Secondary | ICD-10-CM | POA: Diagnosis not present

## 2021-01-09 DIAGNOSIS — N3946 Mixed incontinence: Secondary | ICD-10-CM | POA: Diagnosis not present

## 2021-01-09 DIAGNOSIS — E039 Hypothyroidism, unspecified: Secondary | ICD-10-CM | POA: Diagnosis not present

## 2021-01-09 DIAGNOSIS — E785 Hyperlipidemia, unspecified: Secondary | ICD-10-CM | POA: Diagnosis not present

## 2021-01-09 DIAGNOSIS — M1990 Unspecified osteoarthritis, unspecified site: Secondary | ICD-10-CM | POA: Diagnosis not present

## 2021-01-09 DIAGNOSIS — J189 Pneumonia, unspecified organism: Secondary | ICD-10-CM | POA: Diagnosis not present

## 2021-01-10 DIAGNOSIS — G309 Alzheimer's disease, unspecified: Secondary | ICD-10-CM | POA: Diagnosis not present

## 2021-01-10 DIAGNOSIS — J189 Pneumonia, unspecified organism: Secondary | ICD-10-CM | POA: Diagnosis not present

## 2021-01-10 DIAGNOSIS — E039 Hypothyroidism, unspecified: Secondary | ICD-10-CM | POA: Diagnosis not present

## 2021-01-10 DIAGNOSIS — E785 Hyperlipidemia, unspecified: Secondary | ICD-10-CM | POA: Diagnosis not present

## 2021-01-10 DIAGNOSIS — N3946 Mixed incontinence: Secondary | ICD-10-CM | POA: Diagnosis not present

## 2021-01-10 DIAGNOSIS — M1990 Unspecified osteoarthritis, unspecified site: Secondary | ICD-10-CM | POA: Diagnosis not present

## 2021-01-12 DIAGNOSIS — M1990 Unspecified osteoarthritis, unspecified site: Secondary | ICD-10-CM | POA: Diagnosis not present

## 2021-01-12 DIAGNOSIS — J189 Pneumonia, unspecified organism: Secondary | ICD-10-CM | POA: Diagnosis not present

## 2021-01-12 DIAGNOSIS — N3946 Mixed incontinence: Secondary | ICD-10-CM | POA: Diagnosis not present

## 2021-01-12 DIAGNOSIS — E039 Hypothyroidism, unspecified: Secondary | ICD-10-CM | POA: Diagnosis not present

## 2021-01-12 DIAGNOSIS — G309 Alzheimer's disease, unspecified: Secondary | ICD-10-CM | POA: Diagnosis not present

## 2021-01-12 DIAGNOSIS — E785 Hyperlipidemia, unspecified: Secondary | ICD-10-CM | POA: Diagnosis not present

## 2021-01-16 DIAGNOSIS — G309 Alzheimer's disease, unspecified: Secondary | ICD-10-CM | POA: Diagnosis not present

## 2021-01-16 DIAGNOSIS — E039 Hypothyroidism, unspecified: Secondary | ICD-10-CM | POA: Diagnosis not present

## 2021-01-16 DIAGNOSIS — N3946 Mixed incontinence: Secondary | ICD-10-CM | POA: Diagnosis not present

## 2021-01-16 DIAGNOSIS — J189 Pneumonia, unspecified organism: Secondary | ICD-10-CM | POA: Diagnosis not present

## 2021-01-16 DIAGNOSIS — M1990 Unspecified osteoarthritis, unspecified site: Secondary | ICD-10-CM | POA: Diagnosis not present

## 2021-01-16 DIAGNOSIS — E785 Hyperlipidemia, unspecified: Secondary | ICD-10-CM | POA: Diagnosis not present

## 2021-01-17 DIAGNOSIS — E785 Hyperlipidemia, unspecified: Secondary | ICD-10-CM | POA: Diagnosis not present

## 2021-01-17 DIAGNOSIS — G309 Alzheimer's disease, unspecified: Secondary | ICD-10-CM | POA: Diagnosis not present

## 2021-01-17 DIAGNOSIS — J189 Pneumonia, unspecified organism: Secondary | ICD-10-CM | POA: Diagnosis not present

## 2021-01-17 DIAGNOSIS — N3946 Mixed incontinence: Secondary | ICD-10-CM | POA: Diagnosis not present

## 2021-01-17 DIAGNOSIS — M1990 Unspecified osteoarthritis, unspecified site: Secondary | ICD-10-CM | POA: Diagnosis not present

## 2021-01-17 DIAGNOSIS — E039 Hypothyroidism, unspecified: Secondary | ICD-10-CM | POA: Diagnosis not present

## 2021-01-18 DIAGNOSIS — M1990 Unspecified osteoarthritis, unspecified site: Secondary | ICD-10-CM | POA: Diagnosis not present

## 2021-01-18 DIAGNOSIS — J189 Pneumonia, unspecified organism: Secondary | ICD-10-CM | POA: Diagnosis not present

## 2021-01-18 DIAGNOSIS — E039 Hypothyroidism, unspecified: Secondary | ICD-10-CM | POA: Diagnosis not present

## 2021-01-18 DIAGNOSIS — E785 Hyperlipidemia, unspecified: Secondary | ICD-10-CM | POA: Diagnosis not present

## 2021-01-18 DIAGNOSIS — G309 Alzheimer's disease, unspecified: Secondary | ICD-10-CM | POA: Diagnosis not present

## 2021-01-18 DIAGNOSIS — N3946 Mixed incontinence: Secondary | ICD-10-CM | POA: Diagnosis not present

## 2021-01-19 DIAGNOSIS — E785 Hyperlipidemia, unspecified: Secondary | ICD-10-CM | POA: Diagnosis not present

## 2021-01-19 DIAGNOSIS — E039 Hypothyroidism, unspecified: Secondary | ICD-10-CM | POA: Diagnosis not present

## 2021-01-19 DIAGNOSIS — G309 Alzheimer's disease, unspecified: Secondary | ICD-10-CM | POA: Diagnosis not present

## 2021-01-19 DIAGNOSIS — M1990 Unspecified osteoarthritis, unspecified site: Secondary | ICD-10-CM | POA: Diagnosis not present

## 2021-01-19 DIAGNOSIS — N3946 Mixed incontinence: Secondary | ICD-10-CM | POA: Diagnosis not present

## 2021-01-19 DIAGNOSIS — J189 Pneumonia, unspecified organism: Secondary | ICD-10-CM | POA: Diagnosis not present

## 2021-01-20 DIAGNOSIS — G309 Alzheimer's disease, unspecified: Secondary | ICD-10-CM | POA: Diagnosis not present

## 2021-01-20 DIAGNOSIS — E785 Hyperlipidemia, unspecified: Secondary | ICD-10-CM | POA: Diagnosis not present

## 2021-01-20 DIAGNOSIS — E039 Hypothyroidism, unspecified: Secondary | ICD-10-CM | POA: Diagnosis not present

## 2021-01-20 DIAGNOSIS — J189 Pneumonia, unspecified organism: Secondary | ICD-10-CM | POA: Diagnosis not present

## 2021-01-20 DIAGNOSIS — N3946 Mixed incontinence: Secondary | ICD-10-CM | POA: Diagnosis not present

## 2021-01-20 DIAGNOSIS — M1990 Unspecified osteoarthritis, unspecified site: Secondary | ICD-10-CM | POA: Diagnosis not present

## 2021-01-23 DIAGNOSIS — N3946 Mixed incontinence: Secondary | ICD-10-CM | POA: Diagnosis not present

## 2021-01-23 DIAGNOSIS — E785 Hyperlipidemia, unspecified: Secondary | ICD-10-CM | POA: Diagnosis not present

## 2021-01-23 DIAGNOSIS — J189 Pneumonia, unspecified organism: Secondary | ICD-10-CM | POA: Diagnosis not present

## 2021-01-23 DIAGNOSIS — E039 Hypothyroidism, unspecified: Secondary | ICD-10-CM | POA: Diagnosis not present

## 2021-01-23 DIAGNOSIS — M1990 Unspecified osteoarthritis, unspecified site: Secondary | ICD-10-CM | POA: Diagnosis not present

## 2021-01-23 DIAGNOSIS — G309 Alzheimer's disease, unspecified: Secondary | ICD-10-CM | POA: Diagnosis not present

## 2021-01-24 DIAGNOSIS — J189 Pneumonia, unspecified organism: Secondary | ICD-10-CM | POA: Diagnosis not present

## 2021-01-24 DIAGNOSIS — E785 Hyperlipidemia, unspecified: Secondary | ICD-10-CM | POA: Diagnosis not present

## 2021-01-24 DIAGNOSIS — N3946 Mixed incontinence: Secondary | ICD-10-CM | POA: Diagnosis not present

## 2021-01-24 DIAGNOSIS — M1990 Unspecified osteoarthritis, unspecified site: Secondary | ICD-10-CM | POA: Diagnosis not present

## 2021-01-24 DIAGNOSIS — G309 Alzheimer's disease, unspecified: Secondary | ICD-10-CM | POA: Diagnosis not present

## 2021-01-24 DIAGNOSIS — E039 Hypothyroidism, unspecified: Secondary | ICD-10-CM | POA: Diagnosis not present

## 2021-01-25 DIAGNOSIS — E039 Hypothyroidism, unspecified: Secondary | ICD-10-CM | POA: Diagnosis not present

## 2021-01-25 DIAGNOSIS — J189 Pneumonia, unspecified organism: Secondary | ICD-10-CM | POA: Diagnosis not present

## 2021-01-25 DIAGNOSIS — N3946 Mixed incontinence: Secondary | ICD-10-CM | POA: Diagnosis not present

## 2021-01-25 DIAGNOSIS — M1990 Unspecified osteoarthritis, unspecified site: Secondary | ICD-10-CM | POA: Diagnosis not present

## 2021-01-25 DIAGNOSIS — E785 Hyperlipidemia, unspecified: Secondary | ICD-10-CM | POA: Diagnosis not present

## 2021-01-25 DIAGNOSIS — G309 Alzheimer's disease, unspecified: Secondary | ICD-10-CM | POA: Diagnosis not present

## 2021-01-26 DIAGNOSIS — E785 Hyperlipidemia, unspecified: Secondary | ICD-10-CM | POA: Diagnosis not present

## 2021-01-26 DIAGNOSIS — N3946 Mixed incontinence: Secondary | ICD-10-CM | POA: Diagnosis not present

## 2021-01-26 DIAGNOSIS — J189 Pneumonia, unspecified organism: Secondary | ICD-10-CM | POA: Diagnosis not present

## 2021-01-26 DIAGNOSIS — E039 Hypothyroidism, unspecified: Secondary | ICD-10-CM | POA: Diagnosis not present

## 2021-01-26 DIAGNOSIS — G309 Alzheimer's disease, unspecified: Secondary | ICD-10-CM | POA: Diagnosis not present

## 2021-01-26 DIAGNOSIS — M1990 Unspecified osteoarthritis, unspecified site: Secondary | ICD-10-CM | POA: Diagnosis not present

## 2021-01-27 DIAGNOSIS — N3946 Mixed incontinence: Secondary | ICD-10-CM | POA: Diagnosis not present

## 2021-01-27 DIAGNOSIS — R531 Weakness: Secondary | ICD-10-CM | POA: Diagnosis not present

## 2021-01-27 DIAGNOSIS — M1990 Unspecified osteoarthritis, unspecified site: Secondary | ICD-10-CM | POA: Diagnosis not present

## 2021-01-27 DIAGNOSIS — E785 Hyperlipidemia, unspecified: Secondary | ICD-10-CM | POA: Diagnosis not present

## 2021-01-27 DIAGNOSIS — J189 Pneumonia, unspecified organism: Secondary | ICD-10-CM | POA: Diagnosis not present

## 2021-01-27 DIAGNOSIS — R0602 Shortness of breath: Secondary | ICD-10-CM | POA: Diagnosis not present

## 2021-01-27 DIAGNOSIS — G309 Alzheimer's disease, unspecified: Secondary | ICD-10-CM | POA: Diagnosis not present

## 2021-01-27 DIAGNOSIS — E039 Hypothyroidism, unspecified: Secondary | ICD-10-CM | POA: Diagnosis not present

## 2021-01-30 DIAGNOSIS — E785 Hyperlipidemia, unspecified: Secondary | ICD-10-CM | POA: Diagnosis not present

## 2021-01-30 DIAGNOSIS — J189 Pneumonia, unspecified organism: Secondary | ICD-10-CM | POA: Diagnosis not present

## 2021-01-30 DIAGNOSIS — N3946 Mixed incontinence: Secondary | ICD-10-CM | POA: Diagnosis not present

## 2021-01-30 DIAGNOSIS — M1990 Unspecified osteoarthritis, unspecified site: Secondary | ICD-10-CM | POA: Diagnosis not present

## 2021-01-30 DIAGNOSIS — G309 Alzheimer's disease, unspecified: Secondary | ICD-10-CM | POA: Diagnosis not present

## 2021-01-30 DIAGNOSIS — E039 Hypothyroidism, unspecified: Secondary | ICD-10-CM | POA: Diagnosis not present

## 2021-01-31 DIAGNOSIS — E785 Hyperlipidemia, unspecified: Secondary | ICD-10-CM | POA: Diagnosis not present

## 2021-01-31 DIAGNOSIS — M1990 Unspecified osteoarthritis, unspecified site: Secondary | ICD-10-CM | POA: Diagnosis not present

## 2021-01-31 DIAGNOSIS — E039 Hypothyroidism, unspecified: Secondary | ICD-10-CM | POA: Diagnosis not present

## 2021-01-31 DIAGNOSIS — N3946 Mixed incontinence: Secondary | ICD-10-CM | POA: Diagnosis not present

## 2021-01-31 DIAGNOSIS — G309 Alzheimer's disease, unspecified: Secondary | ICD-10-CM | POA: Diagnosis not present

## 2021-01-31 DIAGNOSIS — J189 Pneumonia, unspecified organism: Secondary | ICD-10-CM | POA: Diagnosis not present

## 2021-02-01 DIAGNOSIS — E785 Hyperlipidemia, unspecified: Secondary | ICD-10-CM | POA: Diagnosis not present

## 2021-02-01 DIAGNOSIS — E039 Hypothyroidism, unspecified: Secondary | ICD-10-CM | POA: Diagnosis not present

## 2021-02-01 DIAGNOSIS — G309 Alzheimer's disease, unspecified: Secondary | ICD-10-CM | POA: Diagnosis not present

## 2021-02-01 DIAGNOSIS — N3946 Mixed incontinence: Secondary | ICD-10-CM | POA: Diagnosis not present

## 2021-02-01 DIAGNOSIS — J189 Pneumonia, unspecified organism: Secondary | ICD-10-CM | POA: Diagnosis not present

## 2021-02-01 DIAGNOSIS — M1990 Unspecified osteoarthritis, unspecified site: Secondary | ICD-10-CM | POA: Diagnosis not present

## 2021-02-02 DIAGNOSIS — J189 Pneumonia, unspecified organism: Secondary | ICD-10-CM | POA: Diagnosis not present

## 2021-02-02 DIAGNOSIS — N3946 Mixed incontinence: Secondary | ICD-10-CM | POA: Diagnosis not present

## 2021-02-02 DIAGNOSIS — E785 Hyperlipidemia, unspecified: Secondary | ICD-10-CM | POA: Diagnosis not present

## 2021-02-02 DIAGNOSIS — M1990 Unspecified osteoarthritis, unspecified site: Secondary | ICD-10-CM | POA: Diagnosis not present

## 2021-02-02 DIAGNOSIS — G309 Alzheimer's disease, unspecified: Secondary | ICD-10-CM | POA: Diagnosis not present

## 2021-02-02 DIAGNOSIS — E039 Hypothyroidism, unspecified: Secondary | ICD-10-CM | POA: Diagnosis not present

## 2021-02-03 DIAGNOSIS — E039 Hypothyroidism, unspecified: Secondary | ICD-10-CM | POA: Diagnosis not present

## 2021-02-03 DIAGNOSIS — G309 Alzheimer's disease, unspecified: Secondary | ICD-10-CM | POA: Diagnosis not present

## 2021-02-03 DIAGNOSIS — M1990 Unspecified osteoarthritis, unspecified site: Secondary | ICD-10-CM | POA: Diagnosis not present

## 2021-02-03 DIAGNOSIS — J189 Pneumonia, unspecified organism: Secondary | ICD-10-CM | POA: Diagnosis not present

## 2021-02-03 DIAGNOSIS — E785 Hyperlipidemia, unspecified: Secondary | ICD-10-CM | POA: Diagnosis not present

## 2021-02-03 DIAGNOSIS — N3946 Mixed incontinence: Secondary | ICD-10-CM | POA: Diagnosis not present

## 2021-02-06 DIAGNOSIS — E039 Hypothyroidism, unspecified: Secondary | ICD-10-CM | POA: Diagnosis not present

## 2021-02-06 DIAGNOSIS — G309 Alzheimer's disease, unspecified: Secondary | ICD-10-CM | POA: Diagnosis not present

## 2021-02-06 DIAGNOSIS — N3946 Mixed incontinence: Secondary | ICD-10-CM | POA: Diagnosis not present

## 2021-02-06 DIAGNOSIS — E785 Hyperlipidemia, unspecified: Secondary | ICD-10-CM | POA: Diagnosis not present

## 2021-02-06 DIAGNOSIS — M1990 Unspecified osteoarthritis, unspecified site: Secondary | ICD-10-CM | POA: Diagnosis not present

## 2021-02-06 DIAGNOSIS — J189 Pneumonia, unspecified organism: Secondary | ICD-10-CM | POA: Diagnosis not present

## 2021-02-07 DIAGNOSIS — M1990 Unspecified osteoarthritis, unspecified site: Secondary | ICD-10-CM | POA: Diagnosis not present

## 2021-02-07 DIAGNOSIS — E785 Hyperlipidemia, unspecified: Secondary | ICD-10-CM | POA: Diagnosis not present

## 2021-02-07 DIAGNOSIS — J189 Pneumonia, unspecified organism: Secondary | ICD-10-CM | POA: Diagnosis not present

## 2021-02-07 DIAGNOSIS — E039 Hypothyroidism, unspecified: Secondary | ICD-10-CM | POA: Diagnosis not present

## 2021-02-07 DIAGNOSIS — G309 Alzheimer's disease, unspecified: Secondary | ICD-10-CM | POA: Diagnosis not present

## 2021-02-07 DIAGNOSIS — N3946 Mixed incontinence: Secondary | ICD-10-CM | POA: Diagnosis not present

## 2021-02-08 DIAGNOSIS — E785 Hyperlipidemia, unspecified: Secondary | ICD-10-CM | POA: Diagnosis not present

## 2021-02-08 DIAGNOSIS — N3946 Mixed incontinence: Secondary | ICD-10-CM | POA: Diagnosis not present

## 2021-02-08 DIAGNOSIS — M1990 Unspecified osteoarthritis, unspecified site: Secondary | ICD-10-CM | POA: Diagnosis not present

## 2021-02-08 DIAGNOSIS — J189 Pneumonia, unspecified organism: Secondary | ICD-10-CM | POA: Diagnosis not present

## 2021-02-08 DIAGNOSIS — E039 Hypothyroidism, unspecified: Secondary | ICD-10-CM | POA: Diagnosis not present

## 2021-02-08 DIAGNOSIS — G309 Alzheimer's disease, unspecified: Secondary | ICD-10-CM | POA: Diagnosis not present

## 2021-02-10 DIAGNOSIS — M1990 Unspecified osteoarthritis, unspecified site: Secondary | ICD-10-CM | POA: Diagnosis not present

## 2021-02-10 DIAGNOSIS — N3946 Mixed incontinence: Secondary | ICD-10-CM | POA: Diagnosis not present

## 2021-02-10 DIAGNOSIS — E039 Hypothyroidism, unspecified: Secondary | ICD-10-CM | POA: Diagnosis not present

## 2021-02-10 DIAGNOSIS — E785 Hyperlipidemia, unspecified: Secondary | ICD-10-CM | POA: Diagnosis not present

## 2021-02-10 DIAGNOSIS — J189 Pneumonia, unspecified organism: Secondary | ICD-10-CM | POA: Diagnosis not present

## 2021-02-10 DIAGNOSIS — G309 Alzheimer's disease, unspecified: Secondary | ICD-10-CM | POA: Diagnosis not present

## 2021-02-13 DIAGNOSIS — J189 Pneumonia, unspecified organism: Secondary | ICD-10-CM | POA: Diagnosis not present

## 2021-02-13 DIAGNOSIS — G309 Alzheimer's disease, unspecified: Secondary | ICD-10-CM | POA: Diagnosis not present

## 2021-02-13 DIAGNOSIS — N3946 Mixed incontinence: Secondary | ICD-10-CM | POA: Diagnosis not present

## 2021-02-13 DIAGNOSIS — E039 Hypothyroidism, unspecified: Secondary | ICD-10-CM | POA: Diagnosis not present

## 2021-02-13 DIAGNOSIS — E785 Hyperlipidemia, unspecified: Secondary | ICD-10-CM | POA: Diagnosis not present

## 2021-02-13 DIAGNOSIS — M1990 Unspecified osteoarthritis, unspecified site: Secondary | ICD-10-CM | POA: Diagnosis not present

## 2021-02-14 DIAGNOSIS — E039 Hypothyroidism, unspecified: Secondary | ICD-10-CM | POA: Diagnosis not present

## 2021-02-14 DIAGNOSIS — M1990 Unspecified osteoarthritis, unspecified site: Secondary | ICD-10-CM | POA: Diagnosis not present

## 2021-02-14 DIAGNOSIS — J189 Pneumonia, unspecified organism: Secondary | ICD-10-CM | POA: Diagnosis not present

## 2021-02-14 DIAGNOSIS — G309 Alzheimer's disease, unspecified: Secondary | ICD-10-CM | POA: Diagnosis not present

## 2021-02-14 DIAGNOSIS — N3946 Mixed incontinence: Secondary | ICD-10-CM | POA: Diagnosis not present

## 2021-02-14 DIAGNOSIS — E785 Hyperlipidemia, unspecified: Secondary | ICD-10-CM | POA: Diagnosis not present

## 2021-02-15 DIAGNOSIS — G309 Alzheimer's disease, unspecified: Secondary | ICD-10-CM | POA: Diagnosis not present

## 2021-02-15 DIAGNOSIS — E785 Hyperlipidemia, unspecified: Secondary | ICD-10-CM | POA: Diagnosis not present

## 2021-02-15 DIAGNOSIS — E039 Hypothyroidism, unspecified: Secondary | ICD-10-CM | POA: Diagnosis not present

## 2021-02-15 DIAGNOSIS — N3946 Mixed incontinence: Secondary | ICD-10-CM | POA: Diagnosis not present

## 2021-02-15 DIAGNOSIS — J189 Pneumonia, unspecified organism: Secondary | ICD-10-CM | POA: Diagnosis not present

## 2021-02-15 DIAGNOSIS — M1990 Unspecified osteoarthritis, unspecified site: Secondary | ICD-10-CM | POA: Diagnosis not present

## 2021-02-16 DIAGNOSIS — N3946 Mixed incontinence: Secondary | ICD-10-CM | POA: Diagnosis not present

## 2021-02-16 DIAGNOSIS — G309 Alzheimer's disease, unspecified: Secondary | ICD-10-CM | POA: Diagnosis not present

## 2021-02-16 DIAGNOSIS — J189 Pneumonia, unspecified organism: Secondary | ICD-10-CM | POA: Diagnosis not present

## 2021-02-16 DIAGNOSIS — E039 Hypothyroidism, unspecified: Secondary | ICD-10-CM | POA: Diagnosis not present

## 2021-02-16 DIAGNOSIS — M1990 Unspecified osteoarthritis, unspecified site: Secondary | ICD-10-CM | POA: Diagnosis not present

## 2021-02-16 DIAGNOSIS — E785 Hyperlipidemia, unspecified: Secondary | ICD-10-CM | POA: Diagnosis not present

## 2021-02-17 DIAGNOSIS — N3946 Mixed incontinence: Secondary | ICD-10-CM | POA: Diagnosis not present

## 2021-02-17 DIAGNOSIS — E039 Hypothyroidism, unspecified: Secondary | ICD-10-CM | POA: Diagnosis not present

## 2021-02-17 DIAGNOSIS — G309 Alzheimer's disease, unspecified: Secondary | ICD-10-CM | POA: Diagnosis not present

## 2021-02-17 DIAGNOSIS — J189 Pneumonia, unspecified organism: Secondary | ICD-10-CM | POA: Diagnosis not present

## 2021-02-17 DIAGNOSIS — E785 Hyperlipidemia, unspecified: Secondary | ICD-10-CM | POA: Diagnosis not present

## 2021-02-17 DIAGNOSIS — M1990 Unspecified osteoarthritis, unspecified site: Secondary | ICD-10-CM | POA: Diagnosis not present

## 2021-02-20 DIAGNOSIS — E039 Hypothyroidism, unspecified: Secondary | ICD-10-CM | POA: Diagnosis not present

## 2021-02-20 DIAGNOSIS — E785 Hyperlipidemia, unspecified: Secondary | ICD-10-CM | POA: Diagnosis not present

## 2021-02-20 DIAGNOSIS — J189 Pneumonia, unspecified organism: Secondary | ICD-10-CM | POA: Diagnosis not present

## 2021-02-20 DIAGNOSIS — M1990 Unspecified osteoarthritis, unspecified site: Secondary | ICD-10-CM | POA: Diagnosis not present

## 2021-02-20 DIAGNOSIS — N3946 Mixed incontinence: Secondary | ICD-10-CM | POA: Diagnosis not present

## 2021-02-20 DIAGNOSIS — G309 Alzheimer's disease, unspecified: Secondary | ICD-10-CM | POA: Diagnosis not present

## 2021-02-21 DIAGNOSIS — M1990 Unspecified osteoarthritis, unspecified site: Secondary | ICD-10-CM | POA: Diagnosis not present

## 2021-02-21 DIAGNOSIS — J189 Pneumonia, unspecified organism: Secondary | ICD-10-CM | POA: Diagnosis not present

## 2021-02-21 DIAGNOSIS — G309 Alzheimer's disease, unspecified: Secondary | ICD-10-CM | POA: Diagnosis not present

## 2021-02-21 DIAGNOSIS — N3946 Mixed incontinence: Secondary | ICD-10-CM | POA: Diagnosis not present

## 2021-02-21 DIAGNOSIS — E785 Hyperlipidemia, unspecified: Secondary | ICD-10-CM | POA: Diagnosis not present

## 2021-02-21 DIAGNOSIS — E039 Hypothyroidism, unspecified: Secondary | ICD-10-CM | POA: Diagnosis not present

## 2021-02-22 DIAGNOSIS — M1990 Unspecified osteoarthritis, unspecified site: Secondary | ICD-10-CM | POA: Diagnosis not present

## 2021-02-22 DIAGNOSIS — N3946 Mixed incontinence: Secondary | ICD-10-CM | POA: Diagnosis not present

## 2021-02-22 DIAGNOSIS — E785 Hyperlipidemia, unspecified: Secondary | ICD-10-CM | POA: Diagnosis not present

## 2021-02-22 DIAGNOSIS — G309 Alzheimer's disease, unspecified: Secondary | ICD-10-CM | POA: Diagnosis not present

## 2021-02-22 DIAGNOSIS — J189 Pneumonia, unspecified organism: Secondary | ICD-10-CM | POA: Diagnosis not present

## 2021-02-22 DIAGNOSIS — E039 Hypothyroidism, unspecified: Secondary | ICD-10-CM | POA: Diagnosis not present

## 2021-02-23 DIAGNOSIS — N3946 Mixed incontinence: Secondary | ICD-10-CM | POA: Diagnosis not present

## 2021-02-23 DIAGNOSIS — G309 Alzheimer's disease, unspecified: Secondary | ICD-10-CM | POA: Diagnosis not present

## 2021-02-23 DIAGNOSIS — M1990 Unspecified osteoarthritis, unspecified site: Secondary | ICD-10-CM | POA: Diagnosis not present

## 2021-02-23 DIAGNOSIS — E785 Hyperlipidemia, unspecified: Secondary | ICD-10-CM | POA: Diagnosis not present

## 2021-02-23 DIAGNOSIS — E039 Hypothyroidism, unspecified: Secondary | ICD-10-CM | POA: Diagnosis not present

## 2021-02-23 DIAGNOSIS — J189 Pneumonia, unspecified organism: Secondary | ICD-10-CM | POA: Diagnosis not present

## 2021-02-24 DIAGNOSIS — N3946 Mixed incontinence: Secondary | ICD-10-CM | POA: Diagnosis not present

## 2021-02-24 DIAGNOSIS — E039 Hypothyroidism, unspecified: Secondary | ICD-10-CM | POA: Diagnosis not present

## 2021-02-24 DIAGNOSIS — M1990 Unspecified osteoarthritis, unspecified site: Secondary | ICD-10-CM | POA: Diagnosis not present

## 2021-02-24 DIAGNOSIS — E785 Hyperlipidemia, unspecified: Secondary | ICD-10-CM | POA: Diagnosis not present

## 2021-02-24 DIAGNOSIS — J189 Pneumonia, unspecified organism: Secondary | ICD-10-CM | POA: Diagnosis not present

## 2021-02-24 DIAGNOSIS — G309 Alzheimer's disease, unspecified: Secondary | ICD-10-CM | POA: Diagnosis not present

## 2021-02-26 DIAGNOSIS — J189 Pneumonia, unspecified organism: Secondary | ICD-10-CM | POA: Diagnosis not present

## 2021-02-26 DIAGNOSIS — E785 Hyperlipidemia, unspecified: Secondary | ICD-10-CM | POA: Diagnosis not present

## 2021-02-26 DIAGNOSIS — N3946 Mixed incontinence: Secondary | ICD-10-CM | POA: Diagnosis not present

## 2021-02-26 DIAGNOSIS — G309 Alzheimer's disease, unspecified: Secondary | ICD-10-CM | POA: Diagnosis not present

## 2021-02-26 DIAGNOSIS — R0602 Shortness of breath: Secondary | ICD-10-CM | POA: Diagnosis not present

## 2021-02-26 DIAGNOSIS — E039 Hypothyroidism, unspecified: Secondary | ICD-10-CM | POA: Diagnosis not present

## 2021-02-26 DIAGNOSIS — R531 Weakness: Secondary | ICD-10-CM | POA: Diagnosis not present

## 2021-02-26 DIAGNOSIS — M1990 Unspecified osteoarthritis, unspecified site: Secondary | ICD-10-CM | POA: Diagnosis not present

## 2021-02-27 DIAGNOSIS — N3946 Mixed incontinence: Secondary | ICD-10-CM | POA: Diagnosis not present

## 2021-02-27 DIAGNOSIS — G309 Alzheimer's disease, unspecified: Secondary | ICD-10-CM | POA: Diagnosis not present

## 2021-02-27 DIAGNOSIS — E785 Hyperlipidemia, unspecified: Secondary | ICD-10-CM | POA: Diagnosis not present

## 2021-02-27 DIAGNOSIS — M1990 Unspecified osteoarthritis, unspecified site: Secondary | ICD-10-CM | POA: Diagnosis not present

## 2021-02-27 DIAGNOSIS — E039 Hypothyroidism, unspecified: Secondary | ICD-10-CM | POA: Diagnosis not present

## 2021-02-27 DIAGNOSIS — J189 Pneumonia, unspecified organism: Secondary | ICD-10-CM | POA: Diagnosis not present

## 2021-02-28 DIAGNOSIS — M1990 Unspecified osteoarthritis, unspecified site: Secondary | ICD-10-CM | POA: Diagnosis not present

## 2021-02-28 DIAGNOSIS — E039 Hypothyroidism, unspecified: Secondary | ICD-10-CM | POA: Diagnosis not present

## 2021-02-28 DIAGNOSIS — N3946 Mixed incontinence: Secondary | ICD-10-CM | POA: Diagnosis not present

## 2021-02-28 DIAGNOSIS — E785 Hyperlipidemia, unspecified: Secondary | ICD-10-CM | POA: Diagnosis not present

## 2021-02-28 DIAGNOSIS — J189 Pneumonia, unspecified organism: Secondary | ICD-10-CM | POA: Diagnosis not present

## 2021-02-28 DIAGNOSIS — G309 Alzheimer's disease, unspecified: Secondary | ICD-10-CM | POA: Diagnosis not present

## 2021-03-01 DIAGNOSIS — E785 Hyperlipidemia, unspecified: Secondary | ICD-10-CM | POA: Diagnosis not present

## 2021-03-01 DIAGNOSIS — G309 Alzheimer's disease, unspecified: Secondary | ICD-10-CM | POA: Diagnosis not present

## 2021-03-01 DIAGNOSIS — E039 Hypothyroidism, unspecified: Secondary | ICD-10-CM | POA: Diagnosis not present

## 2021-03-01 DIAGNOSIS — N3946 Mixed incontinence: Secondary | ICD-10-CM | POA: Diagnosis not present

## 2021-03-01 DIAGNOSIS — M1990 Unspecified osteoarthritis, unspecified site: Secondary | ICD-10-CM | POA: Diagnosis not present

## 2021-03-01 DIAGNOSIS — J189 Pneumonia, unspecified organism: Secondary | ICD-10-CM | POA: Diagnosis not present

## 2021-03-02 DIAGNOSIS — M1990 Unspecified osteoarthritis, unspecified site: Secondary | ICD-10-CM | POA: Diagnosis not present

## 2021-03-02 DIAGNOSIS — N3946 Mixed incontinence: Secondary | ICD-10-CM | POA: Diagnosis not present

## 2021-03-02 DIAGNOSIS — J189 Pneumonia, unspecified organism: Secondary | ICD-10-CM | POA: Diagnosis not present

## 2021-03-02 DIAGNOSIS — E785 Hyperlipidemia, unspecified: Secondary | ICD-10-CM | POA: Diagnosis not present

## 2021-03-02 DIAGNOSIS — E039 Hypothyroidism, unspecified: Secondary | ICD-10-CM | POA: Diagnosis not present

## 2021-03-02 DIAGNOSIS — G309 Alzheimer's disease, unspecified: Secondary | ICD-10-CM | POA: Diagnosis not present

## 2021-03-03 DIAGNOSIS — G309 Alzheimer's disease, unspecified: Secondary | ICD-10-CM | POA: Diagnosis not present

## 2021-03-03 DIAGNOSIS — J189 Pneumonia, unspecified organism: Secondary | ICD-10-CM | POA: Diagnosis not present

## 2021-03-03 DIAGNOSIS — M1990 Unspecified osteoarthritis, unspecified site: Secondary | ICD-10-CM | POA: Diagnosis not present

## 2021-03-03 DIAGNOSIS — E785 Hyperlipidemia, unspecified: Secondary | ICD-10-CM | POA: Diagnosis not present

## 2021-03-03 DIAGNOSIS — N3946 Mixed incontinence: Secondary | ICD-10-CM | POA: Diagnosis not present

## 2021-03-03 DIAGNOSIS — E039 Hypothyroidism, unspecified: Secondary | ICD-10-CM | POA: Diagnosis not present

## 2021-03-06 DIAGNOSIS — E785 Hyperlipidemia, unspecified: Secondary | ICD-10-CM | POA: Diagnosis not present

## 2021-03-06 DIAGNOSIS — N3946 Mixed incontinence: Secondary | ICD-10-CM | POA: Diagnosis not present

## 2021-03-06 DIAGNOSIS — M1990 Unspecified osteoarthritis, unspecified site: Secondary | ICD-10-CM | POA: Diagnosis not present

## 2021-03-06 DIAGNOSIS — J189 Pneumonia, unspecified organism: Secondary | ICD-10-CM | POA: Diagnosis not present

## 2021-03-06 DIAGNOSIS — E039 Hypothyroidism, unspecified: Secondary | ICD-10-CM | POA: Diagnosis not present

## 2021-03-06 DIAGNOSIS — G309 Alzheimer's disease, unspecified: Secondary | ICD-10-CM | POA: Diagnosis not present

## 2021-03-07 DIAGNOSIS — J189 Pneumonia, unspecified organism: Secondary | ICD-10-CM | POA: Diagnosis not present

## 2021-03-07 DIAGNOSIS — E785 Hyperlipidemia, unspecified: Secondary | ICD-10-CM | POA: Diagnosis not present

## 2021-03-07 DIAGNOSIS — E039 Hypothyroidism, unspecified: Secondary | ICD-10-CM | POA: Diagnosis not present

## 2021-03-07 DIAGNOSIS — N3946 Mixed incontinence: Secondary | ICD-10-CM | POA: Diagnosis not present

## 2021-03-07 DIAGNOSIS — M1990 Unspecified osteoarthritis, unspecified site: Secondary | ICD-10-CM | POA: Diagnosis not present

## 2021-03-07 DIAGNOSIS — G309 Alzheimer's disease, unspecified: Secondary | ICD-10-CM | POA: Diagnosis not present

## 2021-03-08 DIAGNOSIS — J189 Pneumonia, unspecified organism: Secondary | ICD-10-CM | POA: Diagnosis not present

## 2021-03-08 DIAGNOSIS — E039 Hypothyroidism, unspecified: Secondary | ICD-10-CM | POA: Diagnosis not present

## 2021-03-08 DIAGNOSIS — N3946 Mixed incontinence: Secondary | ICD-10-CM | POA: Diagnosis not present

## 2021-03-08 DIAGNOSIS — G309 Alzheimer's disease, unspecified: Secondary | ICD-10-CM | POA: Diagnosis not present

## 2021-03-08 DIAGNOSIS — M1990 Unspecified osteoarthritis, unspecified site: Secondary | ICD-10-CM | POA: Diagnosis not present

## 2021-03-08 DIAGNOSIS — E785 Hyperlipidemia, unspecified: Secondary | ICD-10-CM | POA: Diagnosis not present

## 2021-03-09 DIAGNOSIS — J189 Pneumonia, unspecified organism: Secondary | ICD-10-CM | POA: Diagnosis not present

## 2021-03-09 DIAGNOSIS — G309 Alzheimer's disease, unspecified: Secondary | ICD-10-CM | POA: Diagnosis not present

## 2021-03-09 DIAGNOSIS — E785 Hyperlipidemia, unspecified: Secondary | ICD-10-CM | POA: Diagnosis not present

## 2021-03-09 DIAGNOSIS — E039 Hypothyroidism, unspecified: Secondary | ICD-10-CM | POA: Diagnosis not present

## 2021-03-09 DIAGNOSIS — N3946 Mixed incontinence: Secondary | ICD-10-CM | POA: Diagnosis not present

## 2021-03-09 DIAGNOSIS — M1990 Unspecified osteoarthritis, unspecified site: Secondary | ICD-10-CM | POA: Diagnosis not present

## 2021-03-10 DIAGNOSIS — E785 Hyperlipidemia, unspecified: Secondary | ICD-10-CM | POA: Diagnosis not present

## 2021-03-10 DIAGNOSIS — E039 Hypothyroidism, unspecified: Secondary | ICD-10-CM | POA: Diagnosis not present

## 2021-03-10 DIAGNOSIS — J189 Pneumonia, unspecified organism: Secondary | ICD-10-CM | POA: Diagnosis not present

## 2021-03-10 DIAGNOSIS — N3946 Mixed incontinence: Secondary | ICD-10-CM | POA: Diagnosis not present

## 2021-03-10 DIAGNOSIS — M1990 Unspecified osteoarthritis, unspecified site: Secondary | ICD-10-CM | POA: Diagnosis not present

## 2021-03-10 DIAGNOSIS — G309 Alzheimer's disease, unspecified: Secondary | ICD-10-CM | POA: Diagnosis not present

## 2021-03-13 DIAGNOSIS — E039 Hypothyroidism, unspecified: Secondary | ICD-10-CM | POA: Diagnosis not present

## 2021-03-13 DIAGNOSIS — G309 Alzheimer's disease, unspecified: Secondary | ICD-10-CM | POA: Diagnosis not present

## 2021-03-13 DIAGNOSIS — J189 Pneumonia, unspecified organism: Secondary | ICD-10-CM | POA: Diagnosis not present

## 2021-03-13 DIAGNOSIS — M1990 Unspecified osteoarthritis, unspecified site: Secondary | ICD-10-CM | POA: Diagnosis not present

## 2021-03-13 DIAGNOSIS — N3946 Mixed incontinence: Secondary | ICD-10-CM | POA: Diagnosis not present

## 2021-03-13 DIAGNOSIS — E785 Hyperlipidemia, unspecified: Secondary | ICD-10-CM | POA: Diagnosis not present

## 2021-03-14 DIAGNOSIS — G309 Alzheimer's disease, unspecified: Secondary | ICD-10-CM | POA: Diagnosis not present

## 2021-03-14 DIAGNOSIS — E785 Hyperlipidemia, unspecified: Secondary | ICD-10-CM | POA: Diagnosis not present

## 2021-03-14 DIAGNOSIS — J189 Pneumonia, unspecified organism: Secondary | ICD-10-CM | POA: Diagnosis not present

## 2021-03-14 DIAGNOSIS — N3946 Mixed incontinence: Secondary | ICD-10-CM | POA: Diagnosis not present

## 2021-03-14 DIAGNOSIS — M1990 Unspecified osteoarthritis, unspecified site: Secondary | ICD-10-CM | POA: Diagnosis not present

## 2021-03-14 DIAGNOSIS — E039 Hypothyroidism, unspecified: Secondary | ICD-10-CM | POA: Diagnosis not present

## 2021-03-15 DIAGNOSIS — E785 Hyperlipidemia, unspecified: Secondary | ICD-10-CM | POA: Diagnosis not present

## 2021-03-15 DIAGNOSIS — E039 Hypothyroidism, unspecified: Secondary | ICD-10-CM | POA: Diagnosis not present

## 2021-03-15 DIAGNOSIS — M1990 Unspecified osteoarthritis, unspecified site: Secondary | ICD-10-CM | POA: Diagnosis not present

## 2021-03-15 DIAGNOSIS — G309 Alzheimer's disease, unspecified: Secondary | ICD-10-CM | POA: Diagnosis not present

## 2021-03-15 DIAGNOSIS — N3946 Mixed incontinence: Secondary | ICD-10-CM | POA: Diagnosis not present

## 2021-03-15 DIAGNOSIS — J189 Pneumonia, unspecified organism: Secondary | ICD-10-CM | POA: Diagnosis not present

## 2021-03-16 DIAGNOSIS — G309 Alzheimer's disease, unspecified: Secondary | ICD-10-CM | POA: Diagnosis not present

## 2021-03-16 DIAGNOSIS — J189 Pneumonia, unspecified organism: Secondary | ICD-10-CM | POA: Diagnosis not present

## 2021-03-16 DIAGNOSIS — E039 Hypothyroidism, unspecified: Secondary | ICD-10-CM | POA: Diagnosis not present

## 2021-03-16 DIAGNOSIS — E785 Hyperlipidemia, unspecified: Secondary | ICD-10-CM | POA: Diagnosis not present

## 2021-03-16 DIAGNOSIS — M1990 Unspecified osteoarthritis, unspecified site: Secondary | ICD-10-CM | POA: Diagnosis not present

## 2021-03-16 DIAGNOSIS — N3946 Mixed incontinence: Secondary | ICD-10-CM | POA: Diagnosis not present

## 2021-03-17 DIAGNOSIS — M1990 Unspecified osteoarthritis, unspecified site: Secondary | ICD-10-CM | POA: Diagnosis not present

## 2021-03-17 DIAGNOSIS — N3946 Mixed incontinence: Secondary | ICD-10-CM | POA: Diagnosis not present

## 2021-03-17 DIAGNOSIS — J189 Pneumonia, unspecified organism: Secondary | ICD-10-CM | POA: Diagnosis not present

## 2021-03-17 DIAGNOSIS — E039 Hypothyroidism, unspecified: Secondary | ICD-10-CM | POA: Diagnosis not present

## 2021-03-17 DIAGNOSIS — G309 Alzheimer's disease, unspecified: Secondary | ICD-10-CM | POA: Diagnosis not present

## 2021-03-17 DIAGNOSIS — E785 Hyperlipidemia, unspecified: Secondary | ICD-10-CM | POA: Diagnosis not present

## 2021-03-20 DIAGNOSIS — G309 Alzheimer's disease, unspecified: Secondary | ICD-10-CM | POA: Diagnosis not present

## 2021-03-20 DIAGNOSIS — E039 Hypothyroidism, unspecified: Secondary | ICD-10-CM | POA: Diagnosis not present

## 2021-03-20 DIAGNOSIS — E785 Hyperlipidemia, unspecified: Secondary | ICD-10-CM | POA: Diagnosis not present

## 2021-03-20 DIAGNOSIS — M1990 Unspecified osteoarthritis, unspecified site: Secondary | ICD-10-CM | POA: Diagnosis not present

## 2021-03-20 DIAGNOSIS — J189 Pneumonia, unspecified organism: Secondary | ICD-10-CM | POA: Diagnosis not present

## 2021-03-20 DIAGNOSIS — N3946 Mixed incontinence: Secondary | ICD-10-CM | POA: Diagnosis not present

## 2021-03-21 DIAGNOSIS — E785 Hyperlipidemia, unspecified: Secondary | ICD-10-CM | POA: Diagnosis not present

## 2021-03-21 DIAGNOSIS — N3946 Mixed incontinence: Secondary | ICD-10-CM | POA: Diagnosis not present

## 2021-03-21 DIAGNOSIS — M1990 Unspecified osteoarthritis, unspecified site: Secondary | ICD-10-CM | POA: Diagnosis not present

## 2021-03-21 DIAGNOSIS — J189 Pneumonia, unspecified organism: Secondary | ICD-10-CM | POA: Diagnosis not present

## 2021-03-21 DIAGNOSIS — G309 Alzheimer's disease, unspecified: Secondary | ICD-10-CM | POA: Diagnosis not present

## 2021-03-21 DIAGNOSIS — E039 Hypothyroidism, unspecified: Secondary | ICD-10-CM | POA: Diagnosis not present

## 2021-03-22 DIAGNOSIS — E039 Hypothyroidism, unspecified: Secondary | ICD-10-CM | POA: Diagnosis not present

## 2021-03-22 DIAGNOSIS — J189 Pneumonia, unspecified organism: Secondary | ICD-10-CM | POA: Diagnosis not present

## 2021-03-22 DIAGNOSIS — E785 Hyperlipidemia, unspecified: Secondary | ICD-10-CM | POA: Diagnosis not present

## 2021-03-22 DIAGNOSIS — N3946 Mixed incontinence: Secondary | ICD-10-CM | POA: Diagnosis not present

## 2021-03-22 DIAGNOSIS — G309 Alzheimer's disease, unspecified: Secondary | ICD-10-CM | POA: Diagnosis not present

## 2021-03-22 DIAGNOSIS — M1990 Unspecified osteoarthritis, unspecified site: Secondary | ICD-10-CM | POA: Diagnosis not present

## 2021-03-23 DIAGNOSIS — E785 Hyperlipidemia, unspecified: Secondary | ICD-10-CM | POA: Diagnosis not present

## 2021-03-23 DIAGNOSIS — E039 Hypothyroidism, unspecified: Secondary | ICD-10-CM | POA: Diagnosis not present

## 2021-03-23 DIAGNOSIS — M1990 Unspecified osteoarthritis, unspecified site: Secondary | ICD-10-CM | POA: Diagnosis not present

## 2021-03-23 DIAGNOSIS — J189 Pneumonia, unspecified organism: Secondary | ICD-10-CM | POA: Diagnosis not present

## 2021-03-23 DIAGNOSIS — G309 Alzheimer's disease, unspecified: Secondary | ICD-10-CM | POA: Diagnosis not present

## 2021-03-23 DIAGNOSIS — N3946 Mixed incontinence: Secondary | ICD-10-CM | POA: Diagnosis not present

## 2021-03-24 DIAGNOSIS — E785 Hyperlipidemia, unspecified: Secondary | ICD-10-CM | POA: Diagnosis not present

## 2021-03-24 DIAGNOSIS — N3946 Mixed incontinence: Secondary | ICD-10-CM | POA: Diagnosis not present

## 2021-03-24 DIAGNOSIS — G309 Alzheimer's disease, unspecified: Secondary | ICD-10-CM | POA: Diagnosis not present

## 2021-03-24 DIAGNOSIS — J189 Pneumonia, unspecified organism: Secondary | ICD-10-CM | POA: Diagnosis not present

## 2021-03-24 DIAGNOSIS — E039 Hypothyroidism, unspecified: Secondary | ICD-10-CM | POA: Diagnosis not present

## 2021-03-24 DIAGNOSIS — M1990 Unspecified osteoarthritis, unspecified site: Secondary | ICD-10-CM | POA: Diagnosis not present

## 2021-03-27 DIAGNOSIS — E039 Hypothyroidism, unspecified: Secondary | ICD-10-CM | POA: Diagnosis not present

## 2021-03-27 DIAGNOSIS — E785 Hyperlipidemia, unspecified: Secondary | ICD-10-CM | POA: Diagnosis not present

## 2021-03-27 DIAGNOSIS — G309 Alzheimer's disease, unspecified: Secondary | ICD-10-CM | POA: Diagnosis not present

## 2021-03-27 DIAGNOSIS — J189 Pneumonia, unspecified organism: Secondary | ICD-10-CM | POA: Diagnosis not present

## 2021-03-27 DIAGNOSIS — N3946 Mixed incontinence: Secondary | ICD-10-CM | POA: Diagnosis not present

## 2021-03-27 DIAGNOSIS — M1990 Unspecified osteoarthritis, unspecified site: Secondary | ICD-10-CM | POA: Diagnosis not present

## 2021-03-28 DIAGNOSIS — E785 Hyperlipidemia, unspecified: Secondary | ICD-10-CM | POA: Diagnosis not present

## 2021-03-28 DIAGNOSIS — G309 Alzheimer's disease, unspecified: Secondary | ICD-10-CM | POA: Diagnosis not present

## 2021-03-28 DIAGNOSIS — N3946 Mixed incontinence: Secondary | ICD-10-CM | POA: Diagnosis not present

## 2021-03-28 DIAGNOSIS — E039 Hypothyroidism, unspecified: Secondary | ICD-10-CM | POA: Diagnosis not present

## 2021-03-28 DIAGNOSIS — M1990 Unspecified osteoarthritis, unspecified site: Secondary | ICD-10-CM | POA: Diagnosis not present

## 2021-03-28 DIAGNOSIS — J189 Pneumonia, unspecified organism: Secondary | ICD-10-CM | POA: Diagnosis not present

## 2021-03-29 DIAGNOSIS — R531 Weakness: Secondary | ICD-10-CM | POA: Diagnosis not present

## 2021-03-29 DIAGNOSIS — E039 Hypothyroidism, unspecified: Secondary | ICD-10-CM | POA: Diagnosis not present

## 2021-03-29 DIAGNOSIS — M1990 Unspecified osteoarthritis, unspecified site: Secondary | ICD-10-CM | POA: Diagnosis not present

## 2021-03-29 DIAGNOSIS — N3946 Mixed incontinence: Secondary | ICD-10-CM | POA: Diagnosis not present

## 2021-03-29 DIAGNOSIS — R0602 Shortness of breath: Secondary | ICD-10-CM | POA: Diagnosis not present

## 2021-03-29 DIAGNOSIS — E785 Hyperlipidemia, unspecified: Secondary | ICD-10-CM | POA: Diagnosis not present

## 2021-03-29 DIAGNOSIS — G309 Alzheimer's disease, unspecified: Secondary | ICD-10-CM | POA: Diagnosis not present

## 2021-03-29 DIAGNOSIS — J189 Pneumonia, unspecified organism: Secondary | ICD-10-CM | POA: Diagnosis not present

## 2021-03-30 DIAGNOSIS — E039 Hypothyroidism, unspecified: Secondary | ICD-10-CM | POA: Diagnosis not present

## 2021-03-30 DIAGNOSIS — N3946 Mixed incontinence: Secondary | ICD-10-CM | POA: Diagnosis not present

## 2021-03-30 DIAGNOSIS — J189 Pneumonia, unspecified organism: Secondary | ICD-10-CM | POA: Diagnosis not present

## 2021-03-30 DIAGNOSIS — M1990 Unspecified osteoarthritis, unspecified site: Secondary | ICD-10-CM | POA: Diagnosis not present

## 2021-03-30 DIAGNOSIS — G309 Alzheimer's disease, unspecified: Secondary | ICD-10-CM | POA: Diagnosis not present

## 2021-03-30 DIAGNOSIS — E785 Hyperlipidemia, unspecified: Secondary | ICD-10-CM | POA: Diagnosis not present

## 2021-03-31 DIAGNOSIS — E039 Hypothyroidism, unspecified: Secondary | ICD-10-CM | POA: Diagnosis not present

## 2021-03-31 DIAGNOSIS — G309 Alzheimer's disease, unspecified: Secondary | ICD-10-CM | POA: Diagnosis not present

## 2021-03-31 DIAGNOSIS — E785 Hyperlipidemia, unspecified: Secondary | ICD-10-CM | POA: Diagnosis not present

## 2021-03-31 DIAGNOSIS — M1990 Unspecified osteoarthritis, unspecified site: Secondary | ICD-10-CM | POA: Diagnosis not present

## 2021-03-31 DIAGNOSIS — J189 Pneumonia, unspecified organism: Secondary | ICD-10-CM | POA: Diagnosis not present

## 2021-03-31 DIAGNOSIS — N3946 Mixed incontinence: Secondary | ICD-10-CM | POA: Diagnosis not present

## 2021-04-03 DIAGNOSIS — J189 Pneumonia, unspecified organism: Secondary | ICD-10-CM | POA: Diagnosis not present

## 2021-04-03 DIAGNOSIS — G309 Alzheimer's disease, unspecified: Secondary | ICD-10-CM | POA: Diagnosis not present

## 2021-04-03 DIAGNOSIS — E039 Hypothyroidism, unspecified: Secondary | ICD-10-CM | POA: Diagnosis not present

## 2021-04-03 DIAGNOSIS — M1990 Unspecified osteoarthritis, unspecified site: Secondary | ICD-10-CM | POA: Diagnosis not present

## 2021-04-03 DIAGNOSIS — N3946 Mixed incontinence: Secondary | ICD-10-CM | POA: Diagnosis not present

## 2021-04-03 DIAGNOSIS — E785 Hyperlipidemia, unspecified: Secondary | ICD-10-CM | POA: Diagnosis not present

## 2021-04-04 DIAGNOSIS — E785 Hyperlipidemia, unspecified: Secondary | ICD-10-CM | POA: Diagnosis not present

## 2021-04-04 DIAGNOSIS — N3946 Mixed incontinence: Secondary | ICD-10-CM | POA: Diagnosis not present

## 2021-04-04 DIAGNOSIS — M1990 Unspecified osteoarthritis, unspecified site: Secondary | ICD-10-CM | POA: Diagnosis not present

## 2021-04-04 DIAGNOSIS — G309 Alzheimer's disease, unspecified: Secondary | ICD-10-CM | POA: Diagnosis not present

## 2021-04-04 DIAGNOSIS — J189 Pneumonia, unspecified organism: Secondary | ICD-10-CM | POA: Diagnosis not present

## 2021-04-04 DIAGNOSIS — E039 Hypothyroidism, unspecified: Secondary | ICD-10-CM | POA: Diagnosis not present

## 2021-04-05 DIAGNOSIS — E039 Hypothyroidism, unspecified: Secondary | ICD-10-CM | POA: Diagnosis not present

## 2021-04-05 DIAGNOSIS — M1990 Unspecified osteoarthritis, unspecified site: Secondary | ICD-10-CM | POA: Diagnosis not present

## 2021-04-05 DIAGNOSIS — N3946 Mixed incontinence: Secondary | ICD-10-CM | POA: Diagnosis not present

## 2021-04-05 DIAGNOSIS — G309 Alzheimer's disease, unspecified: Secondary | ICD-10-CM | POA: Diagnosis not present

## 2021-04-05 DIAGNOSIS — J189 Pneumonia, unspecified organism: Secondary | ICD-10-CM | POA: Diagnosis not present

## 2021-04-05 DIAGNOSIS — E785 Hyperlipidemia, unspecified: Secondary | ICD-10-CM | POA: Diagnosis not present

## 2021-04-06 DIAGNOSIS — N3946 Mixed incontinence: Secondary | ICD-10-CM | POA: Diagnosis not present

## 2021-04-06 DIAGNOSIS — M1990 Unspecified osteoarthritis, unspecified site: Secondary | ICD-10-CM | POA: Diagnosis not present

## 2021-04-06 DIAGNOSIS — G309 Alzheimer's disease, unspecified: Secondary | ICD-10-CM | POA: Diagnosis not present

## 2021-04-06 DIAGNOSIS — E039 Hypothyroidism, unspecified: Secondary | ICD-10-CM | POA: Diagnosis not present

## 2021-04-06 DIAGNOSIS — J189 Pneumonia, unspecified organism: Secondary | ICD-10-CM | POA: Diagnosis not present

## 2021-04-06 DIAGNOSIS — E785 Hyperlipidemia, unspecified: Secondary | ICD-10-CM | POA: Diagnosis not present

## 2021-04-07 DIAGNOSIS — G309 Alzheimer's disease, unspecified: Secondary | ICD-10-CM | POA: Diagnosis not present

## 2021-04-07 DIAGNOSIS — M1990 Unspecified osteoarthritis, unspecified site: Secondary | ICD-10-CM | POA: Diagnosis not present

## 2021-04-07 DIAGNOSIS — J189 Pneumonia, unspecified organism: Secondary | ICD-10-CM | POA: Diagnosis not present

## 2021-04-07 DIAGNOSIS — E785 Hyperlipidemia, unspecified: Secondary | ICD-10-CM | POA: Diagnosis not present

## 2021-04-07 DIAGNOSIS — N3946 Mixed incontinence: Secondary | ICD-10-CM | POA: Diagnosis not present

## 2021-04-07 DIAGNOSIS — E039 Hypothyroidism, unspecified: Secondary | ICD-10-CM | POA: Diagnosis not present

## 2021-04-10 DIAGNOSIS — E039 Hypothyroidism, unspecified: Secondary | ICD-10-CM | POA: Diagnosis not present

## 2021-04-10 DIAGNOSIS — N3946 Mixed incontinence: Secondary | ICD-10-CM | POA: Diagnosis not present

## 2021-04-10 DIAGNOSIS — G309 Alzheimer's disease, unspecified: Secondary | ICD-10-CM | POA: Diagnosis not present

## 2021-04-10 DIAGNOSIS — E785 Hyperlipidemia, unspecified: Secondary | ICD-10-CM | POA: Diagnosis not present

## 2021-04-10 DIAGNOSIS — M1990 Unspecified osteoarthritis, unspecified site: Secondary | ICD-10-CM | POA: Diagnosis not present

## 2021-04-10 DIAGNOSIS — J189 Pneumonia, unspecified organism: Secondary | ICD-10-CM | POA: Diagnosis not present

## 2021-04-11 DIAGNOSIS — G309 Alzheimer's disease, unspecified: Secondary | ICD-10-CM | POA: Diagnosis not present

## 2021-04-11 DIAGNOSIS — E785 Hyperlipidemia, unspecified: Secondary | ICD-10-CM | POA: Diagnosis not present

## 2021-04-11 DIAGNOSIS — N3946 Mixed incontinence: Secondary | ICD-10-CM | POA: Diagnosis not present

## 2021-04-11 DIAGNOSIS — J189 Pneumonia, unspecified organism: Secondary | ICD-10-CM | POA: Diagnosis not present

## 2021-04-11 DIAGNOSIS — M1990 Unspecified osteoarthritis, unspecified site: Secondary | ICD-10-CM | POA: Diagnosis not present

## 2021-04-11 DIAGNOSIS — E039 Hypothyroidism, unspecified: Secondary | ICD-10-CM | POA: Diagnosis not present

## 2021-04-12 DIAGNOSIS — M1990 Unspecified osteoarthritis, unspecified site: Secondary | ICD-10-CM | POA: Diagnosis not present

## 2021-04-12 DIAGNOSIS — E039 Hypothyroidism, unspecified: Secondary | ICD-10-CM | POA: Diagnosis not present

## 2021-04-12 DIAGNOSIS — E785 Hyperlipidemia, unspecified: Secondary | ICD-10-CM | POA: Diagnosis not present

## 2021-04-12 DIAGNOSIS — G309 Alzheimer's disease, unspecified: Secondary | ICD-10-CM | POA: Diagnosis not present

## 2021-04-12 DIAGNOSIS — J189 Pneumonia, unspecified organism: Secondary | ICD-10-CM | POA: Diagnosis not present

## 2021-04-12 DIAGNOSIS — N3946 Mixed incontinence: Secondary | ICD-10-CM | POA: Diagnosis not present

## 2021-04-13 DIAGNOSIS — G309 Alzheimer's disease, unspecified: Secondary | ICD-10-CM | POA: Diagnosis not present

## 2021-04-13 DIAGNOSIS — E039 Hypothyroidism, unspecified: Secondary | ICD-10-CM | POA: Diagnosis not present

## 2021-04-13 DIAGNOSIS — E785 Hyperlipidemia, unspecified: Secondary | ICD-10-CM | POA: Diagnosis not present

## 2021-04-13 DIAGNOSIS — M1990 Unspecified osteoarthritis, unspecified site: Secondary | ICD-10-CM | POA: Diagnosis not present

## 2021-04-13 DIAGNOSIS — N3946 Mixed incontinence: Secondary | ICD-10-CM | POA: Diagnosis not present

## 2021-04-13 DIAGNOSIS — J189 Pneumonia, unspecified organism: Secondary | ICD-10-CM | POA: Diagnosis not present

## 2021-04-14 DIAGNOSIS — M1990 Unspecified osteoarthritis, unspecified site: Secondary | ICD-10-CM | POA: Diagnosis not present

## 2021-04-14 DIAGNOSIS — E785 Hyperlipidemia, unspecified: Secondary | ICD-10-CM | POA: Diagnosis not present

## 2021-04-14 DIAGNOSIS — N3946 Mixed incontinence: Secondary | ICD-10-CM | POA: Diagnosis not present

## 2021-04-14 DIAGNOSIS — J189 Pneumonia, unspecified organism: Secondary | ICD-10-CM | POA: Diagnosis not present

## 2021-04-14 DIAGNOSIS — E039 Hypothyroidism, unspecified: Secondary | ICD-10-CM | POA: Diagnosis not present

## 2021-04-14 DIAGNOSIS — G309 Alzheimer's disease, unspecified: Secondary | ICD-10-CM | POA: Diagnosis not present

## 2021-04-17 DIAGNOSIS — J189 Pneumonia, unspecified organism: Secondary | ICD-10-CM | POA: Diagnosis not present

## 2021-04-17 DIAGNOSIS — E785 Hyperlipidemia, unspecified: Secondary | ICD-10-CM | POA: Diagnosis not present

## 2021-04-17 DIAGNOSIS — G309 Alzheimer's disease, unspecified: Secondary | ICD-10-CM | POA: Diagnosis not present

## 2021-04-17 DIAGNOSIS — E039 Hypothyroidism, unspecified: Secondary | ICD-10-CM | POA: Diagnosis not present

## 2021-04-17 DIAGNOSIS — N3946 Mixed incontinence: Secondary | ICD-10-CM | POA: Diagnosis not present

## 2021-04-17 DIAGNOSIS — M1990 Unspecified osteoarthritis, unspecified site: Secondary | ICD-10-CM | POA: Diagnosis not present

## 2021-04-18 DIAGNOSIS — J189 Pneumonia, unspecified organism: Secondary | ICD-10-CM | POA: Diagnosis not present

## 2021-04-18 DIAGNOSIS — M1990 Unspecified osteoarthritis, unspecified site: Secondary | ICD-10-CM | POA: Diagnosis not present

## 2021-04-18 DIAGNOSIS — E785 Hyperlipidemia, unspecified: Secondary | ICD-10-CM | POA: Diagnosis not present

## 2021-04-18 DIAGNOSIS — N3946 Mixed incontinence: Secondary | ICD-10-CM | POA: Diagnosis not present

## 2021-04-18 DIAGNOSIS — G309 Alzheimer's disease, unspecified: Secondary | ICD-10-CM | POA: Diagnosis not present

## 2021-04-18 DIAGNOSIS — E039 Hypothyroidism, unspecified: Secondary | ICD-10-CM | POA: Diagnosis not present

## 2021-04-19 DIAGNOSIS — G309 Alzheimer's disease, unspecified: Secondary | ICD-10-CM | POA: Diagnosis not present

## 2021-04-19 DIAGNOSIS — E039 Hypothyroidism, unspecified: Secondary | ICD-10-CM | POA: Diagnosis not present

## 2021-04-19 DIAGNOSIS — N3946 Mixed incontinence: Secondary | ICD-10-CM | POA: Diagnosis not present

## 2021-04-19 DIAGNOSIS — E785 Hyperlipidemia, unspecified: Secondary | ICD-10-CM | POA: Diagnosis not present

## 2021-04-19 DIAGNOSIS — J189 Pneumonia, unspecified organism: Secondary | ICD-10-CM | POA: Diagnosis not present

## 2021-04-19 DIAGNOSIS — M1990 Unspecified osteoarthritis, unspecified site: Secondary | ICD-10-CM | POA: Diagnosis not present

## 2021-04-20 DIAGNOSIS — E039 Hypothyroidism, unspecified: Secondary | ICD-10-CM | POA: Diagnosis not present

## 2021-04-20 DIAGNOSIS — J189 Pneumonia, unspecified organism: Secondary | ICD-10-CM | POA: Diagnosis not present

## 2021-04-20 DIAGNOSIS — G309 Alzheimer's disease, unspecified: Secondary | ICD-10-CM | POA: Diagnosis not present

## 2021-04-20 DIAGNOSIS — N3946 Mixed incontinence: Secondary | ICD-10-CM | POA: Diagnosis not present

## 2021-04-20 DIAGNOSIS — M1990 Unspecified osteoarthritis, unspecified site: Secondary | ICD-10-CM | POA: Diagnosis not present

## 2021-04-20 DIAGNOSIS — E785 Hyperlipidemia, unspecified: Secondary | ICD-10-CM | POA: Diagnosis not present

## 2021-04-21 DIAGNOSIS — J189 Pneumonia, unspecified organism: Secondary | ICD-10-CM | POA: Diagnosis not present

## 2021-04-21 DIAGNOSIS — E785 Hyperlipidemia, unspecified: Secondary | ICD-10-CM | POA: Diagnosis not present

## 2021-04-21 DIAGNOSIS — M1990 Unspecified osteoarthritis, unspecified site: Secondary | ICD-10-CM | POA: Diagnosis not present

## 2021-04-21 DIAGNOSIS — N3946 Mixed incontinence: Secondary | ICD-10-CM | POA: Diagnosis not present

## 2021-04-21 DIAGNOSIS — G309 Alzheimer's disease, unspecified: Secondary | ICD-10-CM | POA: Diagnosis not present

## 2021-04-21 DIAGNOSIS — E039 Hypothyroidism, unspecified: Secondary | ICD-10-CM | POA: Diagnosis not present

## 2021-04-24 DIAGNOSIS — J189 Pneumonia, unspecified organism: Secondary | ICD-10-CM | POA: Diagnosis not present

## 2021-04-24 DIAGNOSIS — E039 Hypothyroidism, unspecified: Secondary | ICD-10-CM | POA: Diagnosis not present

## 2021-04-24 DIAGNOSIS — E785 Hyperlipidemia, unspecified: Secondary | ICD-10-CM | POA: Diagnosis not present

## 2021-04-24 DIAGNOSIS — G309 Alzheimer's disease, unspecified: Secondary | ICD-10-CM | POA: Diagnosis not present

## 2021-04-24 DIAGNOSIS — M1990 Unspecified osteoarthritis, unspecified site: Secondary | ICD-10-CM | POA: Diagnosis not present

## 2021-04-24 DIAGNOSIS — N3946 Mixed incontinence: Secondary | ICD-10-CM | POA: Diagnosis not present

## 2021-04-25 DIAGNOSIS — E039 Hypothyroidism, unspecified: Secondary | ICD-10-CM | POA: Diagnosis not present

## 2021-04-25 DIAGNOSIS — G309 Alzheimer's disease, unspecified: Secondary | ICD-10-CM | POA: Diagnosis not present

## 2021-04-25 DIAGNOSIS — M1990 Unspecified osteoarthritis, unspecified site: Secondary | ICD-10-CM | POA: Diagnosis not present

## 2021-04-25 DIAGNOSIS — E785 Hyperlipidemia, unspecified: Secondary | ICD-10-CM | POA: Diagnosis not present

## 2021-04-25 DIAGNOSIS — J189 Pneumonia, unspecified organism: Secondary | ICD-10-CM | POA: Diagnosis not present

## 2021-04-25 DIAGNOSIS — N3946 Mixed incontinence: Secondary | ICD-10-CM | POA: Diagnosis not present

## 2021-04-26 DIAGNOSIS — E785 Hyperlipidemia, unspecified: Secondary | ICD-10-CM | POA: Diagnosis not present

## 2021-04-26 DIAGNOSIS — E039 Hypothyroidism, unspecified: Secondary | ICD-10-CM | POA: Diagnosis not present

## 2021-04-26 DIAGNOSIS — J189 Pneumonia, unspecified organism: Secondary | ICD-10-CM | POA: Diagnosis not present

## 2021-04-26 DIAGNOSIS — G309 Alzheimer's disease, unspecified: Secondary | ICD-10-CM | POA: Diagnosis not present

## 2021-04-26 DIAGNOSIS — N3946 Mixed incontinence: Secondary | ICD-10-CM | POA: Diagnosis not present

## 2021-04-26 DIAGNOSIS — M1990 Unspecified osteoarthritis, unspecified site: Secondary | ICD-10-CM | POA: Diagnosis not present

## 2021-04-28 DIAGNOSIS — E785 Hyperlipidemia, unspecified: Secondary | ICD-10-CM | POA: Diagnosis not present

## 2021-04-28 DIAGNOSIS — E039 Hypothyroidism, unspecified: Secondary | ICD-10-CM | POA: Diagnosis not present

## 2021-04-28 DIAGNOSIS — M1990 Unspecified osteoarthritis, unspecified site: Secondary | ICD-10-CM | POA: Diagnosis not present

## 2021-04-28 DIAGNOSIS — G309 Alzheimer's disease, unspecified: Secondary | ICD-10-CM | POA: Diagnosis not present

## 2021-04-28 DIAGNOSIS — R0602 Shortness of breath: Secondary | ICD-10-CM | POA: Diagnosis not present

## 2021-04-28 DIAGNOSIS — J189 Pneumonia, unspecified organism: Secondary | ICD-10-CM | POA: Diagnosis not present

## 2021-04-28 DIAGNOSIS — R531 Weakness: Secondary | ICD-10-CM | POA: Diagnosis not present

## 2021-04-28 DIAGNOSIS — N3946 Mixed incontinence: Secondary | ICD-10-CM | POA: Diagnosis not present

## 2021-05-03 DIAGNOSIS — G309 Alzheimer's disease, unspecified: Secondary | ICD-10-CM | POA: Diagnosis not present

## 2021-05-03 DIAGNOSIS — M1990 Unspecified osteoarthritis, unspecified site: Secondary | ICD-10-CM | POA: Diagnosis not present

## 2021-05-03 DIAGNOSIS — N3946 Mixed incontinence: Secondary | ICD-10-CM | POA: Diagnosis not present

## 2021-05-03 DIAGNOSIS — E785 Hyperlipidemia, unspecified: Secondary | ICD-10-CM | POA: Diagnosis not present

## 2021-05-03 DIAGNOSIS — E039 Hypothyroidism, unspecified: Secondary | ICD-10-CM | POA: Diagnosis not present

## 2021-05-03 DIAGNOSIS — J189 Pneumonia, unspecified organism: Secondary | ICD-10-CM | POA: Diagnosis not present

## 2021-05-04 DIAGNOSIS — J189 Pneumonia, unspecified organism: Secondary | ICD-10-CM | POA: Diagnosis not present

## 2021-05-04 DIAGNOSIS — M1990 Unspecified osteoarthritis, unspecified site: Secondary | ICD-10-CM | POA: Diagnosis not present

## 2021-05-04 DIAGNOSIS — G309 Alzheimer's disease, unspecified: Secondary | ICD-10-CM | POA: Diagnosis not present

## 2021-05-04 DIAGNOSIS — E785 Hyperlipidemia, unspecified: Secondary | ICD-10-CM | POA: Diagnosis not present

## 2021-05-04 DIAGNOSIS — N3946 Mixed incontinence: Secondary | ICD-10-CM | POA: Diagnosis not present

## 2021-05-04 DIAGNOSIS — E039 Hypothyroidism, unspecified: Secondary | ICD-10-CM | POA: Diagnosis not present

## 2021-05-05 DIAGNOSIS — N3946 Mixed incontinence: Secondary | ICD-10-CM | POA: Diagnosis not present

## 2021-05-05 DIAGNOSIS — E039 Hypothyroidism, unspecified: Secondary | ICD-10-CM | POA: Diagnosis not present

## 2021-05-05 DIAGNOSIS — G309 Alzheimer's disease, unspecified: Secondary | ICD-10-CM | POA: Diagnosis not present

## 2021-05-05 DIAGNOSIS — M1990 Unspecified osteoarthritis, unspecified site: Secondary | ICD-10-CM | POA: Diagnosis not present

## 2021-05-05 DIAGNOSIS — E785 Hyperlipidemia, unspecified: Secondary | ICD-10-CM | POA: Diagnosis not present

## 2021-05-05 DIAGNOSIS — J189 Pneumonia, unspecified organism: Secondary | ICD-10-CM | POA: Diagnosis not present

## 2021-05-08 DIAGNOSIS — G309 Alzheimer's disease, unspecified: Secondary | ICD-10-CM | POA: Diagnosis not present

## 2021-05-08 DIAGNOSIS — N3946 Mixed incontinence: Secondary | ICD-10-CM | POA: Diagnosis not present

## 2021-05-08 DIAGNOSIS — E039 Hypothyroidism, unspecified: Secondary | ICD-10-CM | POA: Diagnosis not present

## 2021-05-08 DIAGNOSIS — E785 Hyperlipidemia, unspecified: Secondary | ICD-10-CM | POA: Diagnosis not present

## 2021-05-08 DIAGNOSIS — M1990 Unspecified osteoarthritis, unspecified site: Secondary | ICD-10-CM | POA: Diagnosis not present

## 2021-05-08 DIAGNOSIS — J189 Pneumonia, unspecified organism: Secondary | ICD-10-CM | POA: Diagnosis not present

## 2021-05-09 DIAGNOSIS — G309 Alzheimer's disease, unspecified: Secondary | ICD-10-CM | POA: Diagnosis not present

## 2021-05-09 DIAGNOSIS — N3946 Mixed incontinence: Secondary | ICD-10-CM | POA: Diagnosis not present

## 2021-05-09 DIAGNOSIS — E785 Hyperlipidemia, unspecified: Secondary | ICD-10-CM | POA: Diagnosis not present

## 2021-05-09 DIAGNOSIS — J189 Pneumonia, unspecified organism: Secondary | ICD-10-CM | POA: Diagnosis not present

## 2021-05-09 DIAGNOSIS — E039 Hypothyroidism, unspecified: Secondary | ICD-10-CM | POA: Diagnosis not present

## 2021-05-09 DIAGNOSIS — M1990 Unspecified osteoarthritis, unspecified site: Secondary | ICD-10-CM | POA: Diagnosis not present

## 2021-05-10 DIAGNOSIS — E785 Hyperlipidemia, unspecified: Secondary | ICD-10-CM | POA: Diagnosis not present

## 2021-05-10 DIAGNOSIS — J189 Pneumonia, unspecified organism: Secondary | ICD-10-CM | POA: Diagnosis not present

## 2021-05-10 DIAGNOSIS — N3946 Mixed incontinence: Secondary | ICD-10-CM | POA: Diagnosis not present

## 2021-05-10 DIAGNOSIS — M1990 Unspecified osteoarthritis, unspecified site: Secondary | ICD-10-CM | POA: Diagnosis not present

## 2021-05-10 DIAGNOSIS — G309 Alzheimer's disease, unspecified: Secondary | ICD-10-CM | POA: Diagnosis not present

## 2021-05-10 DIAGNOSIS — E039 Hypothyroidism, unspecified: Secondary | ICD-10-CM | POA: Diagnosis not present

## 2021-05-11 DIAGNOSIS — M1990 Unspecified osteoarthritis, unspecified site: Secondary | ICD-10-CM | POA: Diagnosis not present

## 2021-05-11 DIAGNOSIS — G309 Alzheimer's disease, unspecified: Secondary | ICD-10-CM | POA: Diagnosis not present

## 2021-05-11 DIAGNOSIS — J189 Pneumonia, unspecified organism: Secondary | ICD-10-CM | POA: Diagnosis not present

## 2021-05-11 DIAGNOSIS — E785 Hyperlipidemia, unspecified: Secondary | ICD-10-CM | POA: Diagnosis not present

## 2021-05-11 DIAGNOSIS — E039 Hypothyroidism, unspecified: Secondary | ICD-10-CM | POA: Diagnosis not present

## 2021-05-11 DIAGNOSIS — N3946 Mixed incontinence: Secondary | ICD-10-CM | POA: Diagnosis not present

## 2021-05-12 DIAGNOSIS — G309 Alzheimer's disease, unspecified: Secondary | ICD-10-CM | POA: Diagnosis not present

## 2021-05-12 DIAGNOSIS — E039 Hypothyroidism, unspecified: Secondary | ICD-10-CM | POA: Diagnosis not present

## 2021-05-12 DIAGNOSIS — E785 Hyperlipidemia, unspecified: Secondary | ICD-10-CM | POA: Diagnosis not present

## 2021-05-12 DIAGNOSIS — N3946 Mixed incontinence: Secondary | ICD-10-CM | POA: Diagnosis not present

## 2021-05-12 DIAGNOSIS — J189 Pneumonia, unspecified organism: Secondary | ICD-10-CM | POA: Diagnosis not present

## 2021-05-12 DIAGNOSIS — M1990 Unspecified osteoarthritis, unspecified site: Secondary | ICD-10-CM | POA: Diagnosis not present

## 2021-05-15 DIAGNOSIS — M1990 Unspecified osteoarthritis, unspecified site: Secondary | ICD-10-CM | POA: Diagnosis not present

## 2021-05-15 DIAGNOSIS — E785 Hyperlipidemia, unspecified: Secondary | ICD-10-CM | POA: Diagnosis not present

## 2021-05-15 DIAGNOSIS — J189 Pneumonia, unspecified organism: Secondary | ICD-10-CM | POA: Diagnosis not present

## 2021-05-15 DIAGNOSIS — G309 Alzheimer's disease, unspecified: Secondary | ICD-10-CM | POA: Diagnosis not present

## 2021-05-15 DIAGNOSIS — N3946 Mixed incontinence: Secondary | ICD-10-CM | POA: Diagnosis not present

## 2021-05-15 DIAGNOSIS — E039 Hypothyroidism, unspecified: Secondary | ICD-10-CM | POA: Diagnosis not present

## 2021-05-16 DIAGNOSIS — E039 Hypothyroidism, unspecified: Secondary | ICD-10-CM | POA: Diagnosis not present

## 2021-05-16 DIAGNOSIS — N3946 Mixed incontinence: Secondary | ICD-10-CM | POA: Diagnosis not present

## 2021-05-16 DIAGNOSIS — G309 Alzheimer's disease, unspecified: Secondary | ICD-10-CM | POA: Diagnosis not present

## 2021-05-16 DIAGNOSIS — E785 Hyperlipidemia, unspecified: Secondary | ICD-10-CM | POA: Diagnosis not present

## 2021-05-16 DIAGNOSIS — J189 Pneumonia, unspecified organism: Secondary | ICD-10-CM | POA: Diagnosis not present

## 2021-05-16 DIAGNOSIS — M1990 Unspecified osteoarthritis, unspecified site: Secondary | ICD-10-CM | POA: Diagnosis not present

## 2021-05-23 DIAGNOSIS — J189 Pneumonia, unspecified organism: Secondary | ICD-10-CM | POA: Diagnosis not present

## 2021-05-23 DIAGNOSIS — N3946 Mixed incontinence: Secondary | ICD-10-CM | POA: Diagnosis not present

## 2021-05-23 DIAGNOSIS — M1990 Unspecified osteoarthritis, unspecified site: Secondary | ICD-10-CM | POA: Diagnosis not present

## 2021-05-23 DIAGNOSIS — G309 Alzheimer's disease, unspecified: Secondary | ICD-10-CM | POA: Diagnosis not present

## 2021-05-23 DIAGNOSIS — E785 Hyperlipidemia, unspecified: Secondary | ICD-10-CM | POA: Diagnosis not present

## 2021-05-23 DIAGNOSIS — E039 Hypothyroidism, unspecified: Secondary | ICD-10-CM | POA: Diagnosis not present

## 2021-05-26 DIAGNOSIS — J189 Pneumonia, unspecified organism: Secondary | ICD-10-CM | POA: Diagnosis not present

## 2021-05-26 DIAGNOSIS — G309 Alzheimer's disease, unspecified: Secondary | ICD-10-CM | POA: Diagnosis not present

## 2021-05-26 DIAGNOSIS — E785 Hyperlipidemia, unspecified: Secondary | ICD-10-CM | POA: Diagnosis not present

## 2021-05-26 DIAGNOSIS — E039 Hypothyroidism, unspecified: Secondary | ICD-10-CM | POA: Diagnosis not present

## 2021-05-26 DIAGNOSIS — N3946 Mixed incontinence: Secondary | ICD-10-CM | POA: Diagnosis not present

## 2021-05-26 DIAGNOSIS — M1990 Unspecified osteoarthritis, unspecified site: Secondary | ICD-10-CM | POA: Diagnosis not present

## 2021-05-29 DIAGNOSIS — E039 Hypothyroidism, unspecified: Secondary | ICD-10-CM | POA: Diagnosis not present

## 2021-05-29 DIAGNOSIS — M1990 Unspecified osteoarthritis, unspecified site: Secondary | ICD-10-CM | POA: Diagnosis not present

## 2021-05-29 DIAGNOSIS — J189 Pneumonia, unspecified organism: Secondary | ICD-10-CM | POA: Diagnosis not present

## 2021-05-29 DIAGNOSIS — N3946 Mixed incontinence: Secondary | ICD-10-CM | POA: Diagnosis not present

## 2021-05-29 DIAGNOSIS — R0602 Shortness of breath: Secondary | ICD-10-CM | POA: Diagnosis not present

## 2021-05-29 DIAGNOSIS — G309 Alzheimer's disease, unspecified: Secondary | ICD-10-CM | POA: Diagnosis not present

## 2021-05-29 DIAGNOSIS — R531 Weakness: Secondary | ICD-10-CM | POA: Diagnosis not present

## 2021-05-29 DIAGNOSIS — E785 Hyperlipidemia, unspecified: Secondary | ICD-10-CM | POA: Diagnosis not present

## 2021-05-30 DIAGNOSIS — N3946 Mixed incontinence: Secondary | ICD-10-CM | POA: Diagnosis not present

## 2021-05-30 DIAGNOSIS — G309 Alzheimer's disease, unspecified: Secondary | ICD-10-CM | POA: Diagnosis not present

## 2021-05-30 DIAGNOSIS — E039 Hypothyroidism, unspecified: Secondary | ICD-10-CM | POA: Diagnosis not present

## 2021-05-30 DIAGNOSIS — M1990 Unspecified osteoarthritis, unspecified site: Secondary | ICD-10-CM | POA: Diagnosis not present

## 2021-05-30 DIAGNOSIS — E785 Hyperlipidemia, unspecified: Secondary | ICD-10-CM | POA: Diagnosis not present

## 2021-05-30 DIAGNOSIS — J189 Pneumonia, unspecified organism: Secondary | ICD-10-CM | POA: Diagnosis not present

## 2021-06-01 DIAGNOSIS — G309 Alzheimer's disease, unspecified: Secondary | ICD-10-CM | POA: Diagnosis not present

## 2021-06-01 DIAGNOSIS — E785 Hyperlipidemia, unspecified: Secondary | ICD-10-CM | POA: Diagnosis not present

## 2021-06-01 DIAGNOSIS — N3946 Mixed incontinence: Secondary | ICD-10-CM | POA: Diagnosis not present

## 2021-06-01 DIAGNOSIS — M1990 Unspecified osteoarthritis, unspecified site: Secondary | ICD-10-CM | POA: Diagnosis not present

## 2021-06-01 DIAGNOSIS — J189 Pneumonia, unspecified organism: Secondary | ICD-10-CM | POA: Diagnosis not present

## 2021-06-01 DIAGNOSIS — E039 Hypothyroidism, unspecified: Secondary | ICD-10-CM | POA: Diagnosis not present

## 2021-06-02 DIAGNOSIS — J189 Pneumonia, unspecified organism: Secondary | ICD-10-CM | POA: Diagnosis not present

## 2021-06-02 DIAGNOSIS — E785 Hyperlipidemia, unspecified: Secondary | ICD-10-CM | POA: Diagnosis not present

## 2021-06-02 DIAGNOSIS — E039 Hypothyroidism, unspecified: Secondary | ICD-10-CM | POA: Diagnosis not present

## 2021-06-02 DIAGNOSIS — N3946 Mixed incontinence: Secondary | ICD-10-CM | POA: Diagnosis not present

## 2021-06-02 DIAGNOSIS — G309 Alzheimer's disease, unspecified: Secondary | ICD-10-CM | POA: Diagnosis not present

## 2021-06-02 DIAGNOSIS — M1990 Unspecified osteoarthritis, unspecified site: Secondary | ICD-10-CM | POA: Diagnosis not present

## 2021-06-05 DIAGNOSIS — N3946 Mixed incontinence: Secondary | ICD-10-CM | POA: Diagnosis not present

## 2021-06-05 DIAGNOSIS — E785 Hyperlipidemia, unspecified: Secondary | ICD-10-CM | POA: Diagnosis not present

## 2021-06-05 DIAGNOSIS — M1990 Unspecified osteoarthritis, unspecified site: Secondary | ICD-10-CM | POA: Diagnosis not present

## 2021-06-05 DIAGNOSIS — G309 Alzheimer's disease, unspecified: Secondary | ICD-10-CM | POA: Diagnosis not present

## 2021-06-05 DIAGNOSIS — E039 Hypothyroidism, unspecified: Secondary | ICD-10-CM | POA: Diagnosis not present

## 2021-06-05 DIAGNOSIS — J189 Pneumonia, unspecified organism: Secondary | ICD-10-CM | POA: Diagnosis not present

## 2021-06-06 DIAGNOSIS — N3946 Mixed incontinence: Secondary | ICD-10-CM | POA: Diagnosis not present

## 2021-06-06 DIAGNOSIS — M1990 Unspecified osteoarthritis, unspecified site: Secondary | ICD-10-CM | POA: Diagnosis not present

## 2021-06-06 DIAGNOSIS — E039 Hypothyroidism, unspecified: Secondary | ICD-10-CM | POA: Diagnosis not present

## 2021-06-06 DIAGNOSIS — G309 Alzheimer's disease, unspecified: Secondary | ICD-10-CM | POA: Diagnosis not present

## 2021-06-06 DIAGNOSIS — J189 Pneumonia, unspecified organism: Secondary | ICD-10-CM | POA: Diagnosis not present

## 2021-06-06 DIAGNOSIS — E785 Hyperlipidemia, unspecified: Secondary | ICD-10-CM | POA: Diagnosis not present

## 2021-06-07 DIAGNOSIS — G309 Alzheimer's disease, unspecified: Secondary | ICD-10-CM | POA: Diagnosis not present

## 2021-06-07 DIAGNOSIS — N3946 Mixed incontinence: Secondary | ICD-10-CM | POA: Diagnosis not present

## 2021-06-07 DIAGNOSIS — J189 Pneumonia, unspecified organism: Secondary | ICD-10-CM | POA: Diagnosis not present

## 2021-06-07 DIAGNOSIS — E785 Hyperlipidemia, unspecified: Secondary | ICD-10-CM | POA: Diagnosis not present

## 2021-06-07 DIAGNOSIS — M1990 Unspecified osteoarthritis, unspecified site: Secondary | ICD-10-CM | POA: Diagnosis not present

## 2021-06-07 DIAGNOSIS — E039 Hypothyroidism, unspecified: Secondary | ICD-10-CM | POA: Diagnosis not present

## 2021-06-08 DIAGNOSIS — E785 Hyperlipidemia, unspecified: Secondary | ICD-10-CM | POA: Diagnosis not present

## 2021-06-08 DIAGNOSIS — N3946 Mixed incontinence: Secondary | ICD-10-CM | POA: Diagnosis not present

## 2021-06-08 DIAGNOSIS — M1990 Unspecified osteoarthritis, unspecified site: Secondary | ICD-10-CM | POA: Diagnosis not present

## 2021-06-08 DIAGNOSIS — J189 Pneumonia, unspecified organism: Secondary | ICD-10-CM | POA: Diagnosis not present

## 2021-06-08 DIAGNOSIS — G309 Alzheimer's disease, unspecified: Secondary | ICD-10-CM | POA: Diagnosis not present

## 2021-06-08 DIAGNOSIS — E039 Hypothyroidism, unspecified: Secondary | ICD-10-CM | POA: Diagnosis not present

## 2021-06-09 DIAGNOSIS — E785 Hyperlipidemia, unspecified: Secondary | ICD-10-CM | POA: Diagnosis not present

## 2021-06-09 DIAGNOSIS — M1990 Unspecified osteoarthritis, unspecified site: Secondary | ICD-10-CM | POA: Diagnosis not present

## 2021-06-09 DIAGNOSIS — N3946 Mixed incontinence: Secondary | ICD-10-CM | POA: Diagnosis not present

## 2021-06-09 DIAGNOSIS — J189 Pneumonia, unspecified organism: Secondary | ICD-10-CM | POA: Diagnosis not present

## 2021-06-09 DIAGNOSIS — E039 Hypothyroidism, unspecified: Secondary | ICD-10-CM | POA: Diagnosis not present

## 2021-06-09 DIAGNOSIS — G309 Alzheimer's disease, unspecified: Secondary | ICD-10-CM | POA: Diagnosis not present

## 2021-06-12 DIAGNOSIS — N3946 Mixed incontinence: Secondary | ICD-10-CM | POA: Diagnosis not present

## 2021-06-12 DIAGNOSIS — J189 Pneumonia, unspecified organism: Secondary | ICD-10-CM | POA: Diagnosis not present

## 2021-06-12 DIAGNOSIS — E039 Hypothyroidism, unspecified: Secondary | ICD-10-CM | POA: Diagnosis not present

## 2021-06-12 DIAGNOSIS — E785 Hyperlipidemia, unspecified: Secondary | ICD-10-CM | POA: Diagnosis not present

## 2021-06-12 DIAGNOSIS — G309 Alzheimer's disease, unspecified: Secondary | ICD-10-CM | POA: Diagnosis not present

## 2021-06-12 DIAGNOSIS — M1990 Unspecified osteoarthritis, unspecified site: Secondary | ICD-10-CM | POA: Diagnosis not present

## 2021-06-13 DIAGNOSIS — J189 Pneumonia, unspecified organism: Secondary | ICD-10-CM | POA: Diagnosis not present

## 2021-06-13 DIAGNOSIS — G309 Alzheimer's disease, unspecified: Secondary | ICD-10-CM | POA: Diagnosis not present

## 2021-06-13 DIAGNOSIS — N3946 Mixed incontinence: Secondary | ICD-10-CM | POA: Diagnosis not present

## 2021-06-13 DIAGNOSIS — M1990 Unspecified osteoarthritis, unspecified site: Secondary | ICD-10-CM | POA: Diagnosis not present

## 2021-06-13 DIAGNOSIS — E039 Hypothyroidism, unspecified: Secondary | ICD-10-CM | POA: Diagnosis not present

## 2021-06-13 DIAGNOSIS — E785 Hyperlipidemia, unspecified: Secondary | ICD-10-CM | POA: Diagnosis not present

## 2021-06-15 DIAGNOSIS — G309 Alzheimer's disease, unspecified: Secondary | ICD-10-CM | POA: Diagnosis not present

## 2021-06-15 DIAGNOSIS — E039 Hypothyroidism, unspecified: Secondary | ICD-10-CM | POA: Diagnosis not present

## 2021-06-15 DIAGNOSIS — N3946 Mixed incontinence: Secondary | ICD-10-CM | POA: Diagnosis not present

## 2021-06-15 DIAGNOSIS — M1990 Unspecified osteoarthritis, unspecified site: Secondary | ICD-10-CM | POA: Diagnosis not present

## 2021-06-15 DIAGNOSIS — E785 Hyperlipidemia, unspecified: Secondary | ICD-10-CM | POA: Diagnosis not present

## 2021-06-15 DIAGNOSIS — J189 Pneumonia, unspecified organism: Secondary | ICD-10-CM | POA: Diagnosis not present

## 2021-06-16 DIAGNOSIS — N3946 Mixed incontinence: Secondary | ICD-10-CM | POA: Diagnosis not present

## 2021-06-16 DIAGNOSIS — J189 Pneumonia, unspecified organism: Secondary | ICD-10-CM | POA: Diagnosis not present

## 2021-06-16 DIAGNOSIS — G309 Alzheimer's disease, unspecified: Secondary | ICD-10-CM | POA: Diagnosis not present

## 2021-06-16 DIAGNOSIS — M1990 Unspecified osteoarthritis, unspecified site: Secondary | ICD-10-CM | POA: Diagnosis not present

## 2021-06-16 DIAGNOSIS — E039 Hypothyroidism, unspecified: Secondary | ICD-10-CM | POA: Diagnosis not present

## 2021-06-16 DIAGNOSIS — E785 Hyperlipidemia, unspecified: Secondary | ICD-10-CM | POA: Diagnosis not present

## 2021-06-19 DIAGNOSIS — M1990 Unspecified osteoarthritis, unspecified site: Secondary | ICD-10-CM | POA: Diagnosis not present

## 2021-06-19 DIAGNOSIS — E039 Hypothyroidism, unspecified: Secondary | ICD-10-CM | POA: Diagnosis not present

## 2021-06-19 DIAGNOSIS — J189 Pneumonia, unspecified organism: Secondary | ICD-10-CM | POA: Diagnosis not present

## 2021-06-19 DIAGNOSIS — N3946 Mixed incontinence: Secondary | ICD-10-CM | POA: Diagnosis not present

## 2021-06-19 DIAGNOSIS — G309 Alzheimer's disease, unspecified: Secondary | ICD-10-CM | POA: Diagnosis not present

## 2021-06-19 DIAGNOSIS — E785 Hyperlipidemia, unspecified: Secondary | ICD-10-CM | POA: Diagnosis not present

## 2021-06-20 DIAGNOSIS — G309 Alzheimer's disease, unspecified: Secondary | ICD-10-CM | POA: Diagnosis not present

## 2021-06-20 DIAGNOSIS — E039 Hypothyroidism, unspecified: Secondary | ICD-10-CM | POA: Diagnosis not present

## 2021-06-20 DIAGNOSIS — N3946 Mixed incontinence: Secondary | ICD-10-CM | POA: Diagnosis not present

## 2021-06-20 DIAGNOSIS — E785 Hyperlipidemia, unspecified: Secondary | ICD-10-CM | POA: Diagnosis not present

## 2021-06-20 DIAGNOSIS — J189 Pneumonia, unspecified organism: Secondary | ICD-10-CM | POA: Diagnosis not present

## 2021-06-20 DIAGNOSIS — M1990 Unspecified osteoarthritis, unspecified site: Secondary | ICD-10-CM | POA: Diagnosis not present

## 2021-06-21 DIAGNOSIS — E785 Hyperlipidemia, unspecified: Secondary | ICD-10-CM | POA: Diagnosis not present

## 2021-06-21 DIAGNOSIS — M1990 Unspecified osteoarthritis, unspecified site: Secondary | ICD-10-CM | POA: Diagnosis not present

## 2021-06-21 DIAGNOSIS — G309 Alzheimer's disease, unspecified: Secondary | ICD-10-CM | POA: Diagnosis not present

## 2021-06-21 DIAGNOSIS — J189 Pneumonia, unspecified organism: Secondary | ICD-10-CM | POA: Diagnosis not present

## 2021-06-21 DIAGNOSIS — E039 Hypothyroidism, unspecified: Secondary | ICD-10-CM | POA: Diagnosis not present

## 2021-06-21 DIAGNOSIS — N3946 Mixed incontinence: Secondary | ICD-10-CM | POA: Diagnosis not present

## 2021-06-23 DIAGNOSIS — N3946 Mixed incontinence: Secondary | ICD-10-CM | POA: Diagnosis not present

## 2021-06-23 DIAGNOSIS — J189 Pneumonia, unspecified organism: Secondary | ICD-10-CM | POA: Diagnosis not present

## 2021-06-23 DIAGNOSIS — M1990 Unspecified osteoarthritis, unspecified site: Secondary | ICD-10-CM | POA: Diagnosis not present

## 2021-06-23 DIAGNOSIS — E039 Hypothyroidism, unspecified: Secondary | ICD-10-CM | POA: Diagnosis not present

## 2021-06-23 DIAGNOSIS — G309 Alzheimer's disease, unspecified: Secondary | ICD-10-CM | POA: Diagnosis not present

## 2021-06-23 DIAGNOSIS — E785 Hyperlipidemia, unspecified: Secondary | ICD-10-CM | POA: Diagnosis not present

## 2021-06-26 DIAGNOSIS — N3946 Mixed incontinence: Secondary | ICD-10-CM | POA: Diagnosis not present

## 2021-06-26 DIAGNOSIS — E785 Hyperlipidemia, unspecified: Secondary | ICD-10-CM | POA: Diagnosis not present

## 2021-06-26 DIAGNOSIS — G309 Alzheimer's disease, unspecified: Secondary | ICD-10-CM | POA: Diagnosis not present

## 2021-06-26 DIAGNOSIS — E039 Hypothyroidism, unspecified: Secondary | ICD-10-CM | POA: Diagnosis not present

## 2021-06-26 DIAGNOSIS — J189 Pneumonia, unspecified organism: Secondary | ICD-10-CM | POA: Diagnosis not present

## 2021-06-26 DIAGNOSIS — M1990 Unspecified osteoarthritis, unspecified site: Secondary | ICD-10-CM | POA: Diagnosis not present

## 2021-06-27 DIAGNOSIS — E039 Hypothyroidism, unspecified: Secondary | ICD-10-CM | POA: Diagnosis not present

## 2021-06-27 DIAGNOSIS — G309 Alzheimer's disease, unspecified: Secondary | ICD-10-CM | POA: Diagnosis not present

## 2021-06-27 DIAGNOSIS — M1990 Unspecified osteoarthritis, unspecified site: Secondary | ICD-10-CM | POA: Diagnosis not present

## 2021-06-27 DIAGNOSIS — E785 Hyperlipidemia, unspecified: Secondary | ICD-10-CM | POA: Diagnosis not present

## 2021-06-27 DIAGNOSIS — N3946 Mixed incontinence: Secondary | ICD-10-CM | POA: Diagnosis not present

## 2021-06-27 DIAGNOSIS — J189 Pneumonia, unspecified organism: Secondary | ICD-10-CM | POA: Diagnosis not present

## 2021-06-28 DIAGNOSIS — E785 Hyperlipidemia, unspecified: Secondary | ICD-10-CM | POA: Diagnosis not present

## 2021-06-28 DIAGNOSIS — M1990 Unspecified osteoarthritis, unspecified site: Secondary | ICD-10-CM | POA: Diagnosis not present

## 2021-06-28 DIAGNOSIS — J189 Pneumonia, unspecified organism: Secondary | ICD-10-CM | POA: Diagnosis not present

## 2021-06-28 DIAGNOSIS — N3946 Mixed incontinence: Secondary | ICD-10-CM | POA: Diagnosis not present

## 2021-06-28 DIAGNOSIS — E039 Hypothyroidism, unspecified: Secondary | ICD-10-CM | POA: Diagnosis not present

## 2021-06-28 DIAGNOSIS — G309 Alzheimer's disease, unspecified: Secondary | ICD-10-CM | POA: Diagnosis not present

## 2021-06-29 DIAGNOSIS — G309 Alzheimer's disease, unspecified: Secondary | ICD-10-CM | POA: Diagnosis not present

## 2021-06-29 DIAGNOSIS — R531 Weakness: Secondary | ICD-10-CM | POA: Diagnosis not present

## 2021-06-29 DIAGNOSIS — N3946 Mixed incontinence: Secondary | ICD-10-CM | POA: Diagnosis not present

## 2021-06-29 DIAGNOSIS — E039 Hypothyroidism, unspecified: Secondary | ICD-10-CM | POA: Diagnosis not present

## 2021-06-29 DIAGNOSIS — E785 Hyperlipidemia, unspecified: Secondary | ICD-10-CM | POA: Diagnosis not present

## 2021-06-29 DIAGNOSIS — M1990 Unspecified osteoarthritis, unspecified site: Secondary | ICD-10-CM | POA: Diagnosis not present

## 2021-06-29 DIAGNOSIS — J189 Pneumonia, unspecified organism: Secondary | ICD-10-CM | POA: Diagnosis not present

## 2021-06-29 DIAGNOSIS — R0602 Shortness of breath: Secondary | ICD-10-CM | POA: Diagnosis not present

## 2021-06-30 DIAGNOSIS — G309 Alzheimer's disease, unspecified: Secondary | ICD-10-CM | POA: Diagnosis not present

## 2021-06-30 DIAGNOSIS — M1990 Unspecified osteoarthritis, unspecified site: Secondary | ICD-10-CM | POA: Diagnosis not present

## 2021-06-30 DIAGNOSIS — E785 Hyperlipidemia, unspecified: Secondary | ICD-10-CM | POA: Diagnosis not present

## 2021-06-30 DIAGNOSIS — E039 Hypothyroidism, unspecified: Secondary | ICD-10-CM | POA: Diagnosis not present

## 2021-06-30 DIAGNOSIS — J189 Pneumonia, unspecified organism: Secondary | ICD-10-CM | POA: Diagnosis not present

## 2021-06-30 DIAGNOSIS — N3946 Mixed incontinence: Secondary | ICD-10-CM | POA: Diagnosis not present

## 2021-07-05 DIAGNOSIS — G309 Alzheimer's disease, unspecified: Secondary | ICD-10-CM | POA: Diagnosis not present

## 2021-07-05 DIAGNOSIS — J189 Pneumonia, unspecified organism: Secondary | ICD-10-CM | POA: Diagnosis not present

## 2021-07-05 DIAGNOSIS — E039 Hypothyroidism, unspecified: Secondary | ICD-10-CM | POA: Diagnosis not present

## 2021-07-05 DIAGNOSIS — E785 Hyperlipidemia, unspecified: Secondary | ICD-10-CM | POA: Diagnosis not present

## 2021-07-05 DIAGNOSIS — M1990 Unspecified osteoarthritis, unspecified site: Secondary | ICD-10-CM | POA: Diagnosis not present

## 2021-07-05 DIAGNOSIS — N3946 Mixed incontinence: Secondary | ICD-10-CM | POA: Diagnosis not present

## 2021-07-07 DIAGNOSIS — J189 Pneumonia, unspecified organism: Secondary | ICD-10-CM | POA: Diagnosis not present

## 2021-07-07 DIAGNOSIS — N3946 Mixed incontinence: Secondary | ICD-10-CM | POA: Diagnosis not present

## 2021-07-07 DIAGNOSIS — G309 Alzheimer's disease, unspecified: Secondary | ICD-10-CM | POA: Diagnosis not present

## 2021-07-07 DIAGNOSIS — M1990 Unspecified osteoarthritis, unspecified site: Secondary | ICD-10-CM | POA: Diagnosis not present

## 2021-07-07 DIAGNOSIS — E039 Hypothyroidism, unspecified: Secondary | ICD-10-CM | POA: Diagnosis not present

## 2021-07-07 DIAGNOSIS — E785 Hyperlipidemia, unspecified: Secondary | ICD-10-CM | POA: Diagnosis not present

## 2021-07-10 DIAGNOSIS — E039 Hypothyroidism, unspecified: Secondary | ICD-10-CM | POA: Diagnosis not present

## 2021-07-10 DIAGNOSIS — N3946 Mixed incontinence: Secondary | ICD-10-CM | POA: Diagnosis not present

## 2021-07-10 DIAGNOSIS — J189 Pneumonia, unspecified organism: Secondary | ICD-10-CM | POA: Diagnosis not present

## 2021-07-10 DIAGNOSIS — G309 Alzheimer's disease, unspecified: Secondary | ICD-10-CM | POA: Diagnosis not present

## 2021-07-10 DIAGNOSIS — M1990 Unspecified osteoarthritis, unspecified site: Secondary | ICD-10-CM | POA: Diagnosis not present

## 2021-07-10 DIAGNOSIS — E785 Hyperlipidemia, unspecified: Secondary | ICD-10-CM | POA: Diagnosis not present

## 2021-07-11 DIAGNOSIS — E785 Hyperlipidemia, unspecified: Secondary | ICD-10-CM | POA: Diagnosis not present

## 2021-07-11 DIAGNOSIS — M1990 Unspecified osteoarthritis, unspecified site: Secondary | ICD-10-CM | POA: Diagnosis not present

## 2021-07-11 DIAGNOSIS — G309 Alzheimer's disease, unspecified: Secondary | ICD-10-CM | POA: Diagnosis not present

## 2021-07-11 DIAGNOSIS — E039 Hypothyroidism, unspecified: Secondary | ICD-10-CM | POA: Diagnosis not present

## 2021-07-11 DIAGNOSIS — J189 Pneumonia, unspecified organism: Secondary | ICD-10-CM | POA: Diagnosis not present

## 2021-07-11 DIAGNOSIS — N3946 Mixed incontinence: Secondary | ICD-10-CM | POA: Diagnosis not present

## 2021-07-12 DIAGNOSIS — M1990 Unspecified osteoarthritis, unspecified site: Secondary | ICD-10-CM | POA: Diagnosis not present

## 2021-07-12 DIAGNOSIS — E039 Hypothyroidism, unspecified: Secondary | ICD-10-CM | POA: Diagnosis not present

## 2021-07-12 DIAGNOSIS — E785 Hyperlipidemia, unspecified: Secondary | ICD-10-CM | POA: Diagnosis not present

## 2021-07-12 DIAGNOSIS — N3946 Mixed incontinence: Secondary | ICD-10-CM | POA: Diagnosis not present

## 2021-07-12 DIAGNOSIS — G309 Alzheimer's disease, unspecified: Secondary | ICD-10-CM | POA: Diagnosis not present

## 2021-07-12 DIAGNOSIS — J189 Pneumonia, unspecified organism: Secondary | ICD-10-CM | POA: Diagnosis not present

## 2021-07-13 DIAGNOSIS — J189 Pneumonia, unspecified organism: Secondary | ICD-10-CM | POA: Diagnosis not present

## 2021-07-13 DIAGNOSIS — N3946 Mixed incontinence: Secondary | ICD-10-CM | POA: Diagnosis not present

## 2021-07-13 DIAGNOSIS — G309 Alzheimer's disease, unspecified: Secondary | ICD-10-CM | POA: Diagnosis not present

## 2021-07-13 DIAGNOSIS — E785 Hyperlipidemia, unspecified: Secondary | ICD-10-CM | POA: Diagnosis not present

## 2021-07-13 DIAGNOSIS — M1990 Unspecified osteoarthritis, unspecified site: Secondary | ICD-10-CM | POA: Diagnosis not present

## 2021-07-13 DIAGNOSIS — E039 Hypothyroidism, unspecified: Secondary | ICD-10-CM | POA: Diagnosis not present

## 2021-07-14 DIAGNOSIS — E785 Hyperlipidemia, unspecified: Secondary | ICD-10-CM | POA: Diagnosis not present

## 2021-07-14 DIAGNOSIS — N3946 Mixed incontinence: Secondary | ICD-10-CM | POA: Diagnosis not present

## 2021-07-14 DIAGNOSIS — J189 Pneumonia, unspecified organism: Secondary | ICD-10-CM | POA: Diagnosis not present

## 2021-07-14 DIAGNOSIS — E039 Hypothyroidism, unspecified: Secondary | ICD-10-CM | POA: Diagnosis not present

## 2021-07-14 DIAGNOSIS — M1990 Unspecified osteoarthritis, unspecified site: Secondary | ICD-10-CM | POA: Diagnosis not present

## 2021-07-14 DIAGNOSIS — G309 Alzheimer's disease, unspecified: Secondary | ICD-10-CM | POA: Diagnosis not present

## 2021-07-17 DIAGNOSIS — N3946 Mixed incontinence: Secondary | ICD-10-CM | POA: Diagnosis not present

## 2021-07-17 DIAGNOSIS — E039 Hypothyroidism, unspecified: Secondary | ICD-10-CM | POA: Diagnosis not present

## 2021-07-17 DIAGNOSIS — M1990 Unspecified osteoarthritis, unspecified site: Secondary | ICD-10-CM | POA: Diagnosis not present

## 2021-07-17 DIAGNOSIS — J189 Pneumonia, unspecified organism: Secondary | ICD-10-CM | POA: Diagnosis not present

## 2021-07-17 DIAGNOSIS — G309 Alzheimer's disease, unspecified: Secondary | ICD-10-CM | POA: Diagnosis not present

## 2021-07-17 DIAGNOSIS — E785 Hyperlipidemia, unspecified: Secondary | ICD-10-CM | POA: Diagnosis not present

## 2021-07-18 DIAGNOSIS — N3946 Mixed incontinence: Secondary | ICD-10-CM | POA: Diagnosis not present

## 2021-07-18 DIAGNOSIS — G309 Alzheimer's disease, unspecified: Secondary | ICD-10-CM | POA: Diagnosis not present

## 2021-07-18 DIAGNOSIS — E039 Hypothyroidism, unspecified: Secondary | ICD-10-CM | POA: Diagnosis not present

## 2021-07-18 DIAGNOSIS — M1990 Unspecified osteoarthritis, unspecified site: Secondary | ICD-10-CM | POA: Diagnosis not present

## 2021-07-18 DIAGNOSIS — E785 Hyperlipidemia, unspecified: Secondary | ICD-10-CM | POA: Diagnosis not present

## 2021-07-18 DIAGNOSIS — J189 Pneumonia, unspecified organism: Secondary | ICD-10-CM | POA: Diagnosis not present

## 2021-07-19 DIAGNOSIS — J189 Pneumonia, unspecified organism: Secondary | ICD-10-CM | POA: Diagnosis not present

## 2021-07-19 DIAGNOSIS — E039 Hypothyroidism, unspecified: Secondary | ICD-10-CM | POA: Diagnosis not present

## 2021-07-19 DIAGNOSIS — N3946 Mixed incontinence: Secondary | ICD-10-CM | POA: Diagnosis not present

## 2021-07-19 DIAGNOSIS — E785 Hyperlipidemia, unspecified: Secondary | ICD-10-CM | POA: Diagnosis not present

## 2021-07-19 DIAGNOSIS — G309 Alzheimer's disease, unspecified: Secondary | ICD-10-CM | POA: Diagnosis not present

## 2021-07-19 DIAGNOSIS — M1990 Unspecified osteoarthritis, unspecified site: Secondary | ICD-10-CM | POA: Diagnosis not present

## 2021-07-20 DIAGNOSIS — E785 Hyperlipidemia, unspecified: Secondary | ICD-10-CM | POA: Diagnosis not present

## 2021-07-20 DIAGNOSIS — G309 Alzheimer's disease, unspecified: Secondary | ICD-10-CM | POA: Diagnosis not present

## 2021-07-20 DIAGNOSIS — M1990 Unspecified osteoarthritis, unspecified site: Secondary | ICD-10-CM | POA: Diagnosis not present

## 2021-07-20 DIAGNOSIS — E039 Hypothyroidism, unspecified: Secondary | ICD-10-CM | POA: Diagnosis not present

## 2021-07-20 DIAGNOSIS — J189 Pneumonia, unspecified organism: Secondary | ICD-10-CM | POA: Diagnosis not present

## 2021-07-20 DIAGNOSIS — N3946 Mixed incontinence: Secondary | ICD-10-CM | POA: Diagnosis not present

## 2021-07-21 DIAGNOSIS — N3946 Mixed incontinence: Secondary | ICD-10-CM | POA: Diagnosis not present

## 2021-07-21 DIAGNOSIS — M1990 Unspecified osteoarthritis, unspecified site: Secondary | ICD-10-CM | POA: Diagnosis not present

## 2021-07-21 DIAGNOSIS — E785 Hyperlipidemia, unspecified: Secondary | ICD-10-CM | POA: Diagnosis not present

## 2021-07-21 DIAGNOSIS — E039 Hypothyroidism, unspecified: Secondary | ICD-10-CM | POA: Diagnosis not present

## 2021-07-21 DIAGNOSIS — J189 Pneumonia, unspecified organism: Secondary | ICD-10-CM | POA: Diagnosis not present

## 2021-07-21 DIAGNOSIS — G309 Alzheimer's disease, unspecified: Secondary | ICD-10-CM | POA: Diagnosis not present

## 2021-07-24 DIAGNOSIS — M1990 Unspecified osteoarthritis, unspecified site: Secondary | ICD-10-CM | POA: Diagnosis not present

## 2021-07-24 DIAGNOSIS — G309 Alzheimer's disease, unspecified: Secondary | ICD-10-CM | POA: Diagnosis not present

## 2021-07-24 DIAGNOSIS — E785 Hyperlipidemia, unspecified: Secondary | ICD-10-CM | POA: Diagnosis not present

## 2021-07-24 DIAGNOSIS — N3946 Mixed incontinence: Secondary | ICD-10-CM | POA: Diagnosis not present

## 2021-07-24 DIAGNOSIS — E039 Hypothyroidism, unspecified: Secondary | ICD-10-CM | POA: Diagnosis not present

## 2021-07-24 DIAGNOSIS — J189 Pneumonia, unspecified organism: Secondary | ICD-10-CM | POA: Diagnosis not present

## 2021-07-25 DIAGNOSIS — E785 Hyperlipidemia, unspecified: Secondary | ICD-10-CM | POA: Diagnosis not present

## 2021-07-25 DIAGNOSIS — G309 Alzheimer's disease, unspecified: Secondary | ICD-10-CM | POA: Diagnosis not present

## 2021-07-25 DIAGNOSIS — M1990 Unspecified osteoarthritis, unspecified site: Secondary | ICD-10-CM | POA: Diagnosis not present

## 2021-07-25 DIAGNOSIS — N3946 Mixed incontinence: Secondary | ICD-10-CM | POA: Diagnosis not present

## 2021-07-25 DIAGNOSIS — J189 Pneumonia, unspecified organism: Secondary | ICD-10-CM | POA: Diagnosis not present

## 2021-07-25 DIAGNOSIS — E039 Hypothyroidism, unspecified: Secondary | ICD-10-CM | POA: Diagnosis not present

## 2021-07-27 DIAGNOSIS — J189 Pneumonia, unspecified organism: Secondary | ICD-10-CM | POA: Diagnosis not present

## 2021-07-27 DIAGNOSIS — E039 Hypothyroidism, unspecified: Secondary | ICD-10-CM | POA: Diagnosis not present

## 2021-07-27 DIAGNOSIS — G309 Alzheimer's disease, unspecified: Secondary | ICD-10-CM | POA: Diagnosis not present

## 2021-07-27 DIAGNOSIS — N3946 Mixed incontinence: Secondary | ICD-10-CM | POA: Diagnosis not present

## 2021-07-27 DIAGNOSIS — M1990 Unspecified osteoarthritis, unspecified site: Secondary | ICD-10-CM | POA: Diagnosis not present

## 2021-07-27 DIAGNOSIS — E785 Hyperlipidemia, unspecified: Secondary | ICD-10-CM | POA: Diagnosis not present

## 2021-07-29 DIAGNOSIS — E785 Hyperlipidemia, unspecified: Secondary | ICD-10-CM | POA: Diagnosis not present

## 2021-07-29 DIAGNOSIS — G309 Alzheimer's disease, unspecified: Secondary | ICD-10-CM | POA: Diagnosis not present

## 2021-07-29 DIAGNOSIS — R0602 Shortness of breath: Secondary | ICD-10-CM | POA: Diagnosis not present

## 2021-07-29 DIAGNOSIS — E039 Hypothyroidism, unspecified: Secondary | ICD-10-CM | POA: Diagnosis not present

## 2021-07-29 DIAGNOSIS — N3946 Mixed incontinence: Secondary | ICD-10-CM | POA: Diagnosis not present

## 2021-07-29 DIAGNOSIS — J189 Pneumonia, unspecified organism: Secondary | ICD-10-CM | POA: Diagnosis not present

## 2021-07-29 DIAGNOSIS — M1990 Unspecified osteoarthritis, unspecified site: Secondary | ICD-10-CM | POA: Diagnosis not present

## 2021-07-29 DIAGNOSIS — R531 Weakness: Secondary | ICD-10-CM | POA: Diagnosis not present

## 2021-07-31 DIAGNOSIS — E039 Hypothyroidism, unspecified: Secondary | ICD-10-CM | POA: Diagnosis not present

## 2021-07-31 DIAGNOSIS — J189 Pneumonia, unspecified organism: Secondary | ICD-10-CM | POA: Diagnosis not present

## 2021-07-31 DIAGNOSIS — N3946 Mixed incontinence: Secondary | ICD-10-CM | POA: Diagnosis not present

## 2021-07-31 DIAGNOSIS — M1990 Unspecified osteoarthritis, unspecified site: Secondary | ICD-10-CM | POA: Diagnosis not present

## 2021-07-31 DIAGNOSIS — G309 Alzheimer's disease, unspecified: Secondary | ICD-10-CM | POA: Diagnosis not present

## 2021-07-31 DIAGNOSIS — E785 Hyperlipidemia, unspecified: Secondary | ICD-10-CM | POA: Diagnosis not present

## 2021-08-01 DIAGNOSIS — N3946 Mixed incontinence: Secondary | ICD-10-CM | POA: Diagnosis not present

## 2021-08-01 DIAGNOSIS — E039 Hypothyroidism, unspecified: Secondary | ICD-10-CM | POA: Diagnosis not present

## 2021-08-01 DIAGNOSIS — J189 Pneumonia, unspecified organism: Secondary | ICD-10-CM | POA: Diagnosis not present

## 2021-08-01 DIAGNOSIS — E785 Hyperlipidemia, unspecified: Secondary | ICD-10-CM | POA: Diagnosis not present

## 2021-08-01 DIAGNOSIS — M1990 Unspecified osteoarthritis, unspecified site: Secondary | ICD-10-CM | POA: Diagnosis not present

## 2021-08-01 DIAGNOSIS — G309 Alzheimer's disease, unspecified: Secondary | ICD-10-CM | POA: Diagnosis not present

## 2021-08-02 DIAGNOSIS — N3946 Mixed incontinence: Secondary | ICD-10-CM | POA: Diagnosis not present

## 2021-08-02 DIAGNOSIS — J189 Pneumonia, unspecified organism: Secondary | ICD-10-CM | POA: Diagnosis not present

## 2021-08-02 DIAGNOSIS — M1990 Unspecified osteoarthritis, unspecified site: Secondary | ICD-10-CM | POA: Diagnosis not present

## 2021-08-02 DIAGNOSIS — E785 Hyperlipidemia, unspecified: Secondary | ICD-10-CM | POA: Diagnosis not present

## 2021-08-02 DIAGNOSIS — G309 Alzheimer's disease, unspecified: Secondary | ICD-10-CM | POA: Diagnosis not present

## 2021-08-02 DIAGNOSIS — E039 Hypothyroidism, unspecified: Secondary | ICD-10-CM | POA: Diagnosis not present

## 2021-08-03 DIAGNOSIS — E785 Hyperlipidemia, unspecified: Secondary | ICD-10-CM | POA: Diagnosis not present

## 2021-08-03 DIAGNOSIS — J189 Pneumonia, unspecified organism: Secondary | ICD-10-CM | POA: Diagnosis not present

## 2021-08-03 DIAGNOSIS — M1990 Unspecified osteoarthritis, unspecified site: Secondary | ICD-10-CM | POA: Diagnosis not present

## 2021-08-03 DIAGNOSIS — G309 Alzheimer's disease, unspecified: Secondary | ICD-10-CM | POA: Diagnosis not present

## 2021-08-03 DIAGNOSIS — E039 Hypothyroidism, unspecified: Secondary | ICD-10-CM | POA: Diagnosis not present

## 2021-08-03 DIAGNOSIS — N3946 Mixed incontinence: Secondary | ICD-10-CM | POA: Diagnosis not present

## 2021-08-04 DIAGNOSIS — M1990 Unspecified osteoarthritis, unspecified site: Secondary | ICD-10-CM | POA: Diagnosis not present

## 2021-08-04 DIAGNOSIS — E039 Hypothyroidism, unspecified: Secondary | ICD-10-CM | POA: Diagnosis not present

## 2021-08-04 DIAGNOSIS — G309 Alzheimer's disease, unspecified: Secondary | ICD-10-CM | POA: Diagnosis not present

## 2021-08-04 DIAGNOSIS — J189 Pneumonia, unspecified organism: Secondary | ICD-10-CM | POA: Diagnosis not present

## 2021-08-04 DIAGNOSIS — N3946 Mixed incontinence: Secondary | ICD-10-CM | POA: Diagnosis not present

## 2021-08-04 DIAGNOSIS — E785 Hyperlipidemia, unspecified: Secondary | ICD-10-CM | POA: Diagnosis not present

## 2021-08-07 DIAGNOSIS — E039 Hypothyroidism, unspecified: Secondary | ICD-10-CM | POA: Diagnosis not present

## 2021-08-07 DIAGNOSIS — M1990 Unspecified osteoarthritis, unspecified site: Secondary | ICD-10-CM | POA: Diagnosis not present

## 2021-08-07 DIAGNOSIS — E785 Hyperlipidemia, unspecified: Secondary | ICD-10-CM | POA: Diagnosis not present

## 2021-08-07 DIAGNOSIS — J189 Pneumonia, unspecified organism: Secondary | ICD-10-CM | POA: Diagnosis not present

## 2021-08-07 DIAGNOSIS — G309 Alzheimer's disease, unspecified: Secondary | ICD-10-CM | POA: Diagnosis not present

## 2021-08-07 DIAGNOSIS — N3946 Mixed incontinence: Secondary | ICD-10-CM | POA: Diagnosis not present

## 2021-08-08 DIAGNOSIS — M1990 Unspecified osteoarthritis, unspecified site: Secondary | ICD-10-CM | POA: Diagnosis not present

## 2021-08-08 DIAGNOSIS — E039 Hypothyroidism, unspecified: Secondary | ICD-10-CM | POA: Diagnosis not present

## 2021-08-08 DIAGNOSIS — N3946 Mixed incontinence: Secondary | ICD-10-CM | POA: Diagnosis not present

## 2021-08-08 DIAGNOSIS — G309 Alzheimer's disease, unspecified: Secondary | ICD-10-CM | POA: Diagnosis not present

## 2021-08-08 DIAGNOSIS — J189 Pneumonia, unspecified organism: Secondary | ICD-10-CM | POA: Diagnosis not present

## 2021-08-08 DIAGNOSIS — E785 Hyperlipidemia, unspecified: Secondary | ICD-10-CM | POA: Diagnosis not present

## 2021-08-09 DIAGNOSIS — M1990 Unspecified osteoarthritis, unspecified site: Secondary | ICD-10-CM | POA: Diagnosis not present

## 2021-08-09 DIAGNOSIS — E039 Hypothyroidism, unspecified: Secondary | ICD-10-CM | POA: Diagnosis not present

## 2021-08-09 DIAGNOSIS — E785 Hyperlipidemia, unspecified: Secondary | ICD-10-CM | POA: Diagnosis not present

## 2021-08-09 DIAGNOSIS — J189 Pneumonia, unspecified organism: Secondary | ICD-10-CM | POA: Diagnosis not present

## 2021-08-09 DIAGNOSIS — G309 Alzheimer's disease, unspecified: Secondary | ICD-10-CM | POA: Diagnosis not present

## 2021-08-09 DIAGNOSIS — N3946 Mixed incontinence: Secondary | ICD-10-CM | POA: Diagnosis not present

## 2021-08-11 DIAGNOSIS — J189 Pneumonia, unspecified organism: Secondary | ICD-10-CM | POA: Diagnosis not present

## 2021-08-11 DIAGNOSIS — N3946 Mixed incontinence: Secondary | ICD-10-CM | POA: Diagnosis not present

## 2021-08-11 DIAGNOSIS — E039 Hypothyroidism, unspecified: Secondary | ICD-10-CM | POA: Diagnosis not present

## 2021-08-11 DIAGNOSIS — E785 Hyperlipidemia, unspecified: Secondary | ICD-10-CM | POA: Diagnosis not present

## 2021-08-11 DIAGNOSIS — M1990 Unspecified osteoarthritis, unspecified site: Secondary | ICD-10-CM | POA: Diagnosis not present

## 2021-08-11 DIAGNOSIS — G309 Alzheimer's disease, unspecified: Secondary | ICD-10-CM | POA: Diagnosis not present

## 2021-08-14 DIAGNOSIS — E785 Hyperlipidemia, unspecified: Secondary | ICD-10-CM | POA: Diagnosis not present

## 2021-08-14 DIAGNOSIS — J189 Pneumonia, unspecified organism: Secondary | ICD-10-CM | POA: Diagnosis not present

## 2021-08-14 DIAGNOSIS — N3946 Mixed incontinence: Secondary | ICD-10-CM | POA: Diagnosis not present

## 2021-08-14 DIAGNOSIS — M1990 Unspecified osteoarthritis, unspecified site: Secondary | ICD-10-CM | POA: Diagnosis not present

## 2021-08-14 DIAGNOSIS — E039 Hypothyroidism, unspecified: Secondary | ICD-10-CM | POA: Diagnosis not present

## 2021-08-14 DIAGNOSIS — G309 Alzheimer's disease, unspecified: Secondary | ICD-10-CM | POA: Diagnosis not present

## 2021-08-15 DIAGNOSIS — N3946 Mixed incontinence: Secondary | ICD-10-CM | POA: Diagnosis not present

## 2021-08-15 DIAGNOSIS — G309 Alzheimer's disease, unspecified: Secondary | ICD-10-CM | POA: Diagnosis not present

## 2021-08-15 DIAGNOSIS — E785 Hyperlipidemia, unspecified: Secondary | ICD-10-CM | POA: Diagnosis not present

## 2021-08-15 DIAGNOSIS — M1990 Unspecified osteoarthritis, unspecified site: Secondary | ICD-10-CM | POA: Diagnosis not present

## 2021-08-15 DIAGNOSIS — E039 Hypothyroidism, unspecified: Secondary | ICD-10-CM | POA: Diagnosis not present

## 2021-08-15 DIAGNOSIS — J189 Pneumonia, unspecified organism: Secondary | ICD-10-CM | POA: Diagnosis not present

## 2021-08-16 DIAGNOSIS — N3946 Mixed incontinence: Secondary | ICD-10-CM | POA: Diagnosis not present

## 2021-08-16 DIAGNOSIS — G309 Alzheimer's disease, unspecified: Secondary | ICD-10-CM | POA: Diagnosis not present

## 2021-08-16 DIAGNOSIS — E785 Hyperlipidemia, unspecified: Secondary | ICD-10-CM | POA: Diagnosis not present

## 2021-08-16 DIAGNOSIS — J189 Pneumonia, unspecified organism: Secondary | ICD-10-CM | POA: Diagnosis not present

## 2021-08-16 DIAGNOSIS — M1990 Unspecified osteoarthritis, unspecified site: Secondary | ICD-10-CM | POA: Diagnosis not present

## 2021-08-16 DIAGNOSIS — E039 Hypothyroidism, unspecified: Secondary | ICD-10-CM | POA: Diagnosis not present

## 2021-08-17 DIAGNOSIS — N3946 Mixed incontinence: Secondary | ICD-10-CM | POA: Diagnosis not present

## 2021-08-17 DIAGNOSIS — J189 Pneumonia, unspecified organism: Secondary | ICD-10-CM | POA: Diagnosis not present

## 2021-08-17 DIAGNOSIS — G309 Alzheimer's disease, unspecified: Secondary | ICD-10-CM | POA: Diagnosis not present

## 2021-08-17 DIAGNOSIS — E785 Hyperlipidemia, unspecified: Secondary | ICD-10-CM | POA: Diagnosis not present

## 2021-08-17 DIAGNOSIS — E039 Hypothyroidism, unspecified: Secondary | ICD-10-CM | POA: Diagnosis not present

## 2021-08-17 DIAGNOSIS — M1990 Unspecified osteoarthritis, unspecified site: Secondary | ICD-10-CM | POA: Diagnosis not present

## 2021-08-18 DIAGNOSIS — J189 Pneumonia, unspecified organism: Secondary | ICD-10-CM | POA: Diagnosis not present

## 2021-08-18 DIAGNOSIS — N3946 Mixed incontinence: Secondary | ICD-10-CM | POA: Diagnosis not present

## 2021-08-18 DIAGNOSIS — E785 Hyperlipidemia, unspecified: Secondary | ICD-10-CM | POA: Diagnosis not present

## 2021-08-18 DIAGNOSIS — G309 Alzheimer's disease, unspecified: Secondary | ICD-10-CM | POA: Diagnosis not present

## 2021-08-18 DIAGNOSIS — M1990 Unspecified osteoarthritis, unspecified site: Secondary | ICD-10-CM | POA: Diagnosis not present

## 2021-08-18 DIAGNOSIS — E039 Hypothyroidism, unspecified: Secondary | ICD-10-CM | POA: Diagnosis not present

## 2021-08-21 DIAGNOSIS — M1990 Unspecified osteoarthritis, unspecified site: Secondary | ICD-10-CM | POA: Diagnosis not present

## 2021-08-21 DIAGNOSIS — G309 Alzheimer's disease, unspecified: Secondary | ICD-10-CM | POA: Diagnosis not present

## 2021-08-21 DIAGNOSIS — E039 Hypothyroidism, unspecified: Secondary | ICD-10-CM | POA: Diagnosis not present

## 2021-08-21 DIAGNOSIS — E785 Hyperlipidemia, unspecified: Secondary | ICD-10-CM | POA: Diagnosis not present

## 2021-08-21 DIAGNOSIS — N3946 Mixed incontinence: Secondary | ICD-10-CM | POA: Diagnosis not present

## 2021-08-21 DIAGNOSIS — J189 Pneumonia, unspecified organism: Secondary | ICD-10-CM | POA: Diagnosis not present

## 2021-08-22 DIAGNOSIS — E785 Hyperlipidemia, unspecified: Secondary | ICD-10-CM | POA: Diagnosis not present

## 2021-08-22 DIAGNOSIS — J189 Pneumonia, unspecified organism: Secondary | ICD-10-CM | POA: Diagnosis not present

## 2021-08-22 DIAGNOSIS — N3946 Mixed incontinence: Secondary | ICD-10-CM | POA: Diagnosis not present

## 2021-08-22 DIAGNOSIS — E039 Hypothyroidism, unspecified: Secondary | ICD-10-CM | POA: Diagnosis not present

## 2021-08-22 DIAGNOSIS — M1990 Unspecified osteoarthritis, unspecified site: Secondary | ICD-10-CM | POA: Diagnosis not present

## 2021-08-22 DIAGNOSIS — G309 Alzheimer's disease, unspecified: Secondary | ICD-10-CM | POA: Diagnosis not present

## 2021-08-23 DIAGNOSIS — E785 Hyperlipidemia, unspecified: Secondary | ICD-10-CM | POA: Diagnosis not present

## 2021-08-23 DIAGNOSIS — E039 Hypothyroidism, unspecified: Secondary | ICD-10-CM | POA: Diagnosis not present

## 2021-08-23 DIAGNOSIS — J189 Pneumonia, unspecified organism: Secondary | ICD-10-CM | POA: Diagnosis not present

## 2021-08-23 DIAGNOSIS — N3946 Mixed incontinence: Secondary | ICD-10-CM | POA: Diagnosis not present

## 2021-08-23 DIAGNOSIS — G309 Alzheimer's disease, unspecified: Secondary | ICD-10-CM | POA: Diagnosis not present

## 2021-08-23 DIAGNOSIS — M1990 Unspecified osteoarthritis, unspecified site: Secondary | ICD-10-CM | POA: Diagnosis not present

## 2021-08-24 DIAGNOSIS — E039 Hypothyroidism, unspecified: Secondary | ICD-10-CM | POA: Diagnosis not present

## 2021-08-24 DIAGNOSIS — G309 Alzheimer's disease, unspecified: Secondary | ICD-10-CM | POA: Diagnosis not present

## 2021-08-24 DIAGNOSIS — M1990 Unspecified osteoarthritis, unspecified site: Secondary | ICD-10-CM | POA: Diagnosis not present

## 2021-08-24 DIAGNOSIS — E785 Hyperlipidemia, unspecified: Secondary | ICD-10-CM | POA: Diagnosis not present

## 2021-08-24 DIAGNOSIS — N3946 Mixed incontinence: Secondary | ICD-10-CM | POA: Diagnosis not present

## 2021-08-24 DIAGNOSIS — J189 Pneumonia, unspecified organism: Secondary | ICD-10-CM | POA: Diagnosis not present

## 2021-08-25 DIAGNOSIS — M1990 Unspecified osteoarthritis, unspecified site: Secondary | ICD-10-CM | POA: Diagnosis not present

## 2021-08-25 DIAGNOSIS — N3946 Mixed incontinence: Secondary | ICD-10-CM | POA: Diagnosis not present

## 2021-08-25 DIAGNOSIS — J189 Pneumonia, unspecified organism: Secondary | ICD-10-CM | POA: Diagnosis not present

## 2021-08-25 DIAGNOSIS — G309 Alzheimer's disease, unspecified: Secondary | ICD-10-CM | POA: Diagnosis not present

## 2021-08-25 DIAGNOSIS — E785 Hyperlipidemia, unspecified: Secondary | ICD-10-CM | POA: Diagnosis not present

## 2021-08-25 DIAGNOSIS — E039 Hypothyroidism, unspecified: Secondary | ICD-10-CM | POA: Diagnosis not present

## 2021-08-28 DIAGNOSIS — E039 Hypothyroidism, unspecified: Secondary | ICD-10-CM | POA: Diagnosis not present

## 2021-08-28 DIAGNOSIS — J189 Pneumonia, unspecified organism: Secondary | ICD-10-CM | POA: Diagnosis not present

## 2021-08-28 DIAGNOSIS — M1990 Unspecified osteoarthritis, unspecified site: Secondary | ICD-10-CM | POA: Diagnosis not present

## 2021-08-28 DIAGNOSIS — G309 Alzheimer's disease, unspecified: Secondary | ICD-10-CM | POA: Diagnosis not present

## 2021-08-28 DIAGNOSIS — E785 Hyperlipidemia, unspecified: Secondary | ICD-10-CM | POA: Diagnosis not present

## 2021-08-28 DIAGNOSIS — N3946 Mixed incontinence: Secondary | ICD-10-CM | POA: Diagnosis not present

## 2021-08-29 DIAGNOSIS — E039 Hypothyroidism, unspecified: Secondary | ICD-10-CM | POA: Diagnosis not present

## 2021-08-29 DIAGNOSIS — M1990 Unspecified osteoarthritis, unspecified site: Secondary | ICD-10-CM | POA: Diagnosis not present

## 2021-08-29 DIAGNOSIS — N3946 Mixed incontinence: Secondary | ICD-10-CM | POA: Diagnosis not present

## 2021-08-29 DIAGNOSIS — J189 Pneumonia, unspecified organism: Secondary | ICD-10-CM | POA: Diagnosis not present

## 2021-08-29 DIAGNOSIS — G309 Alzheimer's disease, unspecified: Secondary | ICD-10-CM | POA: Diagnosis not present

## 2021-08-29 DIAGNOSIS — E785 Hyperlipidemia, unspecified: Secondary | ICD-10-CM | POA: Diagnosis not present

## 2021-08-29 DIAGNOSIS — R531 Weakness: Secondary | ICD-10-CM | POA: Diagnosis not present

## 2021-08-29 DIAGNOSIS — R0602 Shortness of breath: Secondary | ICD-10-CM | POA: Diagnosis not present

## 2021-08-30 DIAGNOSIS — J189 Pneumonia, unspecified organism: Secondary | ICD-10-CM | POA: Diagnosis not present

## 2021-08-30 DIAGNOSIS — M1990 Unspecified osteoarthritis, unspecified site: Secondary | ICD-10-CM | POA: Diagnosis not present

## 2021-08-30 DIAGNOSIS — E039 Hypothyroidism, unspecified: Secondary | ICD-10-CM | POA: Diagnosis not present

## 2021-08-30 DIAGNOSIS — G309 Alzheimer's disease, unspecified: Secondary | ICD-10-CM | POA: Diagnosis not present

## 2021-08-30 DIAGNOSIS — N3946 Mixed incontinence: Secondary | ICD-10-CM | POA: Diagnosis not present

## 2021-08-30 DIAGNOSIS — E785 Hyperlipidemia, unspecified: Secondary | ICD-10-CM | POA: Diagnosis not present

## 2021-08-31 DIAGNOSIS — G309 Alzheimer's disease, unspecified: Secondary | ICD-10-CM | POA: Diagnosis not present

## 2021-08-31 DIAGNOSIS — N3946 Mixed incontinence: Secondary | ICD-10-CM | POA: Diagnosis not present

## 2021-08-31 DIAGNOSIS — J189 Pneumonia, unspecified organism: Secondary | ICD-10-CM | POA: Diagnosis not present

## 2021-08-31 DIAGNOSIS — E039 Hypothyroidism, unspecified: Secondary | ICD-10-CM | POA: Diagnosis not present

## 2021-08-31 DIAGNOSIS — M1990 Unspecified osteoarthritis, unspecified site: Secondary | ICD-10-CM | POA: Diagnosis not present

## 2021-08-31 DIAGNOSIS — E785 Hyperlipidemia, unspecified: Secondary | ICD-10-CM | POA: Diagnosis not present

## 2021-09-01 DIAGNOSIS — E785 Hyperlipidemia, unspecified: Secondary | ICD-10-CM | POA: Diagnosis not present

## 2021-09-01 DIAGNOSIS — M1990 Unspecified osteoarthritis, unspecified site: Secondary | ICD-10-CM | POA: Diagnosis not present

## 2021-09-01 DIAGNOSIS — N3946 Mixed incontinence: Secondary | ICD-10-CM | POA: Diagnosis not present

## 2021-09-01 DIAGNOSIS — G309 Alzheimer's disease, unspecified: Secondary | ICD-10-CM | POA: Diagnosis not present

## 2021-09-01 DIAGNOSIS — J189 Pneumonia, unspecified organism: Secondary | ICD-10-CM | POA: Diagnosis not present

## 2021-09-01 DIAGNOSIS — E039 Hypothyroidism, unspecified: Secondary | ICD-10-CM | POA: Diagnosis not present

## 2021-09-04 DIAGNOSIS — E039 Hypothyroidism, unspecified: Secondary | ICD-10-CM | POA: Diagnosis not present

## 2021-09-04 DIAGNOSIS — J189 Pneumonia, unspecified organism: Secondary | ICD-10-CM | POA: Diagnosis not present

## 2021-09-04 DIAGNOSIS — N3946 Mixed incontinence: Secondary | ICD-10-CM | POA: Diagnosis not present

## 2021-09-04 DIAGNOSIS — G309 Alzheimer's disease, unspecified: Secondary | ICD-10-CM | POA: Diagnosis not present

## 2021-09-04 DIAGNOSIS — M1990 Unspecified osteoarthritis, unspecified site: Secondary | ICD-10-CM | POA: Diagnosis not present

## 2021-09-04 DIAGNOSIS — E785 Hyperlipidemia, unspecified: Secondary | ICD-10-CM | POA: Diagnosis not present

## 2021-09-05 DIAGNOSIS — E785 Hyperlipidemia, unspecified: Secondary | ICD-10-CM | POA: Diagnosis not present

## 2021-09-05 DIAGNOSIS — M1990 Unspecified osteoarthritis, unspecified site: Secondary | ICD-10-CM | POA: Diagnosis not present

## 2021-09-05 DIAGNOSIS — N3946 Mixed incontinence: Secondary | ICD-10-CM | POA: Diagnosis not present

## 2021-09-05 DIAGNOSIS — G309 Alzheimer's disease, unspecified: Secondary | ICD-10-CM | POA: Diagnosis not present

## 2021-09-05 DIAGNOSIS — E039 Hypothyroidism, unspecified: Secondary | ICD-10-CM | POA: Diagnosis not present

## 2021-09-05 DIAGNOSIS — J189 Pneumonia, unspecified organism: Secondary | ICD-10-CM | POA: Diagnosis not present

## 2021-09-06 DIAGNOSIS — N3946 Mixed incontinence: Secondary | ICD-10-CM | POA: Diagnosis not present

## 2021-09-06 DIAGNOSIS — J189 Pneumonia, unspecified organism: Secondary | ICD-10-CM | POA: Diagnosis not present

## 2021-09-06 DIAGNOSIS — M1990 Unspecified osteoarthritis, unspecified site: Secondary | ICD-10-CM | POA: Diagnosis not present

## 2021-09-06 DIAGNOSIS — E039 Hypothyroidism, unspecified: Secondary | ICD-10-CM | POA: Diagnosis not present

## 2021-09-06 DIAGNOSIS — E785 Hyperlipidemia, unspecified: Secondary | ICD-10-CM | POA: Diagnosis not present

## 2021-09-06 DIAGNOSIS — G309 Alzheimer's disease, unspecified: Secondary | ICD-10-CM | POA: Diagnosis not present

## 2021-09-07 DIAGNOSIS — E785 Hyperlipidemia, unspecified: Secondary | ICD-10-CM | POA: Diagnosis not present

## 2021-09-07 DIAGNOSIS — M1990 Unspecified osteoarthritis, unspecified site: Secondary | ICD-10-CM | POA: Diagnosis not present

## 2021-09-07 DIAGNOSIS — G309 Alzheimer's disease, unspecified: Secondary | ICD-10-CM | POA: Diagnosis not present

## 2021-09-07 DIAGNOSIS — J189 Pneumonia, unspecified organism: Secondary | ICD-10-CM | POA: Diagnosis not present

## 2021-09-07 DIAGNOSIS — E039 Hypothyroidism, unspecified: Secondary | ICD-10-CM | POA: Diagnosis not present

## 2021-09-07 DIAGNOSIS — N3946 Mixed incontinence: Secondary | ICD-10-CM | POA: Diagnosis not present

## 2021-09-08 DIAGNOSIS — E039 Hypothyroidism, unspecified: Secondary | ICD-10-CM | POA: Diagnosis not present

## 2021-09-08 DIAGNOSIS — E785 Hyperlipidemia, unspecified: Secondary | ICD-10-CM | POA: Diagnosis not present

## 2021-09-08 DIAGNOSIS — M1990 Unspecified osteoarthritis, unspecified site: Secondary | ICD-10-CM | POA: Diagnosis not present

## 2021-09-08 DIAGNOSIS — J189 Pneumonia, unspecified organism: Secondary | ICD-10-CM | POA: Diagnosis not present

## 2021-09-08 DIAGNOSIS — N3946 Mixed incontinence: Secondary | ICD-10-CM | POA: Diagnosis not present

## 2021-09-08 DIAGNOSIS — G309 Alzheimer's disease, unspecified: Secondary | ICD-10-CM | POA: Diagnosis not present

## 2021-09-11 DIAGNOSIS — G309 Alzheimer's disease, unspecified: Secondary | ICD-10-CM | POA: Diagnosis not present

## 2021-09-11 DIAGNOSIS — E039 Hypothyroidism, unspecified: Secondary | ICD-10-CM | POA: Diagnosis not present

## 2021-09-11 DIAGNOSIS — E785 Hyperlipidemia, unspecified: Secondary | ICD-10-CM | POA: Diagnosis not present

## 2021-09-11 DIAGNOSIS — N3946 Mixed incontinence: Secondary | ICD-10-CM | POA: Diagnosis not present

## 2021-09-11 DIAGNOSIS — J189 Pneumonia, unspecified organism: Secondary | ICD-10-CM | POA: Diagnosis not present

## 2021-09-11 DIAGNOSIS — M1990 Unspecified osteoarthritis, unspecified site: Secondary | ICD-10-CM | POA: Diagnosis not present

## 2021-09-12 DIAGNOSIS — M1990 Unspecified osteoarthritis, unspecified site: Secondary | ICD-10-CM | POA: Diagnosis not present

## 2021-09-12 DIAGNOSIS — E785 Hyperlipidemia, unspecified: Secondary | ICD-10-CM | POA: Diagnosis not present

## 2021-09-12 DIAGNOSIS — E039 Hypothyroidism, unspecified: Secondary | ICD-10-CM | POA: Diagnosis not present

## 2021-09-12 DIAGNOSIS — J189 Pneumonia, unspecified organism: Secondary | ICD-10-CM | POA: Diagnosis not present

## 2021-09-12 DIAGNOSIS — G309 Alzheimer's disease, unspecified: Secondary | ICD-10-CM | POA: Diagnosis not present

## 2021-09-12 DIAGNOSIS — N3946 Mixed incontinence: Secondary | ICD-10-CM | POA: Diagnosis not present

## 2021-09-13 DIAGNOSIS — G309 Alzheimer's disease, unspecified: Secondary | ICD-10-CM | POA: Diagnosis not present

## 2021-09-13 DIAGNOSIS — N3946 Mixed incontinence: Secondary | ICD-10-CM | POA: Diagnosis not present

## 2021-09-13 DIAGNOSIS — E785 Hyperlipidemia, unspecified: Secondary | ICD-10-CM | POA: Diagnosis not present

## 2021-09-13 DIAGNOSIS — J189 Pneumonia, unspecified organism: Secondary | ICD-10-CM | POA: Diagnosis not present

## 2021-09-13 DIAGNOSIS — M1990 Unspecified osteoarthritis, unspecified site: Secondary | ICD-10-CM | POA: Diagnosis not present

## 2021-09-13 DIAGNOSIS — E039 Hypothyroidism, unspecified: Secondary | ICD-10-CM | POA: Diagnosis not present

## 2021-09-14 DIAGNOSIS — E039 Hypothyroidism, unspecified: Secondary | ICD-10-CM | POA: Diagnosis not present

## 2021-09-14 DIAGNOSIS — N3946 Mixed incontinence: Secondary | ICD-10-CM | POA: Diagnosis not present

## 2021-09-14 DIAGNOSIS — E785 Hyperlipidemia, unspecified: Secondary | ICD-10-CM | POA: Diagnosis not present

## 2021-09-14 DIAGNOSIS — J189 Pneumonia, unspecified organism: Secondary | ICD-10-CM | POA: Diagnosis not present

## 2021-09-14 DIAGNOSIS — G309 Alzheimer's disease, unspecified: Secondary | ICD-10-CM | POA: Diagnosis not present

## 2021-09-14 DIAGNOSIS — M1990 Unspecified osteoarthritis, unspecified site: Secondary | ICD-10-CM | POA: Diagnosis not present

## 2021-09-15 DIAGNOSIS — J189 Pneumonia, unspecified organism: Secondary | ICD-10-CM | POA: Diagnosis not present

## 2021-09-15 DIAGNOSIS — G309 Alzheimer's disease, unspecified: Secondary | ICD-10-CM | POA: Diagnosis not present

## 2021-09-15 DIAGNOSIS — E039 Hypothyroidism, unspecified: Secondary | ICD-10-CM | POA: Diagnosis not present

## 2021-09-15 DIAGNOSIS — M1990 Unspecified osteoarthritis, unspecified site: Secondary | ICD-10-CM | POA: Diagnosis not present

## 2021-09-15 DIAGNOSIS — E785 Hyperlipidemia, unspecified: Secondary | ICD-10-CM | POA: Diagnosis not present

## 2021-09-15 DIAGNOSIS — N3946 Mixed incontinence: Secondary | ICD-10-CM | POA: Diagnosis not present

## 2021-09-18 DIAGNOSIS — E039 Hypothyroidism, unspecified: Secondary | ICD-10-CM | POA: Diagnosis not present

## 2021-09-18 DIAGNOSIS — E785 Hyperlipidemia, unspecified: Secondary | ICD-10-CM | POA: Diagnosis not present

## 2021-09-18 DIAGNOSIS — J189 Pneumonia, unspecified organism: Secondary | ICD-10-CM | POA: Diagnosis not present

## 2021-09-18 DIAGNOSIS — N3946 Mixed incontinence: Secondary | ICD-10-CM | POA: Diagnosis not present

## 2021-09-18 DIAGNOSIS — M1990 Unspecified osteoarthritis, unspecified site: Secondary | ICD-10-CM | POA: Diagnosis not present

## 2021-09-18 DIAGNOSIS — G309 Alzheimer's disease, unspecified: Secondary | ICD-10-CM | POA: Diagnosis not present

## 2021-09-19 DIAGNOSIS — E039 Hypothyroidism, unspecified: Secondary | ICD-10-CM | POA: Diagnosis not present

## 2021-09-19 DIAGNOSIS — N3946 Mixed incontinence: Secondary | ICD-10-CM | POA: Diagnosis not present

## 2021-09-19 DIAGNOSIS — G309 Alzheimer's disease, unspecified: Secondary | ICD-10-CM | POA: Diagnosis not present

## 2021-09-19 DIAGNOSIS — E785 Hyperlipidemia, unspecified: Secondary | ICD-10-CM | POA: Diagnosis not present

## 2021-09-19 DIAGNOSIS — M1990 Unspecified osteoarthritis, unspecified site: Secondary | ICD-10-CM | POA: Diagnosis not present

## 2021-09-19 DIAGNOSIS — J189 Pneumonia, unspecified organism: Secondary | ICD-10-CM | POA: Diagnosis not present

## 2021-09-20 DIAGNOSIS — J189 Pneumonia, unspecified organism: Secondary | ICD-10-CM | POA: Diagnosis not present

## 2021-09-20 DIAGNOSIS — M1990 Unspecified osteoarthritis, unspecified site: Secondary | ICD-10-CM | POA: Diagnosis not present

## 2021-09-20 DIAGNOSIS — E785 Hyperlipidemia, unspecified: Secondary | ICD-10-CM | POA: Diagnosis not present

## 2021-09-20 DIAGNOSIS — N3946 Mixed incontinence: Secondary | ICD-10-CM | POA: Diagnosis not present

## 2021-09-20 DIAGNOSIS — E039 Hypothyroidism, unspecified: Secondary | ICD-10-CM | POA: Diagnosis not present

## 2021-09-20 DIAGNOSIS — G309 Alzheimer's disease, unspecified: Secondary | ICD-10-CM | POA: Diagnosis not present

## 2021-09-22 DIAGNOSIS — E039 Hypothyroidism, unspecified: Secondary | ICD-10-CM | POA: Diagnosis not present

## 2021-09-22 DIAGNOSIS — G309 Alzheimer's disease, unspecified: Secondary | ICD-10-CM | POA: Diagnosis not present

## 2021-09-22 DIAGNOSIS — M1990 Unspecified osteoarthritis, unspecified site: Secondary | ICD-10-CM | POA: Diagnosis not present

## 2021-09-22 DIAGNOSIS — J189 Pneumonia, unspecified organism: Secondary | ICD-10-CM | POA: Diagnosis not present

## 2021-09-22 DIAGNOSIS — N3946 Mixed incontinence: Secondary | ICD-10-CM | POA: Diagnosis not present

## 2021-09-22 DIAGNOSIS — E785 Hyperlipidemia, unspecified: Secondary | ICD-10-CM | POA: Diagnosis not present

## 2021-09-25 DIAGNOSIS — E785 Hyperlipidemia, unspecified: Secondary | ICD-10-CM | POA: Diagnosis not present

## 2021-09-25 DIAGNOSIS — J189 Pneumonia, unspecified organism: Secondary | ICD-10-CM | POA: Diagnosis not present

## 2021-09-25 DIAGNOSIS — M1990 Unspecified osteoarthritis, unspecified site: Secondary | ICD-10-CM | POA: Diagnosis not present

## 2021-09-25 DIAGNOSIS — E039 Hypothyroidism, unspecified: Secondary | ICD-10-CM | POA: Diagnosis not present

## 2021-09-25 DIAGNOSIS — N3946 Mixed incontinence: Secondary | ICD-10-CM | POA: Diagnosis not present

## 2021-09-25 DIAGNOSIS — G309 Alzheimer's disease, unspecified: Secondary | ICD-10-CM | POA: Diagnosis not present

## 2021-09-26 DIAGNOSIS — E785 Hyperlipidemia, unspecified: Secondary | ICD-10-CM | POA: Diagnosis not present

## 2021-09-26 DIAGNOSIS — N3946 Mixed incontinence: Secondary | ICD-10-CM | POA: Diagnosis not present

## 2021-09-26 DIAGNOSIS — E039 Hypothyroidism, unspecified: Secondary | ICD-10-CM | POA: Diagnosis not present

## 2021-09-26 DIAGNOSIS — J189 Pneumonia, unspecified organism: Secondary | ICD-10-CM | POA: Diagnosis not present

## 2021-09-26 DIAGNOSIS — G309 Alzheimer's disease, unspecified: Secondary | ICD-10-CM | POA: Diagnosis not present

## 2021-09-26 DIAGNOSIS — M1990 Unspecified osteoarthritis, unspecified site: Secondary | ICD-10-CM | POA: Diagnosis not present

## 2021-09-27 DIAGNOSIS — E785 Hyperlipidemia, unspecified: Secondary | ICD-10-CM | POA: Diagnosis not present

## 2021-09-27 DIAGNOSIS — J189 Pneumonia, unspecified organism: Secondary | ICD-10-CM | POA: Diagnosis not present

## 2021-09-27 DIAGNOSIS — M1990 Unspecified osteoarthritis, unspecified site: Secondary | ICD-10-CM | POA: Diagnosis not present

## 2021-09-27 DIAGNOSIS — E039 Hypothyroidism, unspecified: Secondary | ICD-10-CM | POA: Diagnosis not present

## 2021-09-27 DIAGNOSIS — N3946 Mixed incontinence: Secondary | ICD-10-CM | POA: Diagnosis not present

## 2021-09-27 DIAGNOSIS — G309 Alzheimer's disease, unspecified: Secondary | ICD-10-CM | POA: Diagnosis not present

## 2021-09-28 DIAGNOSIS — R0602 Shortness of breath: Secondary | ICD-10-CM | POA: Diagnosis not present

## 2021-09-28 DIAGNOSIS — J189 Pneumonia, unspecified organism: Secondary | ICD-10-CM | POA: Diagnosis not present

## 2021-09-28 DIAGNOSIS — E039 Hypothyroidism, unspecified: Secondary | ICD-10-CM | POA: Diagnosis not present

## 2021-09-28 DIAGNOSIS — M1990 Unspecified osteoarthritis, unspecified site: Secondary | ICD-10-CM | POA: Diagnosis not present

## 2021-09-28 DIAGNOSIS — E785 Hyperlipidemia, unspecified: Secondary | ICD-10-CM | POA: Diagnosis not present

## 2021-09-28 DIAGNOSIS — R531 Weakness: Secondary | ICD-10-CM | POA: Diagnosis not present

## 2021-09-28 DIAGNOSIS — N3946 Mixed incontinence: Secondary | ICD-10-CM | POA: Diagnosis not present

## 2021-09-28 DIAGNOSIS — G309 Alzheimer's disease, unspecified: Secondary | ICD-10-CM | POA: Diagnosis not present

## 2021-09-29 DIAGNOSIS — M1990 Unspecified osteoarthritis, unspecified site: Secondary | ICD-10-CM | POA: Diagnosis not present

## 2021-09-29 DIAGNOSIS — N3946 Mixed incontinence: Secondary | ICD-10-CM | POA: Diagnosis not present

## 2021-09-29 DIAGNOSIS — G309 Alzheimer's disease, unspecified: Secondary | ICD-10-CM | POA: Diagnosis not present

## 2021-09-29 DIAGNOSIS — E039 Hypothyroidism, unspecified: Secondary | ICD-10-CM | POA: Diagnosis not present

## 2021-09-29 DIAGNOSIS — E785 Hyperlipidemia, unspecified: Secondary | ICD-10-CM | POA: Diagnosis not present

## 2021-09-29 DIAGNOSIS — J189 Pneumonia, unspecified organism: Secondary | ICD-10-CM | POA: Diagnosis not present

## 2021-10-02 DIAGNOSIS — G309 Alzheimer's disease, unspecified: Secondary | ICD-10-CM | POA: Diagnosis not present

## 2021-10-02 DIAGNOSIS — N3946 Mixed incontinence: Secondary | ICD-10-CM | POA: Diagnosis not present

## 2021-10-02 DIAGNOSIS — E039 Hypothyroidism, unspecified: Secondary | ICD-10-CM | POA: Diagnosis not present

## 2021-10-02 DIAGNOSIS — M1990 Unspecified osteoarthritis, unspecified site: Secondary | ICD-10-CM | POA: Diagnosis not present

## 2021-10-02 DIAGNOSIS — E785 Hyperlipidemia, unspecified: Secondary | ICD-10-CM | POA: Diagnosis not present

## 2021-10-02 DIAGNOSIS — J189 Pneumonia, unspecified organism: Secondary | ICD-10-CM | POA: Diagnosis not present

## 2021-10-03 DIAGNOSIS — G309 Alzheimer's disease, unspecified: Secondary | ICD-10-CM | POA: Diagnosis not present

## 2021-10-03 DIAGNOSIS — E039 Hypothyroidism, unspecified: Secondary | ICD-10-CM | POA: Diagnosis not present

## 2021-10-03 DIAGNOSIS — N3946 Mixed incontinence: Secondary | ICD-10-CM | POA: Diagnosis not present

## 2021-10-03 DIAGNOSIS — J189 Pneumonia, unspecified organism: Secondary | ICD-10-CM | POA: Diagnosis not present

## 2021-10-03 DIAGNOSIS — E785 Hyperlipidemia, unspecified: Secondary | ICD-10-CM | POA: Diagnosis not present

## 2021-10-03 DIAGNOSIS — M1990 Unspecified osteoarthritis, unspecified site: Secondary | ICD-10-CM | POA: Diagnosis not present

## 2021-10-04 DIAGNOSIS — E785 Hyperlipidemia, unspecified: Secondary | ICD-10-CM | POA: Diagnosis not present

## 2021-10-04 DIAGNOSIS — E039 Hypothyroidism, unspecified: Secondary | ICD-10-CM | POA: Diagnosis not present

## 2021-10-04 DIAGNOSIS — N3946 Mixed incontinence: Secondary | ICD-10-CM | POA: Diagnosis not present

## 2021-10-04 DIAGNOSIS — G309 Alzheimer's disease, unspecified: Secondary | ICD-10-CM | POA: Diagnosis not present

## 2021-10-04 DIAGNOSIS — M1990 Unspecified osteoarthritis, unspecified site: Secondary | ICD-10-CM | POA: Diagnosis not present

## 2021-10-04 DIAGNOSIS — J189 Pneumonia, unspecified organism: Secondary | ICD-10-CM | POA: Diagnosis not present

## 2021-10-05 DIAGNOSIS — J189 Pneumonia, unspecified organism: Secondary | ICD-10-CM | POA: Diagnosis not present

## 2021-10-05 DIAGNOSIS — E039 Hypothyroidism, unspecified: Secondary | ICD-10-CM | POA: Diagnosis not present

## 2021-10-05 DIAGNOSIS — E785 Hyperlipidemia, unspecified: Secondary | ICD-10-CM | POA: Diagnosis not present

## 2021-10-05 DIAGNOSIS — M1990 Unspecified osteoarthritis, unspecified site: Secondary | ICD-10-CM | POA: Diagnosis not present

## 2021-10-05 DIAGNOSIS — G309 Alzheimer's disease, unspecified: Secondary | ICD-10-CM | POA: Diagnosis not present

## 2021-10-05 DIAGNOSIS — N3946 Mixed incontinence: Secondary | ICD-10-CM | POA: Diagnosis not present

## 2021-10-06 DIAGNOSIS — M1990 Unspecified osteoarthritis, unspecified site: Secondary | ICD-10-CM | POA: Diagnosis not present

## 2021-10-06 DIAGNOSIS — G309 Alzheimer's disease, unspecified: Secondary | ICD-10-CM | POA: Diagnosis not present

## 2021-10-06 DIAGNOSIS — N3946 Mixed incontinence: Secondary | ICD-10-CM | POA: Diagnosis not present

## 2021-10-06 DIAGNOSIS — E039 Hypothyroidism, unspecified: Secondary | ICD-10-CM | POA: Diagnosis not present

## 2021-10-06 DIAGNOSIS — E785 Hyperlipidemia, unspecified: Secondary | ICD-10-CM | POA: Diagnosis not present

## 2021-10-06 DIAGNOSIS — J189 Pneumonia, unspecified organism: Secondary | ICD-10-CM | POA: Diagnosis not present

## 2021-10-09 DIAGNOSIS — E039 Hypothyroidism, unspecified: Secondary | ICD-10-CM | POA: Diagnosis not present

## 2021-10-09 DIAGNOSIS — J189 Pneumonia, unspecified organism: Secondary | ICD-10-CM | POA: Diagnosis not present

## 2021-10-09 DIAGNOSIS — E785 Hyperlipidemia, unspecified: Secondary | ICD-10-CM | POA: Diagnosis not present

## 2021-10-09 DIAGNOSIS — M1990 Unspecified osteoarthritis, unspecified site: Secondary | ICD-10-CM | POA: Diagnosis not present

## 2021-10-09 DIAGNOSIS — N3946 Mixed incontinence: Secondary | ICD-10-CM | POA: Diagnosis not present

## 2021-10-09 DIAGNOSIS — G309 Alzheimer's disease, unspecified: Secondary | ICD-10-CM | POA: Diagnosis not present

## 2021-10-10 DIAGNOSIS — E785 Hyperlipidemia, unspecified: Secondary | ICD-10-CM | POA: Diagnosis not present

## 2021-10-10 DIAGNOSIS — G309 Alzheimer's disease, unspecified: Secondary | ICD-10-CM | POA: Diagnosis not present

## 2021-10-10 DIAGNOSIS — E039 Hypothyroidism, unspecified: Secondary | ICD-10-CM | POA: Diagnosis not present

## 2021-10-10 DIAGNOSIS — M1990 Unspecified osteoarthritis, unspecified site: Secondary | ICD-10-CM | POA: Diagnosis not present

## 2021-10-10 DIAGNOSIS — N3946 Mixed incontinence: Secondary | ICD-10-CM | POA: Diagnosis not present

## 2021-10-10 DIAGNOSIS — J189 Pneumonia, unspecified organism: Secondary | ICD-10-CM | POA: Diagnosis not present

## 2021-10-11 DIAGNOSIS — E785 Hyperlipidemia, unspecified: Secondary | ICD-10-CM | POA: Diagnosis not present

## 2021-10-11 DIAGNOSIS — J189 Pneumonia, unspecified organism: Secondary | ICD-10-CM | POA: Diagnosis not present

## 2021-10-11 DIAGNOSIS — M1990 Unspecified osteoarthritis, unspecified site: Secondary | ICD-10-CM | POA: Diagnosis not present

## 2021-10-11 DIAGNOSIS — N3946 Mixed incontinence: Secondary | ICD-10-CM | POA: Diagnosis not present

## 2021-10-11 DIAGNOSIS — E039 Hypothyroidism, unspecified: Secondary | ICD-10-CM | POA: Diagnosis not present

## 2021-10-11 DIAGNOSIS — G309 Alzheimer's disease, unspecified: Secondary | ICD-10-CM | POA: Diagnosis not present

## 2021-10-12 DIAGNOSIS — G309 Alzheimer's disease, unspecified: Secondary | ICD-10-CM | POA: Diagnosis not present

## 2021-10-12 DIAGNOSIS — M1990 Unspecified osteoarthritis, unspecified site: Secondary | ICD-10-CM | POA: Diagnosis not present

## 2021-10-12 DIAGNOSIS — E039 Hypothyroidism, unspecified: Secondary | ICD-10-CM | POA: Diagnosis not present

## 2021-10-12 DIAGNOSIS — J189 Pneumonia, unspecified organism: Secondary | ICD-10-CM | POA: Diagnosis not present

## 2021-10-12 DIAGNOSIS — E785 Hyperlipidemia, unspecified: Secondary | ICD-10-CM | POA: Diagnosis not present

## 2021-10-12 DIAGNOSIS — N3946 Mixed incontinence: Secondary | ICD-10-CM | POA: Diagnosis not present

## 2021-10-13 DIAGNOSIS — J189 Pneumonia, unspecified organism: Secondary | ICD-10-CM | POA: Diagnosis not present

## 2021-10-13 DIAGNOSIS — G309 Alzheimer's disease, unspecified: Secondary | ICD-10-CM | POA: Diagnosis not present

## 2021-10-13 DIAGNOSIS — E039 Hypothyroidism, unspecified: Secondary | ICD-10-CM | POA: Diagnosis not present

## 2021-10-13 DIAGNOSIS — M1990 Unspecified osteoarthritis, unspecified site: Secondary | ICD-10-CM | POA: Diagnosis not present

## 2021-10-13 DIAGNOSIS — N3946 Mixed incontinence: Secondary | ICD-10-CM | POA: Diagnosis not present

## 2021-10-13 DIAGNOSIS — E785 Hyperlipidemia, unspecified: Secondary | ICD-10-CM | POA: Diagnosis not present

## 2021-10-16 DIAGNOSIS — M1990 Unspecified osteoarthritis, unspecified site: Secondary | ICD-10-CM | POA: Diagnosis not present

## 2021-10-16 DIAGNOSIS — E039 Hypothyroidism, unspecified: Secondary | ICD-10-CM | POA: Diagnosis not present

## 2021-10-16 DIAGNOSIS — N3946 Mixed incontinence: Secondary | ICD-10-CM | POA: Diagnosis not present

## 2021-10-16 DIAGNOSIS — E785 Hyperlipidemia, unspecified: Secondary | ICD-10-CM | POA: Diagnosis not present

## 2021-10-16 DIAGNOSIS — G309 Alzheimer's disease, unspecified: Secondary | ICD-10-CM | POA: Diagnosis not present

## 2021-10-16 DIAGNOSIS — J189 Pneumonia, unspecified organism: Secondary | ICD-10-CM | POA: Diagnosis not present

## 2021-10-17 DIAGNOSIS — N3946 Mixed incontinence: Secondary | ICD-10-CM | POA: Diagnosis not present

## 2021-10-17 DIAGNOSIS — E039 Hypothyroidism, unspecified: Secondary | ICD-10-CM | POA: Diagnosis not present

## 2021-10-17 DIAGNOSIS — M1990 Unspecified osteoarthritis, unspecified site: Secondary | ICD-10-CM | POA: Diagnosis not present

## 2021-10-17 DIAGNOSIS — G309 Alzheimer's disease, unspecified: Secondary | ICD-10-CM | POA: Diagnosis not present

## 2021-10-17 DIAGNOSIS — E785 Hyperlipidemia, unspecified: Secondary | ICD-10-CM | POA: Diagnosis not present

## 2021-10-17 DIAGNOSIS — J189 Pneumonia, unspecified organism: Secondary | ICD-10-CM | POA: Diagnosis not present

## 2021-10-18 DIAGNOSIS — N3946 Mixed incontinence: Secondary | ICD-10-CM | POA: Diagnosis not present

## 2021-10-18 DIAGNOSIS — E039 Hypothyroidism, unspecified: Secondary | ICD-10-CM | POA: Diagnosis not present

## 2021-10-18 DIAGNOSIS — J189 Pneumonia, unspecified organism: Secondary | ICD-10-CM | POA: Diagnosis not present

## 2021-10-18 DIAGNOSIS — M1990 Unspecified osteoarthritis, unspecified site: Secondary | ICD-10-CM | POA: Diagnosis not present

## 2021-10-18 DIAGNOSIS — G309 Alzheimer's disease, unspecified: Secondary | ICD-10-CM | POA: Diagnosis not present

## 2021-10-18 DIAGNOSIS — E785 Hyperlipidemia, unspecified: Secondary | ICD-10-CM | POA: Diagnosis not present

## 2021-10-19 DIAGNOSIS — M1990 Unspecified osteoarthritis, unspecified site: Secondary | ICD-10-CM | POA: Diagnosis not present

## 2021-10-19 DIAGNOSIS — N3946 Mixed incontinence: Secondary | ICD-10-CM | POA: Diagnosis not present

## 2021-10-19 DIAGNOSIS — E785 Hyperlipidemia, unspecified: Secondary | ICD-10-CM | POA: Diagnosis not present

## 2021-10-19 DIAGNOSIS — G309 Alzheimer's disease, unspecified: Secondary | ICD-10-CM | POA: Diagnosis not present

## 2021-10-19 DIAGNOSIS — J189 Pneumonia, unspecified organism: Secondary | ICD-10-CM | POA: Diagnosis not present

## 2021-10-19 DIAGNOSIS — E039 Hypothyroidism, unspecified: Secondary | ICD-10-CM | POA: Diagnosis not present

## 2021-10-24 DIAGNOSIS — J189 Pneumonia, unspecified organism: Secondary | ICD-10-CM | POA: Diagnosis not present

## 2021-10-24 DIAGNOSIS — G309 Alzheimer's disease, unspecified: Secondary | ICD-10-CM | POA: Diagnosis not present

## 2021-10-24 DIAGNOSIS — M1990 Unspecified osteoarthritis, unspecified site: Secondary | ICD-10-CM | POA: Diagnosis not present

## 2021-10-24 DIAGNOSIS — E039 Hypothyroidism, unspecified: Secondary | ICD-10-CM | POA: Diagnosis not present

## 2021-10-24 DIAGNOSIS — E785 Hyperlipidemia, unspecified: Secondary | ICD-10-CM | POA: Diagnosis not present

## 2021-10-24 DIAGNOSIS — N3946 Mixed incontinence: Secondary | ICD-10-CM | POA: Diagnosis not present

## 2021-10-25 DIAGNOSIS — E039 Hypothyroidism, unspecified: Secondary | ICD-10-CM | POA: Diagnosis not present

## 2021-10-25 DIAGNOSIS — M1990 Unspecified osteoarthritis, unspecified site: Secondary | ICD-10-CM | POA: Diagnosis not present

## 2021-10-25 DIAGNOSIS — N3946 Mixed incontinence: Secondary | ICD-10-CM | POA: Diagnosis not present

## 2021-10-25 DIAGNOSIS — G309 Alzheimer's disease, unspecified: Secondary | ICD-10-CM | POA: Diagnosis not present

## 2021-10-25 DIAGNOSIS — E785 Hyperlipidemia, unspecified: Secondary | ICD-10-CM | POA: Diagnosis not present

## 2021-10-25 DIAGNOSIS — J189 Pneumonia, unspecified organism: Secondary | ICD-10-CM | POA: Diagnosis not present

## 2021-10-26 DIAGNOSIS — G309 Alzheimer's disease, unspecified: Secondary | ICD-10-CM | POA: Diagnosis not present

## 2021-10-26 DIAGNOSIS — E039 Hypothyroidism, unspecified: Secondary | ICD-10-CM | POA: Diagnosis not present

## 2021-10-26 DIAGNOSIS — J189 Pneumonia, unspecified organism: Secondary | ICD-10-CM | POA: Diagnosis not present

## 2021-10-26 DIAGNOSIS — M1990 Unspecified osteoarthritis, unspecified site: Secondary | ICD-10-CM | POA: Diagnosis not present

## 2021-10-26 DIAGNOSIS — N3946 Mixed incontinence: Secondary | ICD-10-CM | POA: Diagnosis not present

## 2021-10-26 DIAGNOSIS — E785 Hyperlipidemia, unspecified: Secondary | ICD-10-CM | POA: Diagnosis not present

## 2021-10-29 DIAGNOSIS — R0602 Shortness of breath: Secondary | ICD-10-CM | POA: Diagnosis not present

## 2021-10-29 DIAGNOSIS — E039 Hypothyroidism, unspecified: Secondary | ICD-10-CM | POA: Diagnosis not present

## 2021-10-29 DIAGNOSIS — M1990 Unspecified osteoarthritis, unspecified site: Secondary | ICD-10-CM | POA: Diagnosis not present

## 2021-10-29 DIAGNOSIS — J189 Pneumonia, unspecified organism: Secondary | ICD-10-CM | POA: Diagnosis not present

## 2021-10-29 DIAGNOSIS — R531 Weakness: Secondary | ICD-10-CM | POA: Diagnosis not present

## 2021-10-29 DIAGNOSIS — G309 Alzheimer's disease, unspecified: Secondary | ICD-10-CM | POA: Diagnosis not present

## 2021-10-29 DIAGNOSIS — N3946 Mixed incontinence: Secondary | ICD-10-CM | POA: Diagnosis not present

## 2021-10-29 DIAGNOSIS — R4182 Altered mental status, unspecified: Secondary | ICD-10-CM | POA: Diagnosis not present

## 2021-10-29 DIAGNOSIS — K5900 Constipation, unspecified: Secondary | ICD-10-CM | POA: Diagnosis not present

## 2021-10-29 DIAGNOSIS — F419 Anxiety disorder, unspecified: Secondary | ICD-10-CM | POA: Diagnosis not present

## 2021-10-29 DIAGNOSIS — E785 Hyperlipidemia, unspecified: Secondary | ICD-10-CM | POA: Diagnosis not present

## 2021-10-31 DIAGNOSIS — N3946 Mixed incontinence: Secondary | ICD-10-CM | POA: Diagnosis not present

## 2021-10-31 DIAGNOSIS — E039 Hypothyroidism, unspecified: Secondary | ICD-10-CM | POA: Diagnosis not present

## 2021-10-31 DIAGNOSIS — G309 Alzheimer's disease, unspecified: Secondary | ICD-10-CM | POA: Diagnosis not present

## 2021-10-31 DIAGNOSIS — M1990 Unspecified osteoarthritis, unspecified site: Secondary | ICD-10-CM | POA: Diagnosis not present

## 2021-10-31 DIAGNOSIS — E785 Hyperlipidemia, unspecified: Secondary | ICD-10-CM | POA: Diagnosis not present

## 2021-10-31 DIAGNOSIS — J189 Pneumonia, unspecified organism: Secondary | ICD-10-CM | POA: Diagnosis not present

## 2021-11-01 DIAGNOSIS — G309 Alzheimer's disease, unspecified: Secondary | ICD-10-CM | POA: Diagnosis not present

## 2021-11-01 DIAGNOSIS — M1990 Unspecified osteoarthritis, unspecified site: Secondary | ICD-10-CM | POA: Diagnosis not present

## 2021-11-01 DIAGNOSIS — J189 Pneumonia, unspecified organism: Secondary | ICD-10-CM | POA: Diagnosis not present

## 2021-11-01 DIAGNOSIS — E039 Hypothyroidism, unspecified: Secondary | ICD-10-CM | POA: Diagnosis not present

## 2021-11-01 DIAGNOSIS — N3946 Mixed incontinence: Secondary | ICD-10-CM | POA: Diagnosis not present

## 2021-11-01 DIAGNOSIS — E785 Hyperlipidemia, unspecified: Secondary | ICD-10-CM | POA: Diagnosis not present

## 2021-11-02 DIAGNOSIS — N3946 Mixed incontinence: Secondary | ICD-10-CM | POA: Diagnosis not present

## 2021-11-02 DIAGNOSIS — M1990 Unspecified osteoarthritis, unspecified site: Secondary | ICD-10-CM | POA: Diagnosis not present

## 2021-11-02 DIAGNOSIS — G309 Alzheimer's disease, unspecified: Secondary | ICD-10-CM | POA: Diagnosis not present

## 2021-11-02 DIAGNOSIS — J189 Pneumonia, unspecified organism: Secondary | ICD-10-CM | POA: Diagnosis not present

## 2021-11-02 DIAGNOSIS — E039 Hypothyroidism, unspecified: Secondary | ICD-10-CM | POA: Diagnosis not present

## 2021-11-02 DIAGNOSIS — E785 Hyperlipidemia, unspecified: Secondary | ICD-10-CM | POA: Diagnosis not present

## 2021-11-03 DIAGNOSIS — E039 Hypothyroidism, unspecified: Secondary | ICD-10-CM | POA: Diagnosis not present

## 2021-11-03 DIAGNOSIS — N3946 Mixed incontinence: Secondary | ICD-10-CM | POA: Diagnosis not present

## 2021-11-03 DIAGNOSIS — J189 Pneumonia, unspecified organism: Secondary | ICD-10-CM | POA: Diagnosis not present

## 2021-11-03 DIAGNOSIS — E785 Hyperlipidemia, unspecified: Secondary | ICD-10-CM | POA: Diagnosis not present

## 2021-11-03 DIAGNOSIS — G309 Alzheimer's disease, unspecified: Secondary | ICD-10-CM | POA: Diagnosis not present

## 2021-11-03 DIAGNOSIS — M1990 Unspecified osteoarthritis, unspecified site: Secondary | ICD-10-CM | POA: Diagnosis not present

## 2021-11-06 DIAGNOSIS — E039 Hypothyroidism, unspecified: Secondary | ICD-10-CM | POA: Diagnosis not present

## 2021-11-06 DIAGNOSIS — M1990 Unspecified osteoarthritis, unspecified site: Secondary | ICD-10-CM | POA: Diagnosis not present

## 2021-11-06 DIAGNOSIS — G309 Alzheimer's disease, unspecified: Secondary | ICD-10-CM | POA: Diagnosis not present

## 2021-11-06 DIAGNOSIS — E785 Hyperlipidemia, unspecified: Secondary | ICD-10-CM | POA: Diagnosis not present

## 2021-11-06 DIAGNOSIS — J189 Pneumonia, unspecified organism: Secondary | ICD-10-CM | POA: Diagnosis not present

## 2021-11-06 DIAGNOSIS — N3946 Mixed incontinence: Secondary | ICD-10-CM | POA: Diagnosis not present

## 2021-11-07 DIAGNOSIS — E785 Hyperlipidemia, unspecified: Secondary | ICD-10-CM | POA: Diagnosis not present

## 2021-11-07 DIAGNOSIS — E039 Hypothyroidism, unspecified: Secondary | ICD-10-CM | POA: Diagnosis not present

## 2021-11-07 DIAGNOSIS — N3946 Mixed incontinence: Secondary | ICD-10-CM | POA: Diagnosis not present

## 2021-11-07 DIAGNOSIS — M1990 Unspecified osteoarthritis, unspecified site: Secondary | ICD-10-CM | POA: Diagnosis not present

## 2021-11-07 DIAGNOSIS — J189 Pneumonia, unspecified organism: Secondary | ICD-10-CM | POA: Diagnosis not present

## 2021-11-07 DIAGNOSIS — G309 Alzheimer's disease, unspecified: Secondary | ICD-10-CM | POA: Diagnosis not present

## 2021-11-09 DIAGNOSIS — J189 Pneumonia, unspecified organism: Secondary | ICD-10-CM | POA: Diagnosis not present

## 2021-11-09 DIAGNOSIS — M1990 Unspecified osteoarthritis, unspecified site: Secondary | ICD-10-CM | POA: Diagnosis not present

## 2021-11-09 DIAGNOSIS — E785 Hyperlipidemia, unspecified: Secondary | ICD-10-CM | POA: Diagnosis not present

## 2021-11-09 DIAGNOSIS — G309 Alzheimer's disease, unspecified: Secondary | ICD-10-CM | POA: Diagnosis not present

## 2021-11-09 DIAGNOSIS — N3946 Mixed incontinence: Secondary | ICD-10-CM | POA: Diagnosis not present

## 2021-11-09 DIAGNOSIS — E039 Hypothyroidism, unspecified: Secondary | ICD-10-CM | POA: Diagnosis not present

## 2021-11-10 DIAGNOSIS — N3946 Mixed incontinence: Secondary | ICD-10-CM | POA: Diagnosis not present

## 2021-11-10 DIAGNOSIS — G309 Alzheimer's disease, unspecified: Secondary | ICD-10-CM | POA: Diagnosis not present

## 2021-11-10 DIAGNOSIS — M1990 Unspecified osteoarthritis, unspecified site: Secondary | ICD-10-CM | POA: Diagnosis not present

## 2021-11-10 DIAGNOSIS — E785 Hyperlipidemia, unspecified: Secondary | ICD-10-CM | POA: Diagnosis not present

## 2021-11-10 DIAGNOSIS — E039 Hypothyroidism, unspecified: Secondary | ICD-10-CM | POA: Diagnosis not present

## 2021-11-10 DIAGNOSIS — J189 Pneumonia, unspecified organism: Secondary | ICD-10-CM | POA: Diagnosis not present

## 2021-11-13 DIAGNOSIS — G309 Alzheimer's disease, unspecified: Secondary | ICD-10-CM | POA: Diagnosis not present

## 2021-11-13 DIAGNOSIS — E785 Hyperlipidemia, unspecified: Secondary | ICD-10-CM | POA: Diagnosis not present

## 2021-11-13 DIAGNOSIS — N3946 Mixed incontinence: Secondary | ICD-10-CM | POA: Diagnosis not present

## 2021-11-13 DIAGNOSIS — J189 Pneumonia, unspecified organism: Secondary | ICD-10-CM | POA: Diagnosis not present

## 2021-11-13 DIAGNOSIS — M1990 Unspecified osteoarthritis, unspecified site: Secondary | ICD-10-CM | POA: Diagnosis not present

## 2021-11-13 DIAGNOSIS — E039 Hypothyroidism, unspecified: Secondary | ICD-10-CM | POA: Diagnosis not present

## 2021-11-14 DIAGNOSIS — E785 Hyperlipidemia, unspecified: Secondary | ICD-10-CM | POA: Diagnosis not present

## 2021-11-14 DIAGNOSIS — N3946 Mixed incontinence: Secondary | ICD-10-CM | POA: Diagnosis not present

## 2021-11-14 DIAGNOSIS — J189 Pneumonia, unspecified organism: Secondary | ICD-10-CM | POA: Diagnosis not present

## 2021-11-14 DIAGNOSIS — M1990 Unspecified osteoarthritis, unspecified site: Secondary | ICD-10-CM | POA: Diagnosis not present

## 2021-11-14 DIAGNOSIS — G309 Alzheimer's disease, unspecified: Secondary | ICD-10-CM | POA: Diagnosis not present

## 2021-11-14 DIAGNOSIS — E039 Hypothyroidism, unspecified: Secondary | ICD-10-CM | POA: Diagnosis not present

## 2021-11-15 DIAGNOSIS — N3946 Mixed incontinence: Secondary | ICD-10-CM | POA: Diagnosis not present

## 2021-11-15 DIAGNOSIS — M1990 Unspecified osteoarthritis, unspecified site: Secondary | ICD-10-CM | POA: Diagnosis not present

## 2021-11-15 DIAGNOSIS — J189 Pneumonia, unspecified organism: Secondary | ICD-10-CM | POA: Diagnosis not present

## 2021-11-15 DIAGNOSIS — E785 Hyperlipidemia, unspecified: Secondary | ICD-10-CM | POA: Diagnosis not present

## 2021-11-15 DIAGNOSIS — G309 Alzheimer's disease, unspecified: Secondary | ICD-10-CM | POA: Diagnosis not present

## 2021-11-15 DIAGNOSIS — E039 Hypothyroidism, unspecified: Secondary | ICD-10-CM | POA: Diagnosis not present

## 2021-11-16 DIAGNOSIS — M1990 Unspecified osteoarthritis, unspecified site: Secondary | ICD-10-CM | POA: Diagnosis not present

## 2021-11-16 DIAGNOSIS — G309 Alzheimer's disease, unspecified: Secondary | ICD-10-CM | POA: Diagnosis not present

## 2021-11-16 DIAGNOSIS — N3946 Mixed incontinence: Secondary | ICD-10-CM | POA: Diagnosis not present

## 2021-11-16 DIAGNOSIS — J189 Pneumonia, unspecified organism: Secondary | ICD-10-CM | POA: Diagnosis not present

## 2021-11-16 DIAGNOSIS — E785 Hyperlipidemia, unspecified: Secondary | ICD-10-CM | POA: Diagnosis not present

## 2021-11-16 DIAGNOSIS — E039 Hypothyroidism, unspecified: Secondary | ICD-10-CM | POA: Diagnosis not present

## 2021-11-17 DIAGNOSIS — E785 Hyperlipidemia, unspecified: Secondary | ICD-10-CM | POA: Diagnosis not present

## 2021-11-17 DIAGNOSIS — M1990 Unspecified osteoarthritis, unspecified site: Secondary | ICD-10-CM | POA: Diagnosis not present

## 2021-11-17 DIAGNOSIS — N3946 Mixed incontinence: Secondary | ICD-10-CM | POA: Diagnosis not present

## 2021-11-17 DIAGNOSIS — G309 Alzheimer's disease, unspecified: Secondary | ICD-10-CM | POA: Diagnosis not present

## 2021-11-17 DIAGNOSIS — J189 Pneumonia, unspecified organism: Secondary | ICD-10-CM | POA: Diagnosis not present

## 2021-11-17 DIAGNOSIS — E039 Hypothyroidism, unspecified: Secondary | ICD-10-CM | POA: Diagnosis not present

## 2021-11-20 DIAGNOSIS — J189 Pneumonia, unspecified organism: Secondary | ICD-10-CM | POA: Diagnosis not present

## 2021-11-20 DIAGNOSIS — E785 Hyperlipidemia, unspecified: Secondary | ICD-10-CM | POA: Diagnosis not present

## 2021-11-20 DIAGNOSIS — G309 Alzheimer's disease, unspecified: Secondary | ICD-10-CM | POA: Diagnosis not present

## 2021-11-20 DIAGNOSIS — N3946 Mixed incontinence: Secondary | ICD-10-CM | POA: Diagnosis not present

## 2021-11-20 DIAGNOSIS — M1990 Unspecified osteoarthritis, unspecified site: Secondary | ICD-10-CM | POA: Diagnosis not present

## 2021-11-20 DIAGNOSIS — E039 Hypothyroidism, unspecified: Secondary | ICD-10-CM | POA: Diagnosis not present

## 2021-11-21 DIAGNOSIS — J189 Pneumonia, unspecified organism: Secondary | ICD-10-CM | POA: Diagnosis not present

## 2021-11-21 DIAGNOSIS — G309 Alzheimer's disease, unspecified: Secondary | ICD-10-CM | POA: Diagnosis not present

## 2021-11-21 DIAGNOSIS — E785 Hyperlipidemia, unspecified: Secondary | ICD-10-CM | POA: Diagnosis not present

## 2021-11-21 DIAGNOSIS — M1990 Unspecified osteoarthritis, unspecified site: Secondary | ICD-10-CM | POA: Diagnosis not present

## 2021-11-21 DIAGNOSIS — E039 Hypothyroidism, unspecified: Secondary | ICD-10-CM | POA: Diagnosis not present

## 2021-11-21 DIAGNOSIS — N3946 Mixed incontinence: Secondary | ICD-10-CM | POA: Diagnosis not present

## 2021-11-22 DIAGNOSIS — E039 Hypothyroidism, unspecified: Secondary | ICD-10-CM | POA: Diagnosis not present

## 2021-11-22 DIAGNOSIS — E785 Hyperlipidemia, unspecified: Secondary | ICD-10-CM | POA: Diagnosis not present

## 2021-11-22 DIAGNOSIS — J189 Pneumonia, unspecified organism: Secondary | ICD-10-CM | POA: Diagnosis not present

## 2021-11-22 DIAGNOSIS — N3946 Mixed incontinence: Secondary | ICD-10-CM | POA: Diagnosis not present

## 2021-11-22 DIAGNOSIS — M1990 Unspecified osteoarthritis, unspecified site: Secondary | ICD-10-CM | POA: Diagnosis not present

## 2021-11-22 DIAGNOSIS — G309 Alzheimer's disease, unspecified: Secondary | ICD-10-CM | POA: Diagnosis not present

## 2021-11-23 DIAGNOSIS — G309 Alzheimer's disease, unspecified: Secondary | ICD-10-CM | POA: Diagnosis not present

## 2021-11-23 DIAGNOSIS — E785 Hyperlipidemia, unspecified: Secondary | ICD-10-CM | POA: Diagnosis not present

## 2021-11-23 DIAGNOSIS — E039 Hypothyroidism, unspecified: Secondary | ICD-10-CM | POA: Diagnosis not present

## 2021-11-23 DIAGNOSIS — J189 Pneumonia, unspecified organism: Secondary | ICD-10-CM | POA: Diagnosis not present

## 2021-11-23 DIAGNOSIS — N3946 Mixed incontinence: Secondary | ICD-10-CM | POA: Diagnosis not present

## 2021-11-23 DIAGNOSIS — M1990 Unspecified osteoarthritis, unspecified site: Secondary | ICD-10-CM | POA: Diagnosis not present

## 2021-11-24 DIAGNOSIS — N3946 Mixed incontinence: Secondary | ICD-10-CM | POA: Diagnosis not present

## 2021-11-24 DIAGNOSIS — J189 Pneumonia, unspecified organism: Secondary | ICD-10-CM | POA: Diagnosis not present

## 2021-11-24 DIAGNOSIS — G309 Alzheimer's disease, unspecified: Secondary | ICD-10-CM | POA: Diagnosis not present

## 2021-11-24 DIAGNOSIS — E039 Hypothyroidism, unspecified: Secondary | ICD-10-CM | POA: Diagnosis not present

## 2021-11-24 DIAGNOSIS — M1990 Unspecified osteoarthritis, unspecified site: Secondary | ICD-10-CM | POA: Diagnosis not present

## 2021-11-24 DIAGNOSIS — E785 Hyperlipidemia, unspecified: Secondary | ICD-10-CM | POA: Diagnosis not present

## 2021-11-27 DIAGNOSIS — E785 Hyperlipidemia, unspecified: Secondary | ICD-10-CM | POA: Diagnosis not present

## 2021-11-27 DIAGNOSIS — J189 Pneumonia, unspecified organism: Secondary | ICD-10-CM | POA: Diagnosis not present

## 2021-11-27 DIAGNOSIS — N3946 Mixed incontinence: Secondary | ICD-10-CM | POA: Diagnosis not present

## 2021-11-27 DIAGNOSIS — M1990 Unspecified osteoarthritis, unspecified site: Secondary | ICD-10-CM | POA: Diagnosis not present

## 2021-11-27 DIAGNOSIS — G309 Alzheimer's disease, unspecified: Secondary | ICD-10-CM | POA: Diagnosis not present

## 2021-11-27 DIAGNOSIS — E039 Hypothyroidism, unspecified: Secondary | ICD-10-CM | POA: Diagnosis not present

## 2021-11-28 DIAGNOSIS — E785 Hyperlipidemia, unspecified: Secondary | ICD-10-CM | POA: Diagnosis not present

## 2021-11-28 DIAGNOSIS — M1990 Unspecified osteoarthritis, unspecified site: Secondary | ICD-10-CM | POA: Diagnosis not present

## 2021-11-28 DIAGNOSIS — G309 Alzheimer's disease, unspecified: Secondary | ICD-10-CM | POA: Diagnosis not present

## 2021-11-28 DIAGNOSIS — E039 Hypothyroidism, unspecified: Secondary | ICD-10-CM | POA: Diagnosis not present

## 2021-11-28 DIAGNOSIS — J189 Pneumonia, unspecified organism: Secondary | ICD-10-CM | POA: Diagnosis not present

## 2021-11-28 DIAGNOSIS — N3946 Mixed incontinence: Secondary | ICD-10-CM | POA: Diagnosis not present

## 2021-11-29 DIAGNOSIS — E785 Hyperlipidemia, unspecified: Secondary | ICD-10-CM | POA: Diagnosis not present

## 2021-11-29 DIAGNOSIS — F419 Anxiety disorder, unspecified: Secondary | ICD-10-CM | POA: Diagnosis not present

## 2021-11-29 DIAGNOSIS — R4182 Altered mental status, unspecified: Secondary | ICD-10-CM | POA: Diagnosis not present

## 2021-11-29 DIAGNOSIS — M1990 Unspecified osteoarthritis, unspecified site: Secondary | ICD-10-CM | POA: Diagnosis not present

## 2021-11-29 DIAGNOSIS — R531 Weakness: Secondary | ICD-10-CM | POA: Diagnosis not present

## 2021-11-29 DIAGNOSIS — K5900 Constipation, unspecified: Secondary | ICD-10-CM | POA: Diagnosis not present

## 2021-11-29 DIAGNOSIS — N3946 Mixed incontinence: Secondary | ICD-10-CM | POA: Diagnosis not present

## 2021-11-29 DIAGNOSIS — J189 Pneumonia, unspecified organism: Secondary | ICD-10-CM | POA: Diagnosis not present

## 2021-11-29 DIAGNOSIS — E039 Hypothyroidism, unspecified: Secondary | ICD-10-CM | POA: Diagnosis not present

## 2021-11-29 DIAGNOSIS — R0602 Shortness of breath: Secondary | ICD-10-CM | POA: Diagnosis not present

## 2021-11-29 DIAGNOSIS — G309 Alzheimer's disease, unspecified: Secondary | ICD-10-CM | POA: Diagnosis not present

## 2021-11-30 DIAGNOSIS — N3946 Mixed incontinence: Secondary | ICD-10-CM | POA: Diagnosis not present

## 2021-11-30 DIAGNOSIS — G309 Alzheimer's disease, unspecified: Secondary | ICD-10-CM | POA: Diagnosis not present

## 2021-11-30 DIAGNOSIS — E785 Hyperlipidemia, unspecified: Secondary | ICD-10-CM | POA: Diagnosis not present

## 2021-11-30 DIAGNOSIS — M1990 Unspecified osteoarthritis, unspecified site: Secondary | ICD-10-CM | POA: Diagnosis not present

## 2021-11-30 DIAGNOSIS — E039 Hypothyroidism, unspecified: Secondary | ICD-10-CM | POA: Diagnosis not present

## 2021-11-30 DIAGNOSIS — J189 Pneumonia, unspecified organism: Secondary | ICD-10-CM | POA: Diagnosis not present

## 2021-12-01 DIAGNOSIS — E785 Hyperlipidemia, unspecified: Secondary | ICD-10-CM | POA: Diagnosis not present

## 2021-12-01 DIAGNOSIS — G309 Alzheimer's disease, unspecified: Secondary | ICD-10-CM | POA: Diagnosis not present

## 2021-12-01 DIAGNOSIS — J189 Pneumonia, unspecified organism: Secondary | ICD-10-CM | POA: Diagnosis not present

## 2021-12-01 DIAGNOSIS — E039 Hypothyroidism, unspecified: Secondary | ICD-10-CM | POA: Diagnosis not present

## 2021-12-01 DIAGNOSIS — N3946 Mixed incontinence: Secondary | ICD-10-CM | POA: Diagnosis not present

## 2021-12-01 DIAGNOSIS — M1990 Unspecified osteoarthritis, unspecified site: Secondary | ICD-10-CM | POA: Diagnosis not present

## 2021-12-04 DIAGNOSIS — J189 Pneumonia, unspecified organism: Secondary | ICD-10-CM | POA: Diagnosis not present

## 2021-12-04 DIAGNOSIS — G309 Alzheimer's disease, unspecified: Secondary | ICD-10-CM | POA: Diagnosis not present

## 2021-12-04 DIAGNOSIS — E039 Hypothyroidism, unspecified: Secondary | ICD-10-CM | POA: Diagnosis not present

## 2021-12-04 DIAGNOSIS — M1990 Unspecified osteoarthritis, unspecified site: Secondary | ICD-10-CM | POA: Diagnosis not present

## 2021-12-04 DIAGNOSIS — N3946 Mixed incontinence: Secondary | ICD-10-CM | POA: Diagnosis not present

## 2021-12-04 DIAGNOSIS — E785 Hyperlipidemia, unspecified: Secondary | ICD-10-CM | POA: Diagnosis not present

## 2021-12-05 DIAGNOSIS — J189 Pneumonia, unspecified organism: Secondary | ICD-10-CM | POA: Diagnosis not present

## 2021-12-05 DIAGNOSIS — E039 Hypothyroidism, unspecified: Secondary | ICD-10-CM | POA: Diagnosis not present

## 2021-12-05 DIAGNOSIS — G309 Alzheimer's disease, unspecified: Secondary | ICD-10-CM | POA: Diagnosis not present

## 2021-12-05 DIAGNOSIS — M1990 Unspecified osteoarthritis, unspecified site: Secondary | ICD-10-CM | POA: Diagnosis not present

## 2021-12-05 DIAGNOSIS — N3946 Mixed incontinence: Secondary | ICD-10-CM | POA: Diagnosis not present

## 2021-12-05 DIAGNOSIS — E785 Hyperlipidemia, unspecified: Secondary | ICD-10-CM | POA: Diagnosis not present

## 2021-12-06 DIAGNOSIS — E785 Hyperlipidemia, unspecified: Secondary | ICD-10-CM | POA: Diagnosis not present

## 2021-12-06 DIAGNOSIS — G309 Alzheimer's disease, unspecified: Secondary | ICD-10-CM | POA: Diagnosis not present

## 2021-12-06 DIAGNOSIS — N3946 Mixed incontinence: Secondary | ICD-10-CM | POA: Diagnosis not present

## 2021-12-06 DIAGNOSIS — M1990 Unspecified osteoarthritis, unspecified site: Secondary | ICD-10-CM | POA: Diagnosis not present

## 2021-12-06 DIAGNOSIS — E039 Hypothyroidism, unspecified: Secondary | ICD-10-CM | POA: Diagnosis not present

## 2021-12-06 DIAGNOSIS — J189 Pneumonia, unspecified organism: Secondary | ICD-10-CM | POA: Diagnosis not present

## 2021-12-07 DIAGNOSIS — N3946 Mixed incontinence: Secondary | ICD-10-CM | POA: Diagnosis not present

## 2021-12-07 DIAGNOSIS — J189 Pneumonia, unspecified organism: Secondary | ICD-10-CM | POA: Diagnosis not present

## 2021-12-07 DIAGNOSIS — E785 Hyperlipidemia, unspecified: Secondary | ICD-10-CM | POA: Diagnosis not present

## 2021-12-07 DIAGNOSIS — M1990 Unspecified osteoarthritis, unspecified site: Secondary | ICD-10-CM | POA: Diagnosis not present

## 2021-12-07 DIAGNOSIS — E039 Hypothyroidism, unspecified: Secondary | ICD-10-CM | POA: Diagnosis not present

## 2021-12-07 DIAGNOSIS — G309 Alzheimer's disease, unspecified: Secondary | ICD-10-CM | POA: Diagnosis not present

## 2021-12-11 DIAGNOSIS — E785 Hyperlipidemia, unspecified: Secondary | ICD-10-CM | POA: Diagnosis not present

## 2021-12-11 DIAGNOSIS — N3946 Mixed incontinence: Secondary | ICD-10-CM | POA: Diagnosis not present

## 2021-12-11 DIAGNOSIS — J189 Pneumonia, unspecified organism: Secondary | ICD-10-CM | POA: Diagnosis not present

## 2021-12-11 DIAGNOSIS — M1990 Unspecified osteoarthritis, unspecified site: Secondary | ICD-10-CM | POA: Diagnosis not present

## 2021-12-11 DIAGNOSIS — G309 Alzheimer's disease, unspecified: Secondary | ICD-10-CM | POA: Diagnosis not present

## 2021-12-11 DIAGNOSIS — E039 Hypothyroidism, unspecified: Secondary | ICD-10-CM | POA: Diagnosis not present

## 2021-12-12 DIAGNOSIS — G309 Alzheimer's disease, unspecified: Secondary | ICD-10-CM | POA: Diagnosis not present

## 2021-12-12 DIAGNOSIS — J189 Pneumonia, unspecified organism: Secondary | ICD-10-CM | POA: Diagnosis not present

## 2021-12-12 DIAGNOSIS — M1990 Unspecified osteoarthritis, unspecified site: Secondary | ICD-10-CM | POA: Diagnosis not present

## 2021-12-12 DIAGNOSIS — E785 Hyperlipidemia, unspecified: Secondary | ICD-10-CM | POA: Diagnosis not present

## 2021-12-12 DIAGNOSIS — E039 Hypothyroidism, unspecified: Secondary | ICD-10-CM | POA: Diagnosis not present

## 2021-12-12 DIAGNOSIS — N3946 Mixed incontinence: Secondary | ICD-10-CM | POA: Diagnosis not present

## 2021-12-13 DIAGNOSIS — E039 Hypothyroidism, unspecified: Secondary | ICD-10-CM | POA: Diagnosis not present

## 2021-12-13 DIAGNOSIS — G309 Alzheimer's disease, unspecified: Secondary | ICD-10-CM | POA: Diagnosis not present

## 2021-12-13 DIAGNOSIS — E785 Hyperlipidemia, unspecified: Secondary | ICD-10-CM | POA: Diagnosis not present

## 2021-12-13 DIAGNOSIS — N3946 Mixed incontinence: Secondary | ICD-10-CM | POA: Diagnosis not present

## 2021-12-13 DIAGNOSIS — M1990 Unspecified osteoarthritis, unspecified site: Secondary | ICD-10-CM | POA: Diagnosis not present

## 2021-12-13 DIAGNOSIS — J189 Pneumonia, unspecified organism: Secondary | ICD-10-CM | POA: Diagnosis not present

## 2021-12-14 DIAGNOSIS — E785 Hyperlipidemia, unspecified: Secondary | ICD-10-CM | POA: Diagnosis not present

## 2021-12-14 DIAGNOSIS — N3946 Mixed incontinence: Secondary | ICD-10-CM | POA: Diagnosis not present

## 2021-12-14 DIAGNOSIS — M1990 Unspecified osteoarthritis, unspecified site: Secondary | ICD-10-CM | POA: Diagnosis not present

## 2021-12-14 DIAGNOSIS — J189 Pneumonia, unspecified organism: Secondary | ICD-10-CM | POA: Diagnosis not present

## 2021-12-14 DIAGNOSIS — E039 Hypothyroidism, unspecified: Secondary | ICD-10-CM | POA: Diagnosis not present

## 2021-12-14 DIAGNOSIS — G309 Alzheimer's disease, unspecified: Secondary | ICD-10-CM | POA: Diagnosis not present

## 2021-12-15 DIAGNOSIS — J189 Pneumonia, unspecified organism: Secondary | ICD-10-CM | POA: Diagnosis not present

## 2021-12-15 DIAGNOSIS — N3946 Mixed incontinence: Secondary | ICD-10-CM | POA: Diagnosis not present

## 2021-12-15 DIAGNOSIS — E039 Hypothyroidism, unspecified: Secondary | ICD-10-CM | POA: Diagnosis not present

## 2021-12-15 DIAGNOSIS — M1990 Unspecified osteoarthritis, unspecified site: Secondary | ICD-10-CM | POA: Diagnosis not present

## 2021-12-15 DIAGNOSIS — E785 Hyperlipidemia, unspecified: Secondary | ICD-10-CM | POA: Diagnosis not present

## 2021-12-15 DIAGNOSIS — G309 Alzheimer's disease, unspecified: Secondary | ICD-10-CM | POA: Diagnosis not present

## 2021-12-18 DIAGNOSIS — G309 Alzheimer's disease, unspecified: Secondary | ICD-10-CM | POA: Diagnosis not present

## 2021-12-18 DIAGNOSIS — E039 Hypothyroidism, unspecified: Secondary | ICD-10-CM | POA: Diagnosis not present

## 2021-12-18 DIAGNOSIS — E785 Hyperlipidemia, unspecified: Secondary | ICD-10-CM | POA: Diagnosis not present

## 2021-12-18 DIAGNOSIS — M1990 Unspecified osteoarthritis, unspecified site: Secondary | ICD-10-CM | POA: Diagnosis not present

## 2021-12-18 DIAGNOSIS — N3946 Mixed incontinence: Secondary | ICD-10-CM | POA: Diagnosis not present

## 2021-12-18 DIAGNOSIS — J189 Pneumonia, unspecified organism: Secondary | ICD-10-CM | POA: Diagnosis not present

## 2021-12-19 DIAGNOSIS — E785 Hyperlipidemia, unspecified: Secondary | ICD-10-CM | POA: Diagnosis not present

## 2021-12-19 DIAGNOSIS — G309 Alzheimer's disease, unspecified: Secondary | ICD-10-CM | POA: Diagnosis not present

## 2021-12-19 DIAGNOSIS — E039 Hypothyroidism, unspecified: Secondary | ICD-10-CM | POA: Diagnosis not present

## 2021-12-19 DIAGNOSIS — J189 Pneumonia, unspecified organism: Secondary | ICD-10-CM | POA: Diagnosis not present

## 2021-12-19 DIAGNOSIS — N3946 Mixed incontinence: Secondary | ICD-10-CM | POA: Diagnosis not present

## 2021-12-19 DIAGNOSIS — M1990 Unspecified osteoarthritis, unspecified site: Secondary | ICD-10-CM | POA: Diagnosis not present

## 2021-12-20 DIAGNOSIS — N3946 Mixed incontinence: Secondary | ICD-10-CM | POA: Diagnosis not present

## 2021-12-20 DIAGNOSIS — E785 Hyperlipidemia, unspecified: Secondary | ICD-10-CM | POA: Diagnosis not present

## 2021-12-20 DIAGNOSIS — M1990 Unspecified osteoarthritis, unspecified site: Secondary | ICD-10-CM | POA: Diagnosis not present

## 2021-12-20 DIAGNOSIS — E039 Hypothyroidism, unspecified: Secondary | ICD-10-CM | POA: Diagnosis not present

## 2021-12-20 DIAGNOSIS — J189 Pneumonia, unspecified organism: Secondary | ICD-10-CM | POA: Diagnosis not present

## 2021-12-20 DIAGNOSIS — G309 Alzheimer's disease, unspecified: Secondary | ICD-10-CM | POA: Diagnosis not present

## 2021-12-21 DIAGNOSIS — E039 Hypothyroidism, unspecified: Secondary | ICD-10-CM | POA: Diagnosis not present

## 2021-12-21 DIAGNOSIS — G309 Alzheimer's disease, unspecified: Secondary | ICD-10-CM | POA: Diagnosis not present

## 2021-12-21 DIAGNOSIS — J189 Pneumonia, unspecified organism: Secondary | ICD-10-CM | POA: Diagnosis not present

## 2021-12-21 DIAGNOSIS — M1990 Unspecified osteoarthritis, unspecified site: Secondary | ICD-10-CM | POA: Diagnosis not present

## 2021-12-21 DIAGNOSIS — E785 Hyperlipidemia, unspecified: Secondary | ICD-10-CM | POA: Diagnosis not present

## 2021-12-21 DIAGNOSIS — N3946 Mixed incontinence: Secondary | ICD-10-CM | POA: Diagnosis not present

## 2021-12-22 DIAGNOSIS — N3946 Mixed incontinence: Secondary | ICD-10-CM | POA: Diagnosis not present

## 2021-12-22 DIAGNOSIS — J189 Pneumonia, unspecified organism: Secondary | ICD-10-CM | POA: Diagnosis not present

## 2021-12-22 DIAGNOSIS — E039 Hypothyroidism, unspecified: Secondary | ICD-10-CM | POA: Diagnosis not present

## 2021-12-22 DIAGNOSIS — E785 Hyperlipidemia, unspecified: Secondary | ICD-10-CM | POA: Diagnosis not present

## 2021-12-22 DIAGNOSIS — M1990 Unspecified osteoarthritis, unspecified site: Secondary | ICD-10-CM | POA: Diagnosis not present

## 2021-12-22 DIAGNOSIS — G309 Alzheimer's disease, unspecified: Secondary | ICD-10-CM | POA: Diagnosis not present

## 2021-12-25 DIAGNOSIS — E785 Hyperlipidemia, unspecified: Secondary | ICD-10-CM | POA: Diagnosis not present

## 2021-12-25 DIAGNOSIS — M1990 Unspecified osteoarthritis, unspecified site: Secondary | ICD-10-CM | POA: Diagnosis not present

## 2021-12-25 DIAGNOSIS — E039 Hypothyroidism, unspecified: Secondary | ICD-10-CM | POA: Diagnosis not present

## 2021-12-25 DIAGNOSIS — G309 Alzheimer's disease, unspecified: Secondary | ICD-10-CM | POA: Diagnosis not present

## 2021-12-25 DIAGNOSIS — J189 Pneumonia, unspecified organism: Secondary | ICD-10-CM | POA: Diagnosis not present

## 2021-12-25 DIAGNOSIS — N3946 Mixed incontinence: Secondary | ICD-10-CM | POA: Diagnosis not present

## 2021-12-26 DIAGNOSIS — M1990 Unspecified osteoarthritis, unspecified site: Secondary | ICD-10-CM | POA: Diagnosis not present

## 2021-12-26 DIAGNOSIS — N3946 Mixed incontinence: Secondary | ICD-10-CM | POA: Diagnosis not present

## 2021-12-26 DIAGNOSIS — G309 Alzheimer's disease, unspecified: Secondary | ICD-10-CM | POA: Diagnosis not present

## 2021-12-26 DIAGNOSIS — J189 Pneumonia, unspecified organism: Secondary | ICD-10-CM | POA: Diagnosis not present

## 2021-12-26 DIAGNOSIS — E785 Hyperlipidemia, unspecified: Secondary | ICD-10-CM | POA: Diagnosis not present

## 2021-12-26 DIAGNOSIS — E039 Hypothyroidism, unspecified: Secondary | ICD-10-CM | POA: Diagnosis not present

## 2021-12-27 DIAGNOSIS — F419 Anxiety disorder, unspecified: Secondary | ICD-10-CM | POA: Diagnosis not present

## 2021-12-27 DIAGNOSIS — J189 Pneumonia, unspecified organism: Secondary | ICD-10-CM | POA: Diagnosis not present

## 2021-12-27 DIAGNOSIS — M1990 Unspecified osteoarthritis, unspecified site: Secondary | ICD-10-CM | POA: Diagnosis not present

## 2021-12-27 DIAGNOSIS — K5900 Constipation, unspecified: Secondary | ICD-10-CM | POA: Diagnosis not present

## 2021-12-27 DIAGNOSIS — E785 Hyperlipidemia, unspecified: Secondary | ICD-10-CM | POA: Diagnosis not present

## 2021-12-27 DIAGNOSIS — G309 Alzheimer's disease, unspecified: Secondary | ICD-10-CM | POA: Diagnosis not present

## 2021-12-27 DIAGNOSIS — R4182 Altered mental status, unspecified: Secondary | ICD-10-CM | POA: Diagnosis not present

## 2021-12-27 DIAGNOSIS — N3946 Mixed incontinence: Secondary | ICD-10-CM | POA: Diagnosis not present

## 2021-12-27 DIAGNOSIS — R531 Weakness: Secondary | ICD-10-CM | POA: Diagnosis not present

## 2021-12-27 DIAGNOSIS — R0602 Shortness of breath: Secondary | ICD-10-CM | POA: Diagnosis not present

## 2021-12-27 DIAGNOSIS — E039 Hypothyroidism, unspecified: Secondary | ICD-10-CM | POA: Diagnosis not present

## 2021-12-28 DIAGNOSIS — E039 Hypothyroidism, unspecified: Secondary | ICD-10-CM | POA: Diagnosis not present

## 2021-12-28 DIAGNOSIS — G309 Alzheimer's disease, unspecified: Secondary | ICD-10-CM | POA: Diagnosis not present

## 2021-12-28 DIAGNOSIS — N3946 Mixed incontinence: Secondary | ICD-10-CM | POA: Diagnosis not present

## 2021-12-28 DIAGNOSIS — M1990 Unspecified osteoarthritis, unspecified site: Secondary | ICD-10-CM | POA: Diagnosis not present

## 2021-12-28 DIAGNOSIS — E785 Hyperlipidemia, unspecified: Secondary | ICD-10-CM | POA: Diagnosis not present

## 2021-12-28 DIAGNOSIS — J189 Pneumonia, unspecified organism: Secondary | ICD-10-CM | POA: Diagnosis not present

## 2021-12-29 DIAGNOSIS — E785 Hyperlipidemia, unspecified: Secondary | ICD-10-CM | POA: Diagnosis not present

## 2021-12-29 DIAGNOSIS — M1990 Unspecified osteoarthritis, unspecified site: Secondary | ICD-10-CM | POA: Diagnosis not present

## 2021-12-29 DIAGNOSIS — E039 Hypothyroidism, unspecified: Secondary | ICD-10-CM | POA: Diagnosis not present

## 2021-12-29 DIAGNOSIS — N3946 Mixed incontinence: Secondary | ICD-10-CM | POA: Diagnosis not present

## 2021-12-29 DIAGNOSIS — G309 Alzheimer's disease, unspecified: Secondary | ICD-10-CM | POA: Diagnosis not present

## 2021-12-29 DIAGNOSIS — J189 Pneumonia, unspecified organism: Secondary | ICD-10-CM | POA: Diagnosis not present

## 2022-01-01 DIAGNOSIS — N3946 Mixed incontinence: Secondary | ICD-10-CM | POA: Diagnosis not present

## 2022-01-01 DIAGNOSIS — E785 Hyperlipidemia, unspecified: Secondary | ICD-10-CM | POA: Diagnosis not present

## 2022-01-01 DIAGNOSIS — J189 Pneumonia, unspecified organism: Secondary | ICD-10-CM | POA: Diagnosis not present

## 2022-01-01 DIAGNOSIS — G309 Alzheimer's disease, unspecified: Secondary | ICD-10-CM | POA: Diagnosis not present

## 2022-01-01 DIAGNOSIS — M1990 Unspecified osteoarthritis, unspecified site: Secondary | ICD-10-CM | POA: Diagnosis not present

## 2022-01-01 DIAGNOSIS — E039 Hypothyroidism, unspecified: Secondary | ICD-10-CM | POA: Diagnosis not present

## 2022-01-02 DIAGNOSIS — J189 Pneumonia, unspecified organism: Secondary | ICD-10-CM | POA: Diagnosis not present

## 2022-01-02 DIAGNOSIS — N3946 Mixed incontinence: Secondary | ICD-10-CM | POA: Diagnosis not present

## 2022-01-02 DIAGNOSIS — E039 Hypothyroidism, unspecified: Secondary | ICD-10-CM | POA: Diagnosis not present

## 2022-01-02 DIAGNOSIS — M1990 Unspecified osteoarthritis, unspecified site: Secondary | ICD-10-CM | POA: Diagnosis not present

## 2022-01-02 DIAGNOSIS — E785 Hyperlipidemia, unspecified: Secondary | ICD-10-CM | POA: Diagnosis not present

## 2022-01-02 DIAGNOSIS — G309 Alzheimer's disease, unspecified: Secondary | ICD-10-CM | POA: Diagnosis not present

## 2022-01-03 DIAGNOSIS — E039 Hypothyroidism, unspecified: Secondary | ICD-10-CM | POA: Diagnosis not present

## 2022-01-03 DIAGNOSIS — M1990 Unspecified osteoarthritis, unspecified site: Secondary | ICD-10-CM | POA: Diagnosis not present

## 2022-01-03 DIAGNOSIS — N3946 Mixed incontinence: Secondary | ICD-10-CM | POA: Diagnosis not present

## 2022-01-03 DIAGNOSIS — G309 Alzheimer's disease, unspecified: Secondary | ICD-10-CM | POA: Diagnosis not present

## 2022-01-03 DIAGNOSIS — J189 Pneumonia, unspecified organism: Secondary | ICD-10-CM | POA: Diagnosis not present

## 2022-01-03 DIAGNOSIS — E785 Hyperlipidemia, unspecified: Secondary | ICD-10-CM | POA: Diagnosis not present

## 2022-01-04 DIAGNOSIS — J189 Pneumonia, unspecified organism: Secondary | ICD-10-CM | POA: Diagnosis not present

## 2022-01-04 DIAGNOSIS — N3946 Mixed incontinence: Secondary | ICD-10-CM | POA: Diagnosis not present

## 2022-01-04 DIAGNOSIS — E785 Hyperlipidemia, unspecified: Secondary | ICD-10-CM | POA: Diagnosis not present

## 2022-01-04 DIAGNOSIS — M1990 Unspecified osteoarthritis, unspecified site: Secondary | ICD-10-CM | POA: Diagnosis not present

## 2022-01-04 DIAGNOSIS — G309 Alzheimer's disease, unspecified: Secondary | ICD-10-CM | POA: Diagnosis not present

## 2022-01-04 DIAGNOSIS — E039 Hypothyroidism, unspecified: Secondary | ICD-10-CM | POA: Diagnosis not present

## 2022-01-05 DIAGNOSIS — E039 Hypothyroidism, unspecified: Secondary | ICD-10-CM | POA: Diagnosis not present

## 2022-01-05 DIAGNOSIS — J189 Pneumonia, unspecified organism: Secondary | ICD-10-CM | POA: Diagnosis not present

## 2022-01-05 DIAGNOSIS — N3946 Mixed incontinence: Secondary | ICD-10-CM | POA: Diagnosis not present

## 2022-01-05 DIAGNOSIS — M1990 Unspecified osteoarthritis, unspecified site: Secondary | ICD-10-CM | POA: Diagnosis not present

## 2022-01-05 DIAGNOSIS — G309 Alzheimer's disease, unspecified: Secondary | ICD-10-CM | POA: Diagnosis not present

## 2022-01-05 DIAGNOSIS — E785 Hyperlipidemia, unspecified: Secondary | ICD-10-CM | POA: Diagnosis not present

## 2022-01-08 DIAGNOSIS — E785 Hyperlipidemia, unspecified: Secondary | ICD-10-CM | POA: Diagnosis not present

## 2022-01-08 DIAGNOSIS — N3946 Mixed incontinence: Secondary | ICD-10-CM | POA: Diagnosis not present

## 2022-01-08 DIAGNOSIS — E039 Hypothyroidism, unspecified: Secondary | ICD-10-CM | POA: Diagnosis not present

## 2022-01-08 DIAGNOSIS — J189 Pneumonia, unspecified organism: Secondary | ICD-10-CM | POA: Diagnosis not present

## 2022-01-08 DIAGNOSIS — G309 Alzheimer's disease, unspecified: Secondary | ICD-10-CM | POA: Diagnosis not present

## 2022-01-08 DIAGNOSIS — M1990 Unspecified osteoarthritis, unspecified site: Secondary | ICD-10-CM | POA: Diagnosis not present

## 2022-01-09 DIAGNOSIS — E785 Hyperlipidemia, unspecified: Secondary | ICD-10-CM | POA: Diagnosis not present

## 2022-01-09 DIAGNOSIS — M1990 Unspecified osteoarthritis, unspecified site: Secondary | ICD-10-CM | POA: Diagnosis not present

## 2022-01-09 DIAGNOSIS — N3946 Mixed incontinence: Secondary | ICD-10-CM | POA: Diagnosis not present

## 2022-01-09 DIAGNOSIS — G309 Alzheimer's disease, unspecified: Secondary | ICD-10-CM | POA: Diagnosis not present

## 2022-01-09 DIAGNOSIS — J189 Pneumonia, unspecified organism: Secondary | ICD-10-CM | POA: Diagnosis not present

## 2022-01-09 DIAGNOSIS — E039 Hypothyroidism, unspecified: Secondary | ICD-10-CM | POA: Diagnosis not present

## 2022-01-10 DIAGNOSIS — J189 Pneumonia, unspecified organism: Secondary | ICD-10-CM | POA: Diagnosis not present

## 2022-01-10 DIAGNOSIS — E785 Hyperlipidemia, unspecified: Secondary | ICD-10-CM | POA: Diagnosis not present

## 2022-01-10 DIAGNOSIS — E039 Hypothyroidism, unspecified: Secondary | ICD-10-CM | POA: Diagnosis not present

## 2022-01-10 DIAGNOSIS — G309 Alzheimer's disease, unspecified: Secondary | ICD-10-CM | POA: Diagnosis not present

## 2022-01-10 DIAGNOSIS — M1990 Unspecified osteoarthritis, unspecified site: Secondary | ICD-10-CM | POA: Diagnosis not present

## 2022-01-10 DIAGNOSIS — N3946 Mixed incontinence: Secondary | ICD-10-CM | POA: Diagnosis not present

## 2022-01-11 DIAGNOSIS — G309 Alzheimer's disease, unspecified: Secondary | ICD-10-CM | POA: Diagnosis not present

## 2022-01-11 DIAGNOSIS — N3946 Mixed incontinence: Secondary | ICD-10-CM | POA: Diagnosis not present

## 2022-01-11 DIAGNOSIS — M1990 Unspecified osteoarthritis, unspecified site: Secondary | ICD-10-CM | POA: Diagnosis not present

## 2022-01-11 DIAGNOSIS — E039 Hypothyroidism, unspecified: Secondary | ICD-10-CM | POA: Diagnosis not present

## 2022-01-11 DIAGNOSIS — E785 Hyperlipidemia, unspecified: Secondary | ICD-10-CM | POA: Diagnosis not present

## 2022-01-11 DIAGNOSIS — J189 Pneumonia, unspecified organism: Secondary | ICD-10-CM | POA: Diagnosis not present

## 2022-01-12 DIAGNOSIS — G309 Alzheimer's disease, unspecified: Secondary | ICD-10-CM | POA: Diagnosis not present

## 2022-01-12 DIAGNOSIS — M1990 Unspecified osteoarthritis, unspecified site: Secondary | ICD-10-CM | POA: Diagnosis not present

## 2022-01-12 DIAGNOSIS — J189 Pneumonia, unspecified organism: Secondary | ICD-10-CM | POA: Diagnosis not present

## 2022-01-12 DIAGNOSIS — E785 Hyperlipidemia, unspecified: Secondary | ICD-10-CM | POA: Diagnosis not present

## 2022-01-12 DIAGNOSIS — N3946 Mixed incontinence: Secondary | ICD-10-CM | POA: Diagnosis not present

## 2022-01-12 DIAGNOSIS — E039 Hypothyroidism, unspecified: Secondary | ICD-10-CM | POA: Diagnosis not present

## 2022-01-15 DIAGNOSIS — M1990 Unspecified osteoarthritis, unspecified site: Secondary | ICD-10-CM | POA: Diagnosis not present

## 2022-01-15 DIAGNOSIS — J189 Pneumonia, unspecified organism: Secondary | ICD-10-CM | POA: Diagnosis not present

## 2022-01-15 DIAGNOSIS — E785 Hyperlipidemia, unspecified: Secondary | ICD-10-CM | POA: Diagnosis not present

## 2022-01-15 DIAGNOSIS — G309 Alzheimer's disease, unspecified: Secondary | ICD-10-CM | POA: Diagnosis not present

## 2022-01-15 DIAGNOSIS — E039 Hypothyroidism, unspecified: Secondary | ICD-10-CM | POA: Diagnosis not present

## 2022-01-15 DIAGNOSIS — N3946 Mixed incontinence: Secondary | ICD-10-CM | POA: Diagnosis not present

## 2022-01-16 DIAGNOSIS — G309 Alzheimer's disease, unspecified: Secondary | ICD-10-CM | POA: Diagnosis not present

## 2022-01-16 DIAGNOSIS — E785 Hyperlipidemia, unspecified: Secondary | ICD-10-CM | POA: Diagnosis not present

## 2022-01-16 DIAGNOSIS — E039 Hypothyroidism, unspecified: Secondary | ICD-10-CM | POA: Diagnosis not present

## 2022-01-16 DIAGNOSIS — J189 Pneumonia, unspecified organism: Secondary | ICD-10-CM | POA: Diagnosis not present

## 2022-01-16 DIAGNOSIS — N3946 Mixed incontinence: Secondary | ICD-10-CM | POA: Diagnosis not present

## 2022-01-16 DIAGNOSIS — M1990 Unspecified osteoarthritis, unspecified site: Secondary | ICD-10-CM | POA: Diagnosis not present

## 2022-01-18 DIAGNOSIS — G309 Alzheimer's disease, unspecified: Secondary | ICD-10-CM | POA: Diagnosis not present

## 2022-01-18 DIAGNOSIS — N3946 Mixed incontinence: Secondary | ICD-10-CM | POA: Diagnosis not present

## 2022-01-18 DIAGNOSIS — M1990 Unspecified osteoarthritis, unspecified site: Secondary | ICD-10-CM | POA: Diagnosis not present

## 2022-01-18 DIAGNOSIS — E785 Hyperlipidemia, unspecified: Secondary | ICD-10-CM | POA: Diagnosis not present

## 2022-01-18 DIAGNOSIS — E039 Hypothyroidism, unspecified: Secondary | ICD-10-CM | POA: Diagnosis not present

## 2022-01-18 DIAGNOSIS — J189 Pneumonia, unspecified organism: Secondary | ICD-10-CM | POA: Diagnosis not present

## 2022-01-19 DIAGNOSIS — E785 Hyperlipidemia, unspecified: Secondary | ICD-10-CM | POA: Diagnosis not present

## 2022-01-19 DIAGNOSIS — E039 Hypothyroidism, unspecified: Secondary | ICD-10-CM | POA: Diagnosis not present

## 2022-01-19 DIAGNOSIS — J189 Pneumonia, unspecified organism: Secondary | ICD-10-CM | POA: Diagnosis not present

## 2022-01-19 DIAGNOSIS — G309 Alzheimer's disease, unspecified: Secondary | ICD-10-CM | POA: Diagnosis not present

## 2022-01-19 DIAGNOSIS — M1990 Unspecified osteoarthritis, unspecified site: Secondary | ICD-10-CM | POA: Diagnosis not present

## 2022-01-19 DIAGNOSIS — N3946 Mixed incontinence: Secondary | ICD-10-CM | POA: Diagnosis not present

## 2022-01-22 DIAGNOSIS — E039 Hypothyroidism, unspecified: Secondary | ICD-10-CM | POA: Diagnosis not present

## 2022-01-22 DIAGNOSIS — J189 Pneumonia, unspecified organism: Secondary | ICD-10-CM | POA: Diagnosis not present

## 2022-01-22 DIAGNOSIS — N3946 Mixed incontinence: Secondary | ICD-10-CM | POA: Diagnosis not present

## 2022-01-22 DIAGNOSIS — G309 Alzheimer's disease, unspecified: Secondary | ICD-10-CM | POA: Diagnosis not present

## 2022-01-22 DIAGNOSIS — M1990 Unspecified osteoarthritis, unspecified site: Secondary | ICD-10-CM | POA: Diagnosis not present

## 2022-01-22 DIAGNOSIS — E785 Hyperlipidemia, unspecified: Secondary | ICD-10-CM | POA: Diagnosis not present

## 2022-01-23 DIAGNOSIS — M1990 Unspecified osteoarthritis, unspecified site: Secondary | ICD-10-CM | POA: Diagnosis not present

## 2022-01-23 DIAGNOSIS — E785 Hyperlipidemia, unspecified: Secondary | ICD-10-CM | POA: Diagnosis not present

## 2022-01-23 DIAGNOSIS — G309 Alzheimer's disease, unspecified: Secondary | ICD-10-CM | POA: Diagnosis not present

## 2022-01-23 DIAGNOSIS — J189 Pneumonia, unspecified organism: Secondary | ICD-10-CM | POA: Diagnosis not present

## 2022-01-23 DIAGNOSIS — E039 Hypothyroidism, unspecified: Secondary | ICD-10-CM | POA: Diagnosis not present

## 2022-01-23 DIAGNOSIS — N3946 Mixed incontinence: Secondary | ICD-10-CM | POA: Diagnosis not present

## 2022-01-24 DIAGNOSIS — E785 Hyperlipidemia, unspecified: Secondary | ICD-10-CM | POA: Diagnosis not present

## 2022-01-24 DIAGNOSIS — J189 Pneumonia, unspecified organism: Secondary | ICD-10-CM | POA: Diagnosis not present

## 2022-01-24 DIAGNOSIS — M1990 Unspecified osteoarthritis, unspecified site: Secondary | ICD-10-CM | POA: Diagnosis not present

## 2022-01-24 DIAGNOSIS — G309 Alzheimer's disease, unspecified: Secondary | ICD-10-CM | POA: Diagnosis not present

## 2022-01-24 DIAGNOSIS — E039 Hypothyroidism, unspecified: Secondary | ICD-10-CM | POA: Diagnosis not present

## 2022-01-24 DIAGNOSIS — N3946 Mixed incontinence: Secondary | ICD-10-CM | POA: Diagnosis not present

## 2022-01-25 DIAGNOSIS — E039 Hypothyroidism, unspecified: Secondary | ICD-10-CM | POA: Diagnosis not present

## 2022-01-25 DIAGNOSIS — E785 Hyperlipidemia, unspecified: Secondary | ICD-10-CM | POA: Diagnosis not present

## 2022-01-25 DIAGNOSIS — M1990 Unspecified osteoarthritis, unspecified site: Secondary | ICD-10-CM | POA: Diagnosis not present

## 2022-01-25 DIAGNOSIS — J189 Pneumonia, unspecified organism: Secondary | ICD-10-CM | POA: Diagnosis not present

## 2022-01-25 DIAGNOSIS — G309 Alzheimer's disease, unspecified: Secondary | ICD-10-CM | POA: Diagnosis not present

## 2022-01-25 DIAGNOSIS — N3946 Mixed incontinence: Secondary | ICD-10-CM | POA: Diagnosis not present

## 2022-01-26 DIAGNOSIS — G309 Alzheimer's disease, unspecified: Secondary | ICD-10-CM | POA: Diagnosis not present

## 2022-01-26 DIAGNOSIS — M1990 Unspecified osteoarthritis, unspecified site: Secondary | ICD-10-CM | POA: Diagnosis not present

## 2022-01-26 DIAGNOSIS — E039 Hypothyroidism, unspecified: Secondary | ICD-10-CM | POA: Diagnosis not present

## 2022-01-26 DIAGNOSIS — J189 Pneumonia, unspecified organism: Secondary | ICD-10-CM | POA: Diagnosis not present

## 2022-01-26 DIAGNOSIS — N3946 Mixed incontinence: Secondary | ICD-10-CM | POA: Diagnosis not present

## 2022-01-26 DIAGNOSIS — E785 Hyperlipidemia, unspecified: Secondary | ICD-10-CM | POA: Diagnosis not present

## 2022-01-27 DIAGNOSIS — R0602 Shortness of breath: Secondary | ICD-10-CM | POA: Diagnosis not present

## 2022-01-27 DIAGNOSIS — G309 Alzheimer's disease, unspecified: Secondary | ICD-10-CM | POA: Diagnosis not present

## 2022-01-27 DIAGNOSIS — E785 Hyperlipidemia, unspecified: Secondary | ICD-10-CM | POA: Diagnosis not present

## 2022-01-27 DIAGNOSIS — N3946 Mixed incontinence: Secondary | ICD-10-CM | POA: Diagnosis not present

## 2022-01-27 DIAGNOSIS — K5900 Constipation, unspecified: Secondary | ICD-10-CM | POA: Diagnosis not present

## 2022-01-27 DIAGNOSIS — M1990 Unspecified osteoarthritis, unspecified site: Secondary | ICD-10-CM | POA: Diagnosis not present

## 2022-01-27 DIAGNOSIS — J189 Pneumonia, unspecified organism: Secondary | ICD-10-CM | POA: Diagnosis not present

## 2022-01-27 DIAGNOSIS — R531 Weakness: Secondary | ICD-10-CM | POA: Diagnosis not present

## 2022-01-27 DIAGNOSIS — E039 Hypothyroidism, unspecified: Secondary | ICD-10-CM | POA: Diagnosis not present

## 2022-01-27 DIAGNOSIS — R4182 Altered mental status, unspecified: Secondary | ICD-10-CM | POA: Diagnosis not present

## 2022-01-27 DIAGNOSIS — F419 Anxiety disorder, unspecified: Secondary | ICD-10-CM | POA: Diagnosis not present

## 2022-01-29 DIAGNOSIS — E039 Hypothyroidism, unspecified: Secondary | ICD-10-CM | POA: Diagnosis not present

## 2022-01-29 DIAGNOSIS — M1990 Unspecified osteoarthritis, unspecified site: Secondary | ICD-10-CM | POA: Diagnosis not present

## 2022-01-29 DIAGNOSIS — G309 Alzheimer's disease, unspecified: Secondary | ICD-10-CM | POA: Diagnosis not present

## 2022-01-29 DIAGNOSIS — N3946 Mixed incontinence: Secondary | ICD-10-CM | POA: Diagnosis not present

## 2022-01-29 DIAGNOSIS — J189 Pneumonia, unspecified organism: Secondary | ICD-10-CM | POA: Diagnosis not present

## 2022-01-29 DIAGNOSIS — E785 Hyperlipidemia, unspecified: Secondary | ICD-10-CM | POA: Diagnosis not present

## 2022-01-30 DIAGNOSIS — M1990 Unspecified osteoarthritis, unspecified site: Secondary | ICD-10-CM | POA: Diagnosis not present

## 2022-01-30 DIAGNOSIS — N3946 Mixed incontinence: Secondary | ICD-10-CM | POA: Diagnosis not present

## 2022-01-30 DIAGNOSIS — J189 Pneumonia, unspecified organism: Secondary | ICD-10-CM | POA: Diagnosis not present

## 2022-01-30 DIAGNOSIS — E039 Hypothyroidism, unspecified: Secondary | ICD-10-CM | POA: Diagnosis not present

## 2022-01-30 DIAGNOSIS — E785 Hyperlipidemia, unspecified: Secondary | ICD-10-CM | POA: Diagnosis not present

## 2022-01-30 DIAGNOSIS — G309 Alzheimer's disease, unspecified: Secondary | ICD-10-CM | POA: Diagnosis not present

## 2022-01-31 DIAGNOSIS — G309 Alzheimer's disease, unspecified: Secondary | ICD-10-CM | POA: Diagnosis not present

## 2022-01-31 DIAGNOSIS — J189 Pneumonia, unspecified organism: Secondary | ICD-10-CM | POA: Diagnosis not present

## 2022-01-31 DIAGNOSIS — M1990 Unspecified osteoarthritis, unspecified site: Secondary | ICD-10-CM | POA: Diagnosis not present

## 2022-01-31 DIAGNOSIS — N3946 Mixed incontinence: Secondary | ICD-10-CM | POA: Diagnosis not present

## 2022-01-31 DIAGNOSIS — E039 Hypothyroidism, unspecified: Secondary | ICD-10-CM | POA: Diagnosis not present

## 2022-01-31 DIAGNOSIS — E785 Hyperlipidemia, unspecified: Secondary | ICD-10-CM | POA: Diagnosis not present

## 2022-02-01 DIAGNOSIS — E785 Hyperlipidemia, unspecified: Secondary | ICD-10-CM | POA: Diagnosis not present

## 2022-02-01 DIAGNOSIS — E039 Hypothyroidism, unspecified: Secondary | ICD-10-CM | POA: Diagnosis not present

## 2022-02-01 DIAGNOSIS — J189 Pneumonia, unspecified organism: Secondary | ICD-10-CM | POA: Diagnosis not present

## 2022-02-01 DIAGNOSIS — N3946 Mixed incontinence: Secondary | ICD-10-CM | POA: Diagnosis not present

## 2022-02-01 DIAGNOSIS — G309 Alzheimer's disease, unspecified: Secondary | ICD-10-CM | POA: Diagnosis not present

## 2022-02-01 DIAGNOSIS — M1990 Unspecified osteoarthritis, unspecified site: Secondary | ICD-10-CM | POA: Diagnosis not present

## 2022-02-02 DIAGNOSIS — J189 Pneumonia, unspecified organism: Secondary | ICD-10-CM | POA: Diagnosis not present

## 2022-02-02 DIAGNOSIS — G309 Alzheimer's disease, unspecified: Secondary | ICD-10-CM | POA: Diagnosis not present

## 2022-02-02 DIAGNOSIS — E785 Hyperlipidemia, unspecified: Secondary | ICD-10-CM | POA: Diagnosis not present

## 2022-02-02 DIAGNOSIS — E039 Hypothyroidism, unspecified: Secondary | ICD-10-CM | POA: Diagnosis not present

## 2022-02-02 DIAGNOSIS — N3946 Mixed incontinence: Secondary | ICD-10-CM | POA: Diagnosis not present

## 2022-02-02 DIAGNOSIS — M1990 Unspecified osteoarthritis, unspecified site: Secondary | ICD-10-CM | POA: Diagnosis not present

## 2022-02-05 DIAGNOSIS — J189 Pneumonia, unspecified organism: Secondary | ICD-10-CM | POA: Diagnosis not present

## 2022-02-05 DIAGNOSIS — E039 Hypothyroidism, unspecified: Secondary | ICD-10-CM | POA: Diagnosis not present

## 2022-02-05 DIAGNOSIS — M1990 Unspecified osteoarthritis, unspecified site: Secondary | ICD-10-CM | POA: Diagnosis not present

## 2022-02-05 DIAGNOSIS — E785 Hyperlipidemia, unspecified: Secondary | ICD-10-CM | POA: Diagnosis not present

## 2022-02-05 DIAGNOSIS — N3946 Mixed incontinence: Secondary | ICD-10-CM | POA: Diagnosis not present

## 2022-02-05 DIAGNOSIS — G309 Alzheimer's disease, unspecified: Secondary | ICD-10-CM | POA: Diagnosis not present

## 2022-02-06 DIAGNOSIS — N3946 Mixed incontinence: Secondary | ICD-10-CM | POA: Diagnosis not present

## 2022-02-06 DIAGNOSIS — E785 Hyperlipidemia, unspecified: Secondary | ICD-10-CM | POA: Diagnosis not present

## 2022-02-06 DIAGNOSIS — E039 Hypothyroidism, unspecified: Secondary | ICD-10-CM | POA: Diagnosis not present

## 2022-02-06 DIAGNOSIS — M1990 Unspecified osteoarthritis, unspecified site: Secondary | ICD-10-CM | POA: Diagnosis not present

## 2022-02-06 DIAGNOSIS — J189 Pneumonia, unspecified organism: Secondary | ICD-10-CM | POA: Diagnosis not present

## 2022-02-06 DIAGNOSIS — G309 Alzheimer's disease, unspecified: Secondary | ICD-10-CM | POA: Diagnosis not present

## 2022-02-07 DIAGNOSIS — E039 Hypothyroidism, unspecified: Secondary | ICD-10-CM | POA: Diagnosis not present

## 2022-02-07 DIAGNOSIS — N3946 Mixed incontinence: Secondary | ICD-10-CM | POA: Diagnosis not present

## 2022-02-07 DIAGNOSIS — M1990 Unspecified osteoarthritis, unspecified site: Secondary | ICD-10-CM | POA: Diagnosis not present

## 2022-02-07 DIAGNOSIS — G309 Alzheimer's disease, unspecified: Secondary | ICD-10-CM | POA: Diagnosis not present

## 2022-02-07 DIAGNOSIS — E785 Hyperlipidemia, unspecified: Secondary | ICD-10-CM | POA: Diagnosis not present

## 2022-02-07 DIAGNOSIS — J189 Pneumonia, unspecified organism: Secondary | ICD-10-CM | POA: Diagnosis not present

## 2022-02-08 DIAGNOSIS — G309 Alzheimer's disease, unspecified: Secondary | ICD-10-CM | POA: Diagnosis not present

## 2022-02-08 DIAGNOSIS — J189 Pneumonia, unspecified organism: Secondary | ICD-10-CM | POA: Diagnosis not present

## 2022-02-08 DIAGNOSIS — N3946 Mixed incontinence: Secondary | ICD-10-CM | POA: Diagnosis not present

## 2022-02-08 DIAGNOSIS — E039 Hypothyroidism, unspecified: Secondary | ICD-10-CM | POA: Diagnosis not present

## 2022-02-08 DIAGNOSIS — M1990 Unspecified osteoarthritis, unspecified site: Secondary | ICD-10-CM | POA: Diagnosis not present

## 2022-02-08 DIAGNOSIS — E785 Hyperlipidemia, unspecified: Secondary | ICD-10-CM | POA: Diagnosis not present

## 2022-02-12 DIAGNOSIS — M1990 Unspecified osteoarthritis, unspecified site: Secondary | ICD-10-CM | POA: Diagnosis not present

## 2022-02-12 DIAGNOSIS — J189 Pneumonia, unspecified organism: Secondary | ICD-10-CM | POA: Diagnosis not present

## 2022-02-12 DIAGNOSIS — E039 Hypothyroidism, unspecified: Secondary | ICD-10-CM | POA: Diagnosis not present

## 2022-02-12 DIAGNOSIS — N3946 Mixed incontinence: Secondary | ICD-10-CM | POA: Diagnosis not present

## 2022-02-12 DIAGNOSIS — G309 Alzheimer's disease, unspecified: Secondary | ICD-10-CM | POA: Diagnosis not present

## 2022-02-12 DIAGNOSIS — E785 Hyperlipidemia, unspecified: Secondary | ICD-10-CM | POA: Diagnosis not present

## 2022-02-13 DIAGNOSIS — M1990 Unspecified osteoarthritis, unspecified site: Secondary | ICD-10-CM | POA: Diagnosis not present

## 2022-02-13 DIAGNOSIS — E039 Hypothyroidism, unspecified: Secondary | ICD-10-CM | POA: Diagnosis not present

## 2022-02-13 DIAGNOSIS — J189 Pneumonia, unspecified organism: Secondary | ICD-10-CM | POA: Diagnosis not present

## 2022-02-13 DIAGNOSIS — E785 Hyperlipidemia, unspecified: Secondary | ICD-10-CM | POA: Diagnosis not present

## 2022-02-13 DIAGNOSIS — G309 Alzheimer's disease, unspecified: Secondary | ICD-10-CM | POA: Diagnosis not present

## 2022-02-13 DIAGNOSIS — N3946 Mixed incontinence: Secondary | ICD-10-CM | POA: Diagnosis not present

## 2022-02-14 DIAGNOSIS — G309 Alzheimer's disease, unspecified: Secondary | ICD-10-CM | POA: Diagnosis not present

## 2022-02-14 DIAGNOSIS — J189 Pneumonia, unspecified organism: Secondary | ICD-10-CM | POA: Diagnosis not present

## 2022-02-14 DIAGNOSIS — E039 Hypothyroidism, unspecified: Secondary | ICD-10-CM | POA: Diagnosis not present

## 2022-02-14 DIAGNOSIS — M1990 Unspecified osteoarthritis, unspecified site: Secondary | ICD-10-CM | POA: Diagnosis not present

## 2022-02-14 DIAGNOSIS — N3946 Mixed incontinence: Secondary | ICD-10-CM | POA: Diagnosis not present

## 2022-02-14 DIAGNOSIS — E785 Hyperlipidemia, unspecified: Secondary | ICD-10-CM | POA: Diagnosis not present

## 2022-02-16 DIAGNOSIS — N3946 Mixed incontinence: Secondary | ICD-10-CM | POA: Diagnosis not present

## 2022-02-16 DIAGNOSIS — G309 Alzheimer's disease, unspecified: Secondary | ICD-10-CM | POA: Diagnosis not present

## 2022-02-16 DIAGNOSIS — J189 Pneumonia, unspecified organism: Secondary | ICD-10-CM | POA: Diagnosis not present

## 2022-02-16 DIAGNOSIS — E785 Hyperlipidemia, unspecified: Secondary | ICD-10-CM | POA: Diagnosis not present

## 2022-02-16 DIAGNOSIS — M1990 Unspecified osteoarthritis, unspecified site: Secondary | ICD-10-CM | POA: Diagnosis not present

## 2022-02-16 DIAGNOSIS — E039 Hypothyroidism, unspecified: Secondary | ICD-10-CM | POA: Diagnosis not present

## 2022-02-19 DIAGNOSIS — E785 Hyperlipidemia, unspecified: Secondary | ICD-10-CM | POA: Diagnosis not present

## 2022-02-19 DIAGNOSIS — J189 Pneumonia, unspecified organism: Secondary | ICD-10-CM | POA: Diagnosis not present

## 2022-02-19 DIAGNOSIS — N3946 Mixed incontinence: Secondary | ICD-10-CM | POA: Diagnosis not present

## 2022-02-19 DIAGNOSIS — E039 Hypothyroidism, unspecified: Secondary | ICD-10-CM | POA: Diagnosis not present

## 2022-02-19 DIAGNOSIS — G309 Alzheimer's disease, unspecified: Secondary | ICD-10-CM | POA: Diagnosis not present

## 2022-02-19 DIAGNOSIS — M1990 Unspecified osteoarthritis, unspecified site: Secondary | ICD-10-CM | POA: Diagnosis not present

## 2022-02-20 DIAGNOSIS — M1990 Unspecified osteoarthritis, unspecified site: Secondary | ICD-10-CM | POA: Diagnosis not present

## 2022-02-20 DIAGNOSIS — J189 Pneumonia, unspecified organism: Secondary | ICD-10-CM | POA: Diagnosis not present

## 2022-02-20 DIAGNOSIS — E039 Hypothyroidism, unspecified: Secondary | ICD-10-CM | POA: Diagnosis not present

## 2022-02-20 DIAGNOSIS — N3946 Mixed incontinence: Secondary | ICD-10-CM | POA: Diagnosis not present

## 2022-02-20 DIAGNOSIS — E785 Hyperlipidemia, unspecified: Secondary | ICD-10-CM | POA: Diagnosis not present

## 2022-02-20 DIAGNOSIS — G309 Alzheimer's disease, unspecified: Secondary | ICD-10-CM | POA: Diagnosis not present

## 2022-02-21 DIAGNOSIS — E785 Hyperlipidemia, unspecified: Secondary | ICD-10-CM | POA: Diagnosis not present

## 2022-02-21 DIAGNOSIS — J189 Pneumonia, unspecified organism: Secondary | ICD-10-CM | POA: Diagnosis not present

## 2022-02-21 DIAGNOSIS — M1990 Unspecified osteoarthritis, unspecified site: Secondary | ICD-10-CM | POA: Diagnosis not present

## 2022-02-21 DIAGNOSIS — E039 Hypothyroidism, unspecified: Secondary | ICD-10-CM | POA: Diagnosis not present

## 2022-02-21 DIAGNOSIS — G309 Alzheimer's disease, unspecified: Secondary | ICD-10-CM | POA: Diagnosis not present

## 2022-02-21 DIAGNOSIS — N3946 Mixed incontinence: Secondary | ICD-10-CM | POA: Diagnosis not present

## 2022-02-23 DIAGNOSIS — M1990 Unspecified osteoarthritis, unspecified site: Secondary | ICD-10-CM | POA: Diagnosis not present

## 2022-02-23 DIAGNOSIS — J189 Pneumonia, unspecified organism: Secondary | ICD-10-CM | POA: Diagnosis not present

## 2022-02-23 DIAGNOSIS — E785 Hyperlipidemia, unspecified: Secondary | ICD-10-CM | POA: Diagnosis not present

## 2022-02-23 DIAGNOSIS — N3946 Mixed incontinence: Secondary | ICD-10-CM | POA: Diagnosis not present

## 2022-02-23 DIAGNOSIS — G309 Alzheimer's disease, unspecified: Secondary | ICD-10-CM | POA: Diagnosis not present

## 2022-02-23 DIAGNOSIS — E039 Hypothyroidism, unspecified: Secondary | ICD-10-CM | POA: Diagnosis not present

## 2022-02-26 DIAGNOSIS — F419 Anxiety disorder, unspecified: Secondary | ICD-10-CM | POA: Diagnosis not present

## 2022-02-26 DIAGNOSIS — E785 Hyperlipidemia, unspecified: Secondary | ICD-10-CM | POA: Diagnosis not present

## 2022-02-26 DIAGNOSIS — M1990 Unspecified osteoarthritis, unspecified site: Secondary | ICD-10-CM | POA: Diagnosis not present

## 2022-02-26 DIAGNOSIS — R531 Weakness: Secondary | ICD-10-CM | POA: Diagnosis not present

## 2022-02-26 DIAGNOSIS — R4182 Altered mental status, unspecified: Secondary | ICD-10-CM | POA: Diagnosis not present

## 2022-02-26 DIAGNOSIS — E039 Hypothyroidism, unspecified: Secondary | ICD-10-CM | POA: Diagnosis not present

## 2022-02-26 DIAGNOSIS — R0602 Shortness of breath: Secondary | ICD-10-CM | POA: Diagnosis not present

## 2022-02-26 DIAGNOSIS — N3946 Mixed incontinence: Secondary | ICD-10-CM | POA: Diagnosis not present

## 2022-02-26 DIAGNOSIS — J189 Pneumonia, unspecified organism: Secondary | ICD-10-CM | POA: Diagnosis not present

## 2022-02-26 DIAGNOSIS — K5900 Constipation, unspecified: Secondary | ICD-10-CM | POA: Diagnosis not present

## 2022-02-26 DIAGNOSIS — G309 Alzheimer's disease, unspecified: Secondary | ICD-10-CM | POA: Diagnosis not present

## 2022-02-27 DIAGNOSIS — E785 Hyperlipidemia, unspecified: Secondary | ICD-10-CM | POA: Diagnosis not present

## 2022-02-27 DIAGNOSIS — N3946 Mixed incontinence: Secondary | ICD-10-CM | POA: Diagnosis not present

## 2022-02-27 DIAGNOSIS — M1990 Unspecified osteoarthritis, unspecified site: Secondary | ICD-10-CM | POA: Diagnosis not present

## 2022-02-27 DIAGNOSIS — J189 Pneumonia, unspecified organism: Secondary | ICD-10-CM | POA: Diagnosis not present

## 2022-02-27 DIAGNOSIS — G309 Alzheimer's disease, unspecified: Secondary | ICD-10-CM | POA: Diagnosis not present

## 2022-02-27 DIAGNOSIS — E039 Hypothyroidism, unspecified: Secondary | ICD-10-CM | POA: Diagnosis not present

## 2022-02-28 DIAGNOSIS — M1990 Unspecified osteoarthritis, unspecified site: Secondary | ICD-10-CM | POA: Diagnosis not present

## 2022-02-28 DIAGNOSIS — N3946 Mixed incontinence: Secondary | ICD-10-CM | POA: Diagnosis not present

## 2022-02-28 DIAGNOSIS — G309 Alzheimer's disease, unspecified: Secondary | ICD-10-CM | POA: Diagnosis not present

## 2022-02-28 DIAGNOSIS — E785 Hyperlipidemia, unspecified: Secondary | ICD-10-CM | POA: Diagnosis not present

## 2022-02-28 DIAGNOSIS — J189 Pneumonia, unspecified organism: Secondary | ICD-10-CM | POA: Diagnosis not present

## 2022-02-28 DIAGNOSIS — E039 Hypothyroidism, unspecified: Secondary | ICD-10-CM | POA: Diagnosis not present

## 2022-03-02 DIAGNOSIS — G309 Alzheimer's disease, unspecified: Secondary | ICD-10-CM | POA: Diagnosis not present

## 2022-03-02 DIAGNOSIS — J189 Pneumonia, unspecified organism: Secondary | ICD-10-CM | POA: Diagnosis not present

## 2022-03-02 DIAGNOSIS — E785 Hyperlipidemia, unspecified: Secondary | ICD-10-CM | POA: Diagnosis not present

## 2022-03-02 DIAGNOSIS — M1990 Unspecified osteoarthritis, unspecified site: Secondary | ICD-10-CM | POA: Diagnosis not present

## 2022-03-02 DIAGNOSIS — N3946 Mixed incontinence: Secondary | ICD-10-CM | POA: Diagnosis not present

## 2022-03-02 DIAGNOSIS — E039 Hypothyroidism, unspecified: Secondary | ICD-10-CM | POA: Diagnosis not present

## 2022-03-05 DIAGNOSIS — N3946 Mixed incontinence: Secondary | ICD-10-CM | POA: Diagnosis not present

## 2022-03-05 DIAGNOSIS — E039 Hypothyroidism, unspecified: Secondary | ICD-10-CM | POA: Diagnosis not present

## 2022-03-05 DIAGNOSIS — G309 Alzheimer's disease, unspecified: Secondary | ICD-10-CM | POA: Diagnosis not present

## 2022-03-05 DIAGNOSIS — J189 Pneumonia, unspecified organism: Secondary | ICD-10-CM | POA: Diagnosis not present

## 2022-03-05 DIAGNOSIS — M1990 Unspecified osteoarthritis, unspecified site: Secondary | ICD-10-CM | POA: Diagnosis not present

## 2022-03-05 DIAGNOSIS — E785 Hyperlipidemia, unspecified: Secondary | ICD-10-CM | POA: Diagnosis not present

## 2022-03-06 DIAGNOSIS — E039 Hypothyroidism, unspecified: Secondary | ICD-10-CM | POA: Diagnosis not present

## 2022-03-06 DIAGNOSIS — N3946 Mixed incontinence: Secondary | ICD-10-CM | POA: Diagnosis not present

## 2022-03-06 DIAGNOSIS — J189 Pneumonia, unspecified organism: Secondary | ICD-10-CM | POA: Diagnosis not present

## 2022-03-06 DIAGNOSIS — M1990 Unspecified osteoarthritis, unspecified site: Secondary | ICD-10-CM | POA: Diagnosis not present

## 2022-03-06 DIAGNOSIS — G309 Alzheimer's disease, unspecified: Secondary | ICD-10-CM | POA: Diagnosis not present

## 2022-03-06 DIAGNOSIS — E785 Hyperlipidemia, unspecified: Secondary | ICD-10-CM | POA: Diagnosis not present

## 2022-03-07 DIAGNOSIS — M1990 Unspecified osteoarthritis, unspecified site: Secondary | ICD-10-CM | POA: Diagnosis not present

## 2022-03-07 DIAGNOSIS — E785 Hyperlipidemia, unspecified: Secondary | ICD-10-CM | POA: Diagnosis not present

## 2022-03-07 DIAGNOSIS — N3946 Mixed incontinence: Secondary | ICD-10-CM | POA: Diagnosis not present

## 2022-03-07 DIAGNOSIS — G309 Alzheimer's disease, unspecified: Secondary | ICD-10-CM | POA: Diagnosis not present

## 2022-03-07 DIAGNOSIS — J189 Pneumonia, unspecified organism: Secondary | ICD-10-CM | POA: Diagnosis not present

## 2022-03-07 DIAGNOSIS — E039 Hypothyroidism, unspecified: Secondary | ICD-10-CM | POA: Diagnosis not present

## 2022-03-12 DIAGNOSIS — E039 Hypothyroidism, unspecified: Secondary | ICD-10-CM | POA: Diagnosis not present

## 2022-03-12 DIAGNOSIS — E785 Hyperlipidemia, unspecified: Secondary | ICD-10-CM | POA: Diagnosis not present

## 2022-03-12 DIAGNOSIS — G309 Alzheimer's disease, unspecified: Secondary | ICD-10-CM | POA: Diagnosis not present

## 2022-03-12 DIAGNOSIS — N3946 Mixed incontinence: Secondary | ICD-10-CM | POA: Diagnosis not present

## 2022-03-12 DIAGNOSIS — J189 Pneumonia, unspecified organism: Secondary | ICD-10-CM | POA: Diagnosis not present

## 2022-03-12 DIAGNOSIS — M1990 Unspecified osteoarthritis, unspecified site: Secondary | ICD-10-CM | POA: Diagnosis not present

## 2022-03-13 DIAGNOSIS — J189 Pneumonia, unspecified organism: Secondary | ICD-10-CM | POA: Diagnosis not present

## 2022-03-13 DIAGNOSIS — M1990 Unspecified osteoarthritis, unspecified site: Secondary | ICD-10-CM | POA: Diagnosis not present

## 2022-03-13 DIAGNOSIS — E785 Hyperlipidemia, unspecified: Secondary | ICD-10-CM | POA: Diagnosis not present

## 2022-03-13 DIAGNOSIS — E039 Hypothyroidism, unspecified: Secondary | ICD-10-CM | POA: Diagnosis not present

## 2022-03-13 DIAGNOSIS — G309 Alzheimer's disease, unspecified: Secondary | ICD-10-CM | POA: Diagnosis not present

## 2022-03-13 DIAGNOSIS — N3946 Mixed incontinence: Secondary | ICD-10-CM | POA: Diagnosis not present

## 2022-03-14 DIAGNOSIS — E039 Hypothyroidism, unspecified: Secondary | ICD-10-CM | POA: Diagnosis not present

## 2022-03-14 DIAGNOSIS — N3946 Mixed incontinence: Secondary | ICD-10-CM | POA: Diagnosis not present

## 2022-03-14 DIAGNOSIS — G309 Alzheimer's disease, unspecified: Secondary | ICD-10-CM | POA: Diagnosis not present

## 2022-03-14 DIAGNOSIS — M1990 Unspecified osteoarthritis, unspecified site: Secondary | ICD-10-CM | POA: Diagnosis not present

## 2022-03-14 DIAGNOSIS — J189 Pneumonia, unspecified organism: Secondary | ICD-10-CM | POA: Diagnosis not present

## 2022-03-14 DIAGNOSIS — E785 Hyperlipidemia, unspecified: Secondary | ICD-10-CM | POA: Diagnosis not present

## 2022-03-15 DIAGNOSIS — J189 Pneumonia, unspecified organism: Secondary | ICD-10-CM | POA: Diagnosis not present

## 2022-03-15 DIAGNOSIS — E039 Hypothyroidism, unspecified: Secondary | ICD-10-CM | POA: Diagnosis not present

## 2022-03-15 DIAGNOSIS — M1990 Unspecified osteoarthritis, unspecified site: Secondary | ICD-10-CM | POA: Diagnosis not present

## 2022-03-15 DIAGNOSIS — N3946 Mixed incontinence: Secondary | ICD-10-CM | POA: Diagnosis not present

## 2022-03-15 DIAGNOSIS — E785 Hyperlipidemia, unspecified: Secondary | ICD-10-CM | POA: Diagnosis not present

## 2022-03-15 DIAGNOSIS — G309 Alzheimer's disease, unspecified: Secondary | ICD-10-CM | POA: Diagnosis not present

## 2022-03-16 DIAGNOSIS — E785 Hyperlipidemia, unspecified: Secondary | ICD-10-CM | POA: Diagnosis not present

## 2022-03-16 DIAGNOSIS — E039 Hypothyroidism, unspecified: Secondary | ICD-10-CM | POA: Diagnosis not present

## 2022-03-16 DIAGNOSIS — G309 Alzheimer's disease, unspecified: Secondary | ICD-10-CM | POA: Diagnosis not present

## 2022-03-16 DIAGNOSIS — N3946 Mixed incontinence: Secondary | ICD-10-CM | POA: Diagnosis not present

## 2022-03-16 DIAGNOSIS — M1990 Unspecified osteoarthritis, unspecified site: Secondary | ICD-10-CM | POA: Diagnosis not present

## 2022-03-16 DIAGNOSIS — J189 Pneumonia, unspecified organism: Secondary | ICD-10-CM | POA: Diagnosis not present

## 2022-03-19 DIAGNOSIS — J189 Pneumonia, unspecified organism: Secondary | ICD-10-CM | POA: Diagnosis not present

## 2022-03-19 DIAGNOSIS — M1990 Unspecified osteoarthritis, unspecified site: Secondary | ICD-10-CM | POA: Diagnosis not present

## 2022-03-19 DIAGNOSIS — N3946 Mixed incontinence: Secondary | ICD-10-CM | POA: Diagnosis not present

## 2022-03-19 DIAGNOSIS — G309 Alzheimer's disease, unspecified: Secondary | ICD-10-CM | POA: Diagnosis not present

## 2022-03-19 DIAGNOSIS — E039 Hypothyroidism, unspecified: Secondary | ICD-10-CM | POA: Diagnosis not present

## 2022-03-19 DIAGNOSIS — E785 Hyperlipidemia, unspecified: Secondary | ICD-10-CM | POA: Diagnosis not present

## 2022-03-20 DIAGNOSIS — J189 Pneumonia, unspecified organism: Secondary | ICD-10-CM | POA: Diagnosis not present

## 2022-03-20 DIAGNOSIS — M1990 Unspecified osteoarthritis, unspecified site: Secondary | ICD-10-CM | POA: Diagnosis not present

## 2022-03-20 DIAGNOSIS — E785 Hyperlipidemia, unspecified: Secondary | ICD-10-CM | POA: Diagnosis not present

## 2022-03-20 DIAGNOSIS — N3946 Mixed incontinence: Secondary | ICD-10-CM | POA: Diagnosis not present

## 2022-03-20 DIAGNOSIS — G309 Alzheimer's disease, unspecified: Secondary | ICD-10-CM | POA: Diagnosis not present

## 2022-03-20 DIAGNOSIS — E039 Hypothyroidism, unspecified: Secondary | ICD-10-CM | POA: Diagnosis not present

## 2022-03-21 DIAGNOSIS — E039 Hypothyroidism, unspecified: Secondary | ICD-10-CM | POA: Diagnosis not present

## 2022-03-21 DIAGNOSIS — J189 Pneumonia, unspecified organism: Secondary | ICD-10-CM | POA: Diagnosis not present

## 2022-03-21 DIAGNOSIS — E785 Hyperlipidemia, unspecified: Secondary | ICD-10-CM | POA: Diagnosis not present

## 2022-03-21 DIAGNOSIS — G309 Alzheimer's disease, unspecified: Secondary | ICD-10-CM | POA: Diagnosis not present

## 2022-03-21 DIAGNOSIS — N3946 Mixed incontinence: Secondary | ICD-10-CM | POA: Diagnosis not present

## 2022-03-21 DIAGNOSIS — M1990 Unspecified osteoarthritis, unspecified site: Secondary | ICD-10-CM | POA: Diagnosis not present

## 2022-03-23 DIAGNOSIS — E039 Hypothyroidism, unspecified: Secondary | ICD-10-CM | POA: Diagnosis not present

## 2022-03-23 DIAGNOSIS — M1990 Unspecified osteoarthritis, unspecified site: Secondary | ICD-10-CM | POA: Diagnosis not present

## 2022-03-23 DIAGNOSIS — N3946 Mixed incontinence: Secondary | ICD-10-CM | POA: Diagnosis not present

## 2022-03-23 DIAGNOSIS — G309 Alzheimer's disease, unspecified: Secondary | ICD-10-CM | POA: Diagnosis not present

## 2022-03-23 DIAGNOSIS — J189 Pneumonia, unspecified organism: Secondary | ICD-10-CM | POA: Diagnosis not present

## 2022-03-23 DIAGNOSIS — E785 Hyperlipidemia, unspecified: Secondary | ICD-10-CM | POA: Diagnosis not present

## 2022-03-26 DIAGNOSIS — E785 Hyperlipidemia, unspecified: Secondary | ICD-10-CM | POA: Diagnosis not present

## 2022-03-26 DIAGNOSIS — M1990 Unspecified osteoarthritis, unspecified site: Secondary | ICD-10-CM | POA: Diagnosis not present

## 2022-03-26 DIAGNOSIS — E039 Hypothyroidism, unspecified: Secondary | ICD-10-CM | POA: Diagnosis not present

## 2022-03-26 DIAGNOSIS — N3946 Mixed incontinence: Secondary | ICD-10-CM | POA: Diagnosis not present

## 2022-03-26 DIAGNOSIS — G309 Alzheimer's disease, unspecified: Secondary | ICD-10-CM | POA: Diagnosis not present

## 2022-03-26 DIAGNOSIS — J189 Pneumonia, unspecified organism: Secondary | ICD-10-CM | POA: Diagnosis not present

## 2022-03-28 DIAGNOSIS — E785 Hyperlipidemia, unspecified: Secondary | ICD-10-CM | POA: Diagnosis not present

## 2022-03-28 DIAGNOSIS — G309 Alzheimer's disease, unspecified: Secondary | ICD-10-CM | POA: Diagnosis not present

## 2022-03-28 DIAGNOSIS — N3946 Mixed incontinence: Secondary | ICD-10-CM | POA: Diagnosis not present

## 2022-03-28 DIAGNOSIS — M1990 Unspecified osteoarthritis, unspecified site: Secondary | ICD-10-CM | POA: Diagnosis not present

## 2022-03-28 DIAGNOSIS — E039 Hypothyroidism, unspecified: Secondary | ICD-10-CM | POA: Diagnosis not present

## 2022-03-28 DIAGNOSIS — J189 Pneumonia, unspecified organism: Secondary | ICD-10-CM | POA: Diagnosis not present

## 2022-03-29 DIAGNOSIS — E785 Hyperlipidemia, unspecified: Secondary | ICD-10-CM | POA: Diagnosis not present

## 2022-03-29 DIAGNOSIS — G309 Alzheimer's disease, unspecified: Secondary | ICD-10-CM | POA: Diagnosis not present

## 2022-03-29 DIAGNOSIS — R0602 Shortness of breath: Secondary | ICD-10-CM | POA: Diagnosis not present

## 2022-03-29 DIAGNOSIS — J189 Pneumonia, unspecified organism: Secondary | ICD-10-CM | POA: Diagnosis not present

## 2022-03-29 DIAGNOSIS — F419 Anxiety disorder, unspecified: Secondary | ICD-10-CM | POA: Diagnosis not present

## 2022-03-29 DIAGNOSIS — K5900 Constipation, unspecified: Secondary | ICD-10-CM | POA: Diagnosis not present

## 2022-03-29 DIAGNOSIS — E039 Hypothyroidism, unspecified: Secondary | ICD-10-CM | POA: Diagnosis not present

## 2022-03-29 DIAGNOSIS — M1990 Unspecified osteoarthritis, unspecified site: Secondary | ICD-10-CM | POA: Diagnosis not present

## 2022-03-29 DIAGNOSIS — R4182 Altered mental status, unspecified: Secondary | ICD-10-CM | POA: Diagnosis not present

## 2022-03-29 DIAGNOSIS — R531 Weakness: Secondary | ICD-10-CM | POA: Diagnosis not present

## 2022-03-29 DIAGNOSIS — N3946 Mixed incontinence: Secondary | ICD-10-CM | POA: Diagnosis not present

## 2022-03-30 DIAGNOSIS — E039 Hypothyroidism, unspecified: Secondary | ICD-10-CM | POA: Diagnosis not present

## 2022-03-30 DIAGNOSIS — E785 Hyperlipidemia, unspecified: Secondary | ICD-10-CM | POA: Diagnosis not present

## 2022-03-30 DIAGNOSIS — G309 Alzheimer's disease, unspecified: Secondary | ICD-10-CM | POA: Diagnosis not present

## 2022-03-30 DIAGNOSIS — J189 Pneumonia, unspecified organism: Secondary | ICD-10-CM | POA: Diagnosis not present

## 2022-03-30 DIAGNOSIS — N3946 Mixed incontinence: Secondary | ICD-10-CM | POA: Diagnosis not present

## 2022-03-30 DIAGNOSIS — M1990 Unspecified osteoarthritis, unspecified site: Secondary | ICD-10-CM | POA: Diagnosis not present

## 2022-04-02 DIAGNOSIS — M1990 Unspecified osteoarthritis, unspecified site: Secondary | ICD-10-CM | POA: Diagnosis not present

## 2022-04-02 DIAGNOSIS — J189 Pneumonia, unspecified organism: Secondary | ICD-10-CM | POA: Diagnosis not present

## 2022-04-02 DIAGNOSIS — G309 Alzheimer's disease, unspecified: Secondary | ICD-10-CM | POA: Diagnosis not present

## 2022-04-02 DIAGNOSIS — E039 Hypothyroidism, unspecified: Secondary | ICD-10-CM | POA: Diagnosis not present

## 2022-04-02 DIAGNOSIS — E785 Hyperlipidemia, unspecified: Secondary | ICD-10-CM | POA: Diagnosis not present

## 2022-04-02 DIAGNOSIS — N3946 Mixed incontinence: Secondary | ICD-10-CM | POA: Diagnosis not present

## 2022-04-04 DIAGNOSIS — E785 Hyperlipidemia, unspecified: Secondary | ICD-10-CM | POA: Diagnosis not present

## 2022-04-04 DIAGNOSIS — M1990 Unspecified osteoarthritis, unspecified site: Secondary | ICD-10-CM | POA: Diagnosis not present

## 2022-04-04 DIAGNOSIS — N3946 Mixed incontinence: Secondary | ICD-10-CM | POA: Diagnosis not present

## 2022-04-04 DIAGNOSIS — G309 Alzheimer's disease, unspecified: Secondary | ICD-10-CM | POA: Diagnosis not present

## 2022-04-04 DIAGNOSIS — E039 Hypothyroidism, unspecified: Secondary | ICD-10-CM | POA: Diagnosis not present

## 2022-04-04 DIAGNOSIS — J189 Pneumonia, unspecified organism: Secondary | ICD-10-CM | POA: Diagnosis not present

## 2022-04-05 DIAGNOSIS — J189 Pneumonia, unspecified organism: Secondary | ICD-10-CM | POA: Diagnosis not present

## 2022-04-05 DIAGNOSIS — G309 Alzheimer's disease, unspecified: Secondary | ICD-10-CM | POA: Diagnosis not present

## 2022-04-05 DIAGNOSIS — E785 Hyperlipidemia, unspecified: Secondary | ICD-10-CM | POA: Diagnosis not present

## 2022-04-05 DIAGNOSIS — N3946 Mixed incontinence: Secondary | ICD-10-CM | POA: Diagnosis not present

## 2022-04-05 DIAGNOSIS — M1990 Unspecified osteoarthritis, unspecified site: Secondary | ICD-10-CM | POA: Diagnosis not present

## 2022-04-05 DIAGNOSIS — E039 Hypothyroidism, unspecified: Secondary | ICD-10-CM | POA: Diagnosis not present

## 2022-04-06 DIAGNOSIS — E785 Hyperlipidemia, unspecified: Secondary | ICD-10-CM | POA: Diagnosis not present

## 2022-04-06 DIAGNOSIS — E039 Hypothyroidism, unspecified: Secondary | ICD-10-CM | POA: Diagnosis not present

## 2022-04-06 DIAGNOSIS — J189 Pneumonia, unspecified organism: Secondary | ICD-10-CM | POA: Diagnosis not present

## 2022-04-06 DIAGNOSIS — G309 Alzheimer's disease, unspecified: Secondary | ICD-10-CM | POA: Diagnosis not present

## 2022-04-06 DIAGNOSIS — M1990 Unspecified osteoarthritis, unspecified site: Secondary | ICD-10-CM | POA: Diagnosis not present

## 2022-04-06 DIAGNOSIS — N3946 Mixed incontinence: Secondary | ICD-10-CM | POA: Diagnosis not present

## 2022-04-09 DIAGNOSIS — E039 Hypothyroidism, unspecified: Secondary | ICD-10-CM | POA: Diagnosis not present

## 2022-04-09 DIAGNOSIS — G309 Alzheimer's disease, unspecified: Secondary | ICD-10-CM | POA: Diagnosis not present

## 2022-04-09 DIAGNOSIS — N3946 Mixed incontinence: Secondary | ICD-10-CM | POA: Diagnosis not present

## 2022-04-09 DIAGNOSIS — J189 Pneumonia, unspecified organism: Secondary | ICD-10-CM | POA: Diagnosis not present

## 2022-04-09 DIAGNOSIS — M1990 Unspecified osteoarthritis, unspecified site: Secondary | ICD-10-CM | POA: Diagnosis not present

## 2022-04-09 DIAGNOSIS — E785 Hyperlipidemia, unspecified: Secondary | ICD-10-CM | POA: Diagnosis not present

## 2022-04-11 DIAGNOSIS — G309 Alzheimer's disease, unspecified: Secondary | ICD-10-CM | POA: Diagnosis not present

## 2022-04-11 DIAGNOSIS — E039 Hypothyroidism, unspecified: Secondary | ICD-10-CM | POA: Diagnosis not present

## 2022-04-11 DIAGNOSIS — N3946 Mixed incontinence: Secondary | ICD-10-CM | POA: Diagnosis not present

## 2022-04-11 DIAGNOSIS — J189 Pneumonia, unspecified organism: Secondary | ICD-10-CM | POA: Diagnosis not present

## 2022-04-11 DIAGNOSIS — E785 Hyperlipidemia, unspecified: Secondary | ICD-10-CM | POA: Diagnosis not present

## 2022-04-11 DIAGNOSIS — M1990 Unspecified osteoarthritis, unspecified site: Secondary | ICD-10-CM | POA: Diagnosis not present

## 2022-04-12 DIAGNOSIS — N3946 Mixed incontinence: Secondary | ICD-10-CM | POA: Diagnosis not present

## 2022-04-12 DIAGNOSIS — M1990 Unspecified osteoarthritis, unspecified site: Secondary | ICD-10-CM | POA: Diagnosis not present

## 2022-04-12 DIAGNOSIS — G309 Alzheimer's disease, unspecified: Secondary | ICD-10-CM | POA: Diagnosis not present

## 2022-04-12 DIAGNOSIS — E785 Hyperlipidemia, unspecified: Secondary | ICD-10-CM | POA: Diagnosis not present

## 2022-04-12 DIAGNOSIS — E039 Hypothyroidism, unspecified: Secondary | ICD-10-CM | POA: Diagnosis not present

## 2022-04-12 DIAGNOSIS — J189 Pneumonia, unspecified organism: Secondary | ICD-10-CM | POA: Diagnosis not present

## 2022-04-13 DIAGNOSIS — N3946 Mixed incontinence: Secondary | ICD-10-CM | POA: Diagnosis not present

## 2022-04-13 DIAGNOSIS — J189 Pneumonia, unspecified organism: Secondary | ICD-10-CM | POA: Diagnosis not present

## 2022-04-13 DIAGNOSIS — E039 Hypothyroidism, unspecified: Secondary | ICD-10-CM | POA: Diagnosis not present

## 2022-04-13 DIAGNOSIS — M1990 Unspecified osteoarthritis, unspecified site: Secondary | ICD-10-CM | POA: Diagnosis not present

## 2022-04-13 DIAGNOSIS — E785 Hyperlipidemia, unspecified: Secondary | ICD-10-CM | POA: Diagnosis not present

## 2022-04-13 DIAGNOSIS — G309 Alzheimer's disease, unspecified: Secondary | ICD-10-CM | POA: Diagnosis not present

## 2022-04-16 DIAGNOSIS — E039 Hypothyroidism, unspecified: Secondary | ICD-10-CM | POA: Diagnosis not present

## 2022-04-16 DIAGNOSIS — M1990 Unspecified osteoarthritis, unspecified site: Secondary | ICD-10-CM | POA: Diagnosis not present

## 2022-04-16 DIAGNOSIS — N3946 Mixed incontinence: Secondary | ICD-10-CM | POA: Diagnosis not present

## 2022-04-16 DIAGNOSIS — J189 Pneumonia, unspecified organism: Secondary | ICD-10-CM | POA: Diagnosis not present

## 2022-04-16 DIAGNOSIS — E785 Hyperlipidemia, unspecified: Secondary | ICD-10-CM | POA: Diagnosis not present

## 2022-04-16 DIAGNOSIS — G309 Alzheimer's disease, unspecified: Secondary | ICD-10-CM | POA: Diagnosis not present

## 2022-04-18 DIAGNOSIS — E785 Hyperlipidemia, unspecified: Secondary | ICD-10-CM | POA: Diagnosis not present

## 2022-04-18 DIAGNOSIS — J189 Pneumonia, unspecified organism: Secondary | ICD-10-CM | POA: Diagnosis not present

## 2022-04-18 DIAGNOSIS — G309 Alzheimer's disease, unspecified: Secondary | ICD-10-CM | POA: Diagnosis not present

## 2022-04-18 DIAGNOSIS — E039 Hypothyroidism, unspecified: Secondary | ICD-10-CM | POA: Diagnosis not present

## 2022-04-18 DIAGNOSIS — M1990 Unspecified osteoarthritis, unspecified site: Secondary | ICD-10-CM | POA: Diagnosis not present

## 2022-04-18 DIAGNOSIS — N3946 Mixed incontinence: Secondary | ICD-10-CM | POA: Diagnosis not present

## 2022-04-19 DIAGNOSIS — E039 Hypothyroidism, unspecified: Secondary | ICD-10-CM | POA: Diagnosis not present

## 2022-04-19 DIAGNOSIS — M1990 Unspecified osteoarthritis, unspecified site: Secondary | ICD-10-CM | POA: Diagnosis not present

## 2022-04-19 DIAGNOSIS — N3946 Mixed incontinence: Secondary | ICD-10-CM | POA: Diagnosis not present

## 2022-04-19 DIAGNOSIS — E785 Hyperlipidemia, unspecified: Secondary | ICD-10-CM | POA: Diagnosis not present

## 2022-04-19 DIAGNOSIS — J189 Pneumonia, unspecified organism: Secondary | ICD-10-CM | POA: Diagnosis not present

## 2022-04-19 DIAGNOSIS — G309 Alzheimer's disease, unspecified: Secondary | ICD-10-CM | POA: Diagnosis not present

## 2022-04-20 DIAGNOSIS — E039 Hypothyroidism, unspecified: Secondary | ICD-10-CM | POA: Diagnosis not present

## 2022-04-20 DIAGNOSIS — N3946 Mixed incontinence: Secondary | ICD-10-CM | POA: Diagnosis not present

## 2022-04-20 DIAGNOSIS — E785 Hyperlipidemia, unspecified: Secondary | ICD-10-CM | POA: Diagnosis not present

## 2022-04-20 DIAGNOSIS — J189 Pneumonia, unspecified organism: Secondary | ICD-10-CM | POA: Diagnosis not present

## 2022-04-20 DIAGNOSIS — G309 Alzheimer's disease, unspecified: Secondary | ICD-10-CM | POA: Diagnosis not present

## 2022-04-20 DIAGNOSIS — M1990 Unspecified osteoarthritis, unspecified site: Secondary | ICD-10-CM | POA: Diagnosis not present

## 2022-04-23 DIAGNOSIS — J189 Pneumonia, unspecified organism: Secondary | ICD-10-CM | POA: Diagnosis not present

## 2022-04-23 DIAGNOSIS — G309 Alzheimer's disease, unspecified: Secondary | ICD-10-CM | POA: Diagnosis not present

## 2022-04-23 DIAGNOSIS — E039 Hypothyroidism, unspecified: Secondary | ICD-10-CM | POA: Diagnosis not present

## 2022-04-23 DIAGNOSIS — N3946 Mixed incontinence: Secondary | ICD-10-CM | POA: Diagnosis not present

## 2022-04-23 DIAGNOSIS — E785 Hyperlipidemia, unspecified: Secondary | ICD-10-CM | POA: Diagnosis not present

## 2022-04-23 DIAGNOSIS — M1990 Unspecified osteoarthritis, unspecified site: Secondary | ICD-10-CM | POA: Diagnosis not present

## 2022-04-25 DIAGNOSIS — N3946 Mixed incontinence: Secondary | ICD-10-CM | POA: Diagnosis not present

## 2022-04-25 DIAGNOSIS — E785 Hyperlipidemia, unspecified: Secondary | ICD-10-CM | POA: Diagnosis not present

## 2022-04-25 DIAGNOSIS — J189 Pneumonia, unspecified organism: Secondary | ICD-10-CM | POA: Diagnosis not present

## 2022-04-25 DIAGNOSIS — G309 Alzheimer's disease, unspecified: Secondary | ICD-10-CM | POA: Diagnosis not present

## 2022-04-25 DIAGNOSIS — M1990 Unspecified osteoarthritis, unspecified site: Secondary | ICD-10-CM | POA: Diagnosis not present

## 2022-04-25 DIAGNOSIS — E039 Hypothyroidism, unspecified: Secondary | ICD-10-CM | POA: Diagnosis not present

## 2022-04-26 DIAGNOSIS — G309 Alzheimer's disease, unspecified: Secondary | ICD-10-CM | POA: Diagnosis not present

## 2022-04-26 DIAGNOSIS — N3946 Mixed incontinence: Secondary | ICD-10-CM | POA: Diagnosis not present

## 2022-04-26 DIAGNOSIS — J189 Pneumonia, unspecified organism: Secondary | ICD-10-CM | POA: Diagnosis not present

## 2022-04-26 DIAGNOSIS — E785 Hyperlipidemia, unspecified: Secondary | ICD-10-CM | POA: Diagnosis not present

## 2022-04-26 DIAGNOSIS — E039 Hypothyroidism, unspecified: Secondary | ICD-10-CM | POA: Diagnosis not present

## 2022-04-26 DIAGNOSIS — M1990 Unspecified osteoarthritis, unspecified site: Secondary | ICD-10-CM | POA: Diagnosis not present

## 2022-04-27 DIAGNOSIS — E039 Hypothyroidism, unspecified: Secondary | ICD-10-CM | POA: Diagnosis not present

## 2022-04-27 DIAGNOSIS — J189 Pneumonia, unspecified organism: Secondary | ICD-10-CM | POA: Diagnosis not present

## 2022-04-27 DIAGNOSIS — M1990 Unspecified osteoarthritis, unspecified site: Secondary | ICD-10-CM | POA: Diagnosis not present

## 2022-04-27 DIAGNOSIS — N3946 Mixed incontinence: Secondary | ICD-10-CM | POA: Diagnosis not present

## 2022-04-27 DIAGNOSIS — E785 Hyperlipidemia, unspecified: Secondary | ICD-10-CM | POA: Diagnosis not present

## 2022-04-27 DIAGNOSIS — G309 Alzheimer's disease, unspecified: Secondary | ICD-10-CM | POA: Diagnosis not present

## 2022-04-28 DIAGNOSIS — J189 Pneumonia, unspecified organism: Secondary | ICD-10-CM | POA: Diagnosis not present

## 2022-04-28 DIAGNOSIS — R531 Weakness: Secondary | ICD-10-CM | POA: Diagnosis not present

## 2022-04-28 DIAGNOSIS — K5900 Constipation, unspecified: Secondary | ICD-10-CM | POA: Diagnosis not present

## 2022-04-28 DIAGNOSIS — F419 Anxiety disorder, unspecified: Secondary | ICD-10-CM | POA: Diagnosis not present

## 2022-04-28 DIAGNOSIS — E785 Hyperlipidemia, unspecified: Secondary | ICD-10-CM | POA: Diagnosis not present

## 2022-04-28 DIAGNOSIS — E039 Hypothyroidism, unspecified: Secondary | ICD-10-CM | POA: Diagnosis not present

## 2022-04-28 DIAGNOSIS — N3946 Mixed incontinence: Secondary | ICD-10-CM | POA: Diagnosis not present

## 2022-04-28 DIAGNOSIS — R0602 Shortness of breath: Secondary | ICD-10-CM | POA: Diagnosis not present

## 2022-04-28 DIAGNOSIS — M1990 Unspecified osteoarthritis, unspecified site: Secondary | ICD-10-CM | POA: Diagnosis not present

## 2022-04-28 DIAGNOSIS — G309 Alzheimer's disease, unspecified: Secondary | ICD-10-CM | POA: Diagnosis not present

## 2022-04-28 DIAGNOSIS — R4182 Altered mental status, unspecified: Secondary | ICD-10-CM | POA: Diagnosis not present

## 2022-04-30 DIAGNOSIS — J189 Pneumonia, unspecified organism: Secondary | ICD-10-CM | POA: Diagnosis not present

## 2022-04-30 DIAGNOSIS — M1990 Unspecified osteoarthritis, unspecified site: Secondary | ICD-10-CM | POA: Diagnosis not present

## 2022-04-30 DIAGNOSIS — E785 Hyperlipidemia, unspecified: Secondary | ICD-10-CM | POA: Diagnosis not present

## 2022-04-30 DIAGNOSIS — G309 Alzheimer's disease, unspecified: Secondary | ICD-10-CM | POA: Diagnosis not present

## 2022-04-30 DIAGNOSIS — N3946 Mixed incontinence: Secondary | ICD-10-CM | POA: Diagnosis not present

## 2022-04-30 DIAGNOSIS — E039 Hypothyroidism, unspecified: Secondary | ICD-10-CM | POA: Diagnosis not present

## 2022-05-02 DIAGNOSIS — E039 Hypothyroidism, unspecified: Secondary | ICD-10-CM | POA: Diagnosis not present

## 2022-05-02 DIAGNOSIS — E785 Hyperlipidemia, unspecified: Secondary | ICD-10-CM | POA: Diagnosis not present

## 2022-05-02 DIAGNOSIS — M1990 Unspecified osteoarthritis, unspecified site: Secondary | ICD-10-CM | POA: Diagnosis not present

## 2022-05-02 DIAGNOSIS — N3946 Mixed incontinence: Secondary | ICD-10-CM | POA: Diagnosis not present

## 2022-05-02 DIAGNOSIS — G309 Alzheimer's disease, unspecified: Secondary | ICD-10-CM | POA: Diagnosis not present

## 2022-05-02 DIAGNOSIS — J189 Pneumonia, unspecified organism: Secondary | ICD-10-CM | POA: Diagnosis not present

## 2022-05-03 DIAGNOSIS — M1990 Unspecified osteoarthritis, unspecified site: Secondary | ICD-10-CM | POA: Diagnosis not present

## 2022-05-03 DIAGNOSIS — N3946 Mixed incontinence: Secondary | ICD-10-CM | POA: Diagnosis not present

## 2022-05-03 DIAGNOSIS — J189 Pneumonia, unspecified organism: Secondary | ICD-10-CM | POA: Diagnosis not present

## 2022-05-03 DIAGNOSIS — E785 Hyperlipidemia, unspecified: Secondary | ICD-10-CM | POA: Diagnosis not present

## 2022-05-03 DIAGNOSIS — G309 Alzheimer's disease, unspecified: Secondary | ICD-10-CM | POA: Diagnosis not present

## 2022-05-03 DIAGNOSIS — E039 Hypothyroidism, unspecified: Secondary | ICD-10-CM | POA: Diagnosis not present

## 2022-05-04 DIAGNOSIS — G309 Alzheimer's disease, unspecified: Secondary | ICD-10-CM | POA: Diagnosis not present

## 2022-05-04 DIAGNOSIS — N3946 Mixed incontinence: Secondary | ICD-10-CM | POA: Diagnosis not present

## 2022-05-04 DIAGNOSIS — E039 Hypothyroidism, unspecified: Secondary | ICD-10-CM | POA: Diagnosis not present

## 2022-05-04 DIAGNOSIS — J189 Pneumonia, unspecified organism: Secondary | ICD-10-CM | POA: Diagnosis not present

## 2022-05-04 DIAGNOSIS — M1990 Unspecified osteoarthritis, unspecified site: Secondary | ICD-10-CM | POA: Diagnosis not present

## 2022-05-04 DIAGNOSIS — E785 Hyperlipidemia, unspecified: Secondary | ICD-10-CM | POA: Diagnosis not present

## 2022-05-07 DIAGNOSIS — G309 Alzheimer's disease, unspecified: Secondary | ICD-10-CM | POA: Diagnosis not present

## 2022-05-07 DIAGNOSIS — M1990 Unspecified osteoarthritis, unspecified site: Secondary | ICD-10-CM | POA: Diagnosis not present

## 2022-05-07 DIAGNOSIS — N3946 Mixed incontinence: Secondary | ICD-10-CM | POA: Diagnosis not present

## 2022-05-07 DIAGNOSIS — E039 Hypothyroidism, unspecified: Secondary | ICD-10-CM | POA: Diagnosis not present

## 2022-05-07 DIAGNOSIS — E785 Hyperlipidemia, unspecified: Secondary | ICD-10-CM | POA: Diagnosis not present

## 2022-05-07 DIAGNOSIS — J189 Pneumonia, unspecified organism: Secondary | ICD-10-CM | POA: Diagnosis not present

## 2022-05-09 DIAGNOSIS — G309 Alzheimer's disease, unspecified: Secondary | ICD-10-CM | POA: Diagnosis not present

## 2022-05-09 DIAGNOSIS — E785 Hyperlipidemia, unspecified: Secondary | ICD-10-CM | POA: Diagnosis not present

## 2022-05-09 DIAGNOSIS — N3946 Mixed incontinence: Secondary | ICD-10-CM | POA: Diagnosis not present

## 2022-05-09 DIAGNOSIS — J189 Pneumonia, unspecified organism: Secondary | ICD-10-CM | POA: Diagnosis not present

## 2022-05-09 DIAGNOSIS — E039 Hypothyroidism, unspecified: Secondary | ICD-10-CM | POA: Diagnosis not present

## 2022-05-09 DIAGNOSIS — M1990 Unspecified osteoarthritis, unspecified site: Secondary | ICD-10-CM | POA: Diagnosis not present

## 2022-05-11 DIAGNOSIS — E039 Hypothyroidism, unspecified: Secondary | ICD-10-CM | POA: Diagnosis not present

## 2022-05-11 DIAGNOSIS — E785 Hyperlipidemia, unspecified: Secondary | ICD-10-CM | POA: Diagnosis not present

## 2022-05-11 DIAGNOSIS — M1990 Unspecified osteoarthritis, unspecified site: Secondary | ICD-10-CM | POA: Diagnosis not present

## 2022-05-11 DIAGNOSIS — G309 Alzheimer's disease, unspecified: Secondary | ICD-10-CM | POA: Diagnosis not present

## 2022-05-11 DIAGNOSIS — N3946 Mixed incontinence: Secondary | ICD-10-CM | POA: Diagnosis not present

## 2022-05-11 DIAGNOSIS — J189 Pneumonia, unspecified organism: Secondary | ICD-10-CM | POA: Diagnosis not present

## 2022-05-14 DIAGNOSIS — N3946 Mixed incontinence: Secondary | ICD-10-CM | POA: Diagnosis not present

## 2022-05-14 DIAGNOSIS — E785 Hyperlipidemia, unspecified: Secondary | ICD-10-CM | POA: Diagnosis not present

## 2022-05-14 DIAGNOSIS — G309 Alzheimer's disease, unspecified: Secondary | ICD-10-CM | POA: Diagnosis not present

## 2022-05-14 DIAGNOSIS — M1990 Unspecified osteoarthritis, unspecified site: Secondary | ICD-10-CM | POA: Diagnosis not present

## 2022-05-14 DIAGNOSIS — J189 Pneumonia, unspecified organism: Secondary | ICD-10-CM | POA: Diagnosis not present

## 2022-05-14 DIAGNOSIS — E039 Hypothyroidism, unspecified: Secondary | ICD-10-CM | POA: Diagnosis not present

## 2022-05-15 DIAGNOSIS — G309 Alzheimer's disease, unspecified: Secondary | ICD-10-CM | POA: Diagnosis not present

## 2022-05-15 DIAGNOSIS — E785 Hyperlipidemia, unspecified: Secondary | ICD-10-CM | POA: Diagnosis not present

## 2022-05-15 DIAGNOSIS — E039 Hypothyroidism, unspecified: Secondary | ICD-10-CM | POA: Diagnosis not present

## 2022-05-15 DIAGNOSIS — J189 Pneumonia, unspecified organism: Secondary | ICD-10-CM | POA: Diagnosis not present

## 2022-05-15 DIAGNOSIS — M1990 Unspecified osteoarthritis, unspecified site: Secondary | ICD-10-CM | POA: Diagnosis not present

## 2022-05-15 DIAGNOSIS — N3946 Mixed incontinence: Secondary | ICD-10-CM | POA: Diagnosis not present

## 2022-05-16 DIAGNOSIS — E039 Hypothyroidism, unspecified: Secondary | ICD-10-CM | POA: Diagnosis not present

## 2022-05-16 DIAGNOSIS — J189 Pneumonia, unspecified organism: Secondary | ICD-10-CM | POA: Diagnosis not present

## 2022-05-16 DIAGNOSIS — M1990 Unspecified osteoarthritis, unspecified site: Secondary | ICD-10-CM | POA: Diagnosis not present

## 2022-05-16 DIAGNOSIS — N3946 Mixed incontinence: Secondary | ICD-10-CM | POA: Diagnosis not present

## 2022-05-16 DIAGNOSIS — E785 Hyperlipidemia, unspecified: Secondary | ICD-10-CM | POA: Diagnosis not present

## 2022-05-16 DIAGNOSIS — G309 Alzheimer's disease, unspecified: Secondary | ICD-10-CM | POA: Diagnosis not present

## 2022-05-18 DIAGNOSIS — E039 Hypothyroidism, unspecified: Secondary | ICD-10-CM | POA: Diagnosis not present

## 2022-05-18 DIAGNOSIS — M1990 Unspecified osteoarthritis, unspecified site: Secondary | ICD-10-CM | POA: Diagnosis not present

## 2022-05-18 DIAGNOSIS — N3946 Mixed incontinence: Secondary | ICD-10-CM | POA: Diagnosis not present

## 2022-05-18 DIAGNOSIS — J189 Pneumonia, unspecified organism: Secondary | ICD-10-CM | POA: Diagnosis not present

## 2022-05-18 DIAGNOSIS — E785 Hyperlipidemia, unspecified: Secondary | ICD-10-CM | POA: Diagnosis not present

## 2022-05-18 DIAGNOSIS — G309 Alzheimer's disease, unspecified: Secondary | ICD-10-CM | POA: Diagnosis not present

## 2022-05-22 DIAGNOSIS — M1990 Unspecified osteoarthritis, unspecified site: Secondary | ICD-10-CM | POA: Diagnosis not present

## 2022-05-22 DIAGNOSIS — E785 Hyperlipidemia, unspecified: Secondary | ICD-10-CM | POA: Diagnosis not present

## 2022-05-22 DIAGNOSIS — E039 Hypothyroidism, unspecified: Secondary | ICD-10-CM | POA: Diagnosis not present

## 2022-05-22 DIAGNOSIS — J189 Pneumonia, unspecified organism: Secondary | ICD-10-CM | POA: Diagnosis not present

## 2022-05-22 DIAGNOSIS — G309 Alzheimer's disease, unspecified: Secondary | ICD-10-CM | POA: Diagnosis not present

## 2022-05-22 DIAGNOSIS — N3946 Mixed incontinence: Secondary | ICD-10-CM | POA: Diagnosis not present

## 2022-05-23 DIAGNOSIS — G309 Alzheimer's disease, unspecified: Secondary | ICD-10-CM | POA: Diagnosis not present

## 2022-05-23 DIAGNOSIS — J189 Pneumonia, unspecified organism: Secondary | ICD-10-CM | POA: Diagnosis not present

## 2022-05-23 DIAGNOSIS — M1990 Unspecified osteoarthritis, unspecified site: Secondary | ICD-10-CM | POA: Diagnosis not present

## 2022-05-23 DIAGNOSIS — E785 Hyperlipidemia, unspecified: Secondary | ICD-10-CM | POA: Diagnosis not present

## 2022-05-23 DIAGNOSIS — E039 Hypothyroidism, unspecified: Secondary | ICD-10-CM | POA: Diagnosis not present

## 2022-05-23 DIAGNOSIS — N3946 Mixed incontinence: Secondary | ICD-10-CM | POA: Diagnosis not present

## 2022-05-25 DIAGNOSIS — E785 Hyperlipidemia, unspecified: Secondary | ICD-10-CM | POA: Diagnosis not present

## 2022-05-25 DIAGNOSIS — J189 Pneumonia, unspecified organism: Secondary | ICD-10-CM | POA: Diagnosis not present

## 2022-05-25 DIAGNOSIS — E039 Hypothyroidism, unspecified: Secondary | ICD-10-CM | POA: Diagnosis not present

## 2022-05-25 DIAGNOSIS — G309 Alzheimer's disease, unspecified: Secondary | ICD-10-CM | POA: Diagnosis not present

## 2022-05-25 DIAGNOSIS — N3946 Mixed incontinence: Secondary | ICD-10-CM | POA: Diagnosis not present

## 2022-05-25 DIAGNOSIS — M1990 Unspecified osteoarthritis, unspecified site: Secondary | ICD-10-CM | POA: Diagnosis not present

## 2022-05-28 DIAGNOSIS — J189 Pneumonia, unspecified organism: Secondary | ICD-10-CM | POA: Diagnosis not present

## 2022-05-28 DIAGNOSIS — N3946 Mixed incontinence: Secondary | ICD-10-CM | POA: Diagnosis not present

## 2022-05-28 DIAGNOSIS — E785 Hyperlipidemia, unspecified: Secondary | ICD-10-CM | POA: Diagnosis not present

## 2022-05-28 DIAGNOSIS — E039 Hypothyroidism, unspecified: Secondary | ICD-10-CM | POA: Diagnosis not present

## 2022-05-28 DIAGNOSIS — G309 Alzheimer's disease, unspecified: Secondary | ICD-10-CM | POA: Diagnosis not present

## 2022-05-28 DIAGNOSIS — M1990 Unspecified osteoarthritis, unspecified site: Secondary | ICD-10-CM | POA: Diagnosis not present

## 2022-05-29 DIAGNOSIS — N3946 Mixed incontinence: Secondary | ICD-10-CM | POA: Diagnosis not present

## 2022-05-29 DIAGNOSIS — R531 Weakness: Secondary | ICD-10-CM | POA: Diagnosis not present

## 2022-05-29 DIAGNOSIS — R4182 Altered mental status, unspecified: Secondary | ICD-10-CM | POA: Diagnosis not present

## 2022-05-29 DIAGNOSIS — K5900 Constipation, unspecified: Secondary | ICD-10-CM | POA: Diagnosis not present

## 2022-05-29 DIAGNOSIS — E039 Hypothyroidism, unspecified: Secondary | ICD-10-CM | POA: Diagnosis not present

## 2022-05-29 DIAGNOSIS — E785 Hyperlipidemia, unspecified: Secondary | ICD-10-CM | POA: Diagnosis not present

## 2022-05-29 DIAGNOSIS — J189 Pneumonia, unspecified organism: Secondary | ICD-10-CM | POA: Diagnosis not present

## 2022-05-29 DIAGNOSIS — R0602 Shortness of breath: Secondary | ICD-10-CM | POA: Diagnosis not present

## 2022-05-29 DIAGNOSIS — F419 Anxiety disorder, unspecified: Secondary | ICD-10-CM | POA: Diagnosis not present

## 2022-05-29 DIAGNOSIS — G309 Alzheimer's disease, unspecified: Secondary | ICD-10-CM | POA: Diagnosis not present

## 2022-05-29 DIAGNOSIS — M1990 Unspecified osteoarthritis, unspecified site: Secondary | ICD-10-CM | POA: Diagnosis not present

## 2022-05-30 DIAGNOSIS — E785 Hyperlipidemia, unspecified: Secondary | ICD-10-CM | POA: Diagnosis not present

## 2022-05-30 DIAGNOSIS — N3946 Mixed incontinence: Secondary | ICD-10-CM | POA: Diagnosis not present

## 2022-05-30 DIAGNOSIS — M1990 Unspecified osteoarthritis, unspecified site: Secondary | ICD-10-CM | POA: Diagnosis not present

## 2022-05-30 DIAGNOSIS — G309 Alzheimer's disease, unspecified: Secondary | ICD-10-CM | POA: Diagnosis not present

## 2022-05-30 DIAGNOSIS — J189 Pneumonia, unspecified organism: Secondary | ICD-10-CM | POA: Diagnosis not present

## 2022-05-30 DIAGNOSIS — E039 Hypothyroidism, unspecified: Secondary | ICD-10-CM | POA: Diagnosis not present

## 2022-05-31 DIAGNOSIS — E039 Hypothyroidism, unspecified: Secondary | ICD-10-CM | POA: Diagnosis not present

## 2022-05-31 DIAGNOSIS — E785 Hyperlipidemia, unspecified: Secondary | ICD-10-CM | POA: Diagnosis not present

## 2022-05-31 DIAGNOSIS — G309 Alzheimer's disease, unspecified: Secondary | ICD-10-CM | POA: Diagnosis not present

## 2022-05-31 DIAGNOSIS — N3946 Mixed incontinence: Secondary | ICD-10-CM | POA: Diagnosis not present

## 2022-05-31 DIAGNOSIS — J189 Pneumonia, unspecified organism: Secondary | ICD-10-CM | POA: Diagnosis not present

## 2022-05-31 DIAGNOSIS — M1990 Unspecified osteoarthritis, unspecified site: Secondary | ICD-10-CM | POA: Diagnosis not present

## 2022-06-01 DIAGNOSIS — E039 Hypothyroidism, unspecified: Secondary | ICD-10-CM | POA: Diagnosis not present

## 2022-06-01 DIAGNOSIS — G309 Alzheimer's disease, unspecified: Secondary | ICD-10-CM | POA: Diagnosis not present

## 2022-06-01 DIAGNOSIS — M1990 Unspecified osteoarthritis, unspecified site: Secondary | ICD-10-CM | POA: Diagnosis not present

## 2022-06-01 DIAGNOSIS — N3946 Mixed incontinence: Secondary | ICD-10-CM | POA: Diagnosis not present

## 2022-06-01 DIAGNOSIS — E785 Hyperlipidemia, unspecified: Secondary | ICD-10-CM | POA: Diagnosis not present

## 2022-06-01 DIAGNOSIS — J189 Pneumonia, unspecified organism: Secondary | ICD-10-CM | POA: Diagnosis not present

## 2022-06-04 DIAGNOSIS — J189 Pneumonia, unspecified organism: Secondary | ICD-10-CM | POA: Diagnosis not present

## 2022-06-04 DIAGNOSIS — G309 Alzheimer's disease, unspecified: Secondary | ICD-10-CM | POA: Diagnosis not present

## 2022-06-04 DIAGNOSIS — E785 Hyperlipidemia, unspecified: Secondary | ICD-10-CM | POA: Diagnosis not present

## 2022-06-04 DIAGNOSIS — E039 Hypothyroidism, unspecified: Secondary | ICD-10-CM | POA: Diagnosis not present

## 2022-06-04 DIAGNOSIS — N3946 Mixed incontinence: Secondary | ICD-10-CM | POA: Diagnosis not present

## 2022-06-04 DIAGNOSIS — M1990 Unspecified osteoarthritis, unspecified site: Secondary | ICD-10-CM | POA: Diagnosis not present

## 2022-06-05 DIAGNOSIS — J189 Pneumonia, unspecified organism: Secondary | ICD-10-CM | POA: Diagnosis not present

## 2022-06-05 DIAGNOSIS — E039 Hypothyroidism, unspecified: Secondary | ICD-10-CM | POA: Diagnosis not present

## 2022-06-05 DIAGNOSIS — G309 Alzheimer's disease, unspecified: Secondary | ICD-10-CM | POA: Diagnosis not present

## 2022-06-05 DIAGNOSIS — M1990 Unspecified osteoarthritis, unspecified site: Secondary | ICD-10-CM | POA: Diagnosis not present

## 2022-06-05 DIAGNOSIS — E785 Hyperlipidemia, unspecified: Secondary | ICD-10-CM | POA: Diagnosis not present

## 2022-06-05 DIAGNOSIS — N3946 Mixed incontinence: Secondary | ICD-10-CM | POA: Diagnosis not present

## 2022-06-06 DIAGNOSIS — M1990 Unspecified osteoarthritis, unspecified site: Secondary | ICD-10-CM | POA: Diagnosis not present

## 2022-06-06 DIAGNOSIS — E785 Hyperlipidemia, unspecified: Secondary | ICD-10-CM | POA: Diagnosis not present

## 2022-06-06 DIAGNOSIS — G309 Alzheimer's disease, unspecified: Secondary | ICD-10-CM | POA: Diagnosis not present

## 2022-06-06 DIAGNOSIS — E039 Hypothyroidism, unspecified: Secondary | ICD-10-CM | POA: Diagnosis not present

## 2022-06-06 DIAGNOSIS — N3946 Mixed incontinence: Secondary | ICD-10-CM | POA: Diagnosis not present

## 2022-06-06 DIAGNOSIS — J189 Pneumonia, unspecified organism: Secondary | ICD-10-CM | POA: Diagnosis not present

## 2022-06-08 DIAGNOSIS — E039 Hypothyroidism, unspecified: Secondary | ICD-10-CM | POA: Diagnosis not present

## 2022-06-08 DIAGNOSIS — E785 Hyperlipidemia, unspecified: Secondary | ICD-10-CM | POA: Diagnosis not present

## 2022-06-08 DIAGNOSIS — M1990 Unspecified osteoarthritis, unspecified site: Secondary | ICD-10-CM | POA: Diagnosis not present

## 2022-06-08 DIAGNOSIS — G309 Alzheimer's disease, unspecified: Secondary | ICD-10-CM | POA: Diagnosis not present

## 2022-06-08 DIAGNOSIS — J189 Pneumonia, unspecified organism: Secondary | ICD-10-CM | POA: Diagnosis not present

## 2022-06-08 DIAGNOSIS — N3946 Mixed incontinence: Secondary | ICD-10-CM | POA: Diagnosis not present

## 2022-06-11 DIAGNOSIS — M1990 Unspecified osteoarthritis, unspecified site: Secondary | ICD-10-CM | POA: Diagnosis not present

## 2022-06-11 DIAGNOSIS — G309 Alzheimer's disease, unspecified: Secondary | ICD-10-CM | POA: Diagnosis not present

## 2022-06-11 DIAGNOSIS — E039 Hypothyroidism, unspecified: Secondary | ICD-10-CM | POA: Diagnosis not present

## 2022-06-11 DIAGNOSIS — E785 Hyperlipidemia, unspecified: Secondary | ICD-10-CM | POA: Diagnosis not present

## 2022-06-11 DIAGNOSIS — N3946 Mixed incontinence: Secondary | ICD-10-CM | POA: Diagnosis not present

## 2022-06-11 DIAGNOSIS — J189 Pneumonia, unspecified organism: Secondary | ICD-10-CM | POA: Diagnosis not present

## 2022-06-12 DIAGNOSIS — E785 Hyperlipidemia, unspecified: Secondary | ICD-10-CM | POA: Diagnosis not present

## 2022-06-12 DIAGNOSIS — N3946 Mixed incontinence: Secondary | ICD-10-CM | POA: Diagnosis not present

## 2022-06-12 DIAGNOSIS — M1990 Unspecified osteoarthritis, unspecified site: Secondary | ICD-10-CM | POA: Diagnosis not present

## 2022-06-12 DIAGNOSIS — J189 Pneumonia, unspecified organism: Secondary | ICD-10-CM | POA: Diagnosis not present

## 2022-06-12 DIAGNOSIS — E039 Hypothyroidism, unspecified: Secondary | ICD-10-CM | POA: Diagnosis not present

## 2022-06-12 DIAGNOSIS — G309 Alzheimer's disease, unspecified: Secondary | ICD-10-CM | POA: Diagnosis not present

## 2022-06-13 DIAGNOSIS — E785 Hyperlipidemia, unspecified: Secondary | ICD-10-CM | POA: Diagnosis not present

## 2022-06-13 DIAGNOSIS — N3946 Mixed incontinence: Secondary | ICD-10-CM | POA: Diagnosis not present

## 2022-06-13 DIAGNOSIS — J189 Pneumonia, unspecified organism: Secondary | ICD-10-CM | POA: Diagnosis not present

## 2022-06-13 DIAGNOSIS — G309 Alzheimer's disease, unspecified: Secondary | ICD-10-CM | POA: Diagnosis not present

## 2022-06-13 DIAGNOSIS — E039 Hypothyroidism, unspecified: Secondary | ICD-10-CM | POA: Diagnosis not present

## 2022-06-13 DIAGNOSIS — M1990 Unspecified osteoarthritis, unspecified site: Secondary | ICD-10-CM | POA: Diagnosis not present

## 2022-06-14 DIAGNOSIS — J189 Pneumonia, unspecified organism: Secondary | ICD-10-CM | POA: Diagnosis not present

## 2022-06-14 DIAGNOSIS — G309 Alzheimer's disease, unspecified: Secondary | ICD-10-CM | POA: Diagnosis not present

## 2022-06-14 DIAGNOSIS — E039 Hypothyroidism, unspecified: Secondary | ICD-10-CM | POA: Diagnosis not present

## 2022-06-14 DIAGNOSIS — E785 Hyperlipidemia, unspecified: Secondary | ICD-10-CM | POA: Diagnosis not present

## 2022-06-14 DIAGNOSIS — N3946 Mixed incontinence: Secondary | ICD-10-CM | POA: Diagnosis not present

## 2022-06-14 DIAGNOSIS — M1990 Unspecified osteoarthritis, unspecified site: Secondary | ICD-10-CM | POA: Diagnosis not present

## 2022-06-15 DIAGNOSIS — M1990 Unspecified osteoarthritis, unspecified site: Secondary | ICD-10-CM | POA: Diagnosis not present

## 2022-06-15 DIAGNOSIS — G309 Alzheimer's disease, unspecified: Secondary | ICD-10-CM | POA: Diagnosis not present

## 2022-06-15 DIAGNOSIS — J189 Pneumonia, unspecified organism: Secondary | ICD-10-CM | POA: Diagnosis not present

## 2022-06-15 DIAGNOSIS — E785 Hyperlipidemia, unspecified: Secondary | ICD-10-CM | POA: Diagnosis not present

## 2022-06-15 DIAGNOSIS — E039 Hypothyroidism, unspecified: Secondary | ICD-10-CM | POA: Diagnosis not present

## 2022-06-15 DIAGNOSIS — N3946 Mixed incontinence: Secondary | ICD-10-CM | POA: Diagnosis not present

## 2022-06-18 DIAGNOSIS — M1990 Unspecified osteoarthritis, unspecified site: Secondary | ICD-10-CM | POA: Diagnosis not present

## 2022-06-18 DIAGNOSIS — J189 Pneumonia, unspecified organism: Secondary | ICD-10-CM | POA: Diagnosis not present

## 2022-06-18 DIAGNOSIS — G309 Alzheimer's disease, unspecified: Secondary | ICD-10-CM | POA: Diagnosis not present

## 2022-06-18 DIAGNOSIS — E785 Hyperlipidemia, unspecified: Secondary | ICD-10-CM | POA: Diagnosis not present

## 2022-06-18 DIAGNOSIS — E039 Hypothyroidism, unspecified: Secondary | ICD-10-CM | POA: Diagnosis not present

## 2022-06-18 DIAGNOSIS — N3946 Mixed incontinence: Secondary | ICD-10-CM | POA: Diagnosis not present

## 2022-06-19 DIAGNOSIS — E039 Hypothyroidism, unspecified: Secondary | ICD-10-CM | POA: Diagnosis not present

## 2022-06-19 DIAGNOSIS — J189 Pneumonia, unspecified organism: Secondary | ICD-10-CM | POA: Diagnosis not present

## 2022-06-19 DIAGNOSIS — G309 Alzheimer's disease, unspecified: Secondary | ICD-10-CM | POA: Diagnosis not present

## 2022-06-19 DIAGNOSIS — E785 Hyperlipidemia, unspecified: Secondary | ICD-10-CM | POA: Diagnosis not present

## 2022-06-19 DIAGNOSIS — M1990 Unspecified osteoarthritis, unspecified site: Secondary | ICD-10-CM | POA: Diagnosis not present

## 2022-06-19 DIAGNOSIS — N3946 Mixed incontinence: Secondary | ICD-10-CM | POA: Diagnosis not present

## 2022-06-20 DIAGNOSIS — E785 Hyperlipidemia, unspecified: Secondary | ICD-10-CM | POA: Diagnosis not present

## 2022-06-20 DIAGNOSIS — J189 Pneumonia, unspecified organism: Secondary | ICD-10-CM | POA: Diagnosis not present

## 2022-06-20 DIAGNOSIS — M1990 Unspecified osteoarthritis, unspecified site: Secondary | ICD-10-CM | POA: Diagnosis not present

## 2022-06-20 DIAGNOSIS — E039 Hypothyroidism, unspecified: Secondary | ICD-10-CM | POA: Diagnosis not present

## 2022-06-20 DIAGNOSIS — G309 Alzheimer's disease, unspecified: Secondary | ICD-10-CM | POA: Diagnosis not present

## 2022-06-20 DIAGNOSIS — N3946 Mixed incontinence: Secondary | ICD-10-CM | POA: Diagnosis not present

## 2022-06-22 DIAGNOSIS — E039 Hypothyroidism, unspecified: Secondary | ICD-10-CM | POA: Diagnosis not present

## 2022-06-22 DIAGNOSIS — N3946 Mixed incontinence: Secondary | ICD-10-CM | POA: Diagnosis not present

## 2022-06-22 DIAGNOSIS — J189 Pneumonia, unspecified organism: Secondary | ICD-10-CM | POA: Diagnosis not present

## 2022-06-22 DIAGNOSIS — M1990 Unspecified osteoarthritis, unspecified site: Secondary | ICD-10-CM | POA: Diagnosis not present

## 2022-06-22 DIAGNOSIS — E785 Hyperlipidemia, unspecified: Secondary | ICD-10-CM | POA: Diagnosis not present

## 2022-06-22 DIAGNOSIS — G309 Alzheimer's disease, unspecified: Secondary | ICD-10-CM | POA: Diagnosis not present

## 2022-06-25 DIAGNOSIS — J189 Pneumonia, unspecified organism: Secondary | ICD-10-CM | POA: Diagnosis not present

## 2022-06-25 DIAGNOSIS — E039 Hypothyroidism, unspecified: Secondary | ICD-10-CM | POA: Diagnosis not present

## 2022-06-25 DIAGNOSIS — M1990 Unspecified osteoarthritis, unspecified site: Secondary | ICD-10-CM | POA: Diagnosis not present

## 2022-06-25 DIAGNOSIS — E785 Hyperlipidemia, unspecified: Secondary | ICD-10-CM | POA: Diagnosis not present

## 2022-06-25 DIAGNOSIS — G309 Alzheimer's disease, unspecified: Secondary | ICD-10-CM | POA: Diagnosis not present

## 2022-06-25 DIAGNOSIS — N3946 Mixed incontinence: Secondary | ICD-10-CM | POA: Diagnosis not present

## 2022-06-26 DIAGNOSIS — N3946 Mixed incontinence: Secondary | ICD-10-CM | POA: Diagnosis not present

## 2022-06-26 DIAGNOSIS — E039 Hypothyroidism, unspecified: Secondary | ICD-10-CM | POA: Diagnosis not present

## 2022-06-26 DIAGNOSIS — G309 Alzheimer's disease, unspecified: Secondary | ICD-10-CM | POA: Diagnosis not present

## 2022-06-26 DIAGNOSIS — J189 Pneumonia, unspecified organism: Secondary | ICD-10-CM | POA: Diagnosis not present

## 2022-06-26 DIAGNOSIS — E785 Hyperlipidemia, unspecified: Secondary | ICD-10-CM | POA: Diagnosis not present

## 2022-06-26 DIAGNOSIS — M1990 Unspecified osteoarthritis, unspecified site: Secondary | ICD-10-CM | POA: Diagnosis not present

## 2022-06-27 DIAGNOSIS — E039 Hypothyroidism, unspecified: Secondary | ICD-10-CM | POA: Diagnosis not present

## 2022-06-27 DIAGNOSIS — E785 Hyperlipidemia, unspecified: Secondary | ICD-10-CM | POA: Diagnosis not present

## 2022-06-27 DIAGNOSIS — N3946 Mixed incontinence: Secondary | ICD-10-CM | POA: Diagnosis not present

## 2022-06-27 DIAGNOSIS — M1990 Unspecified osteoarthritis, unspecified site: Secondary | ICD-10-CM | POA: Diagnosis not present

## 2022-06-27 DIAGNOSIS — J189 Pneumonia, unspecified organism: Secondary | ICD-10-CM | POA: Diagnosis not present

## 2022-06-27 DIAGNOSIS — G309 Alzheimer's disease, unspecified: Secondary | ICD-10-CM | POA: Diagnosis not present

## 2022-06-29 DIAGNOSIS — E039 Hypothyroidism, unspecified: Secondary | ICD-10-CM | POA: Diagnosis not present

## 2022-06-29 DIAGNOSIS — R0602 Shortness of breath: Secondary | ICD-10-CM | POA: Diagnosis not present

## 2022-06-29 DIAGNOSIS — R531 Weakness: Secondary | ICD-10-CM | POA: Diagnosis not present

## 2022-06-29 DIAGNOSIS — E785 Hyperlipidemia, unspecified: Secondary | ICD-10-CM | POA: Diagnosis not present

## 2022-06-29 DIAGNOSIS — J189 Pneumonia, unspecified organism: Secondary | ICD-10-CM | POA: Diagnosis not present

## 2022-06-29 DIAGNOSIS — M1990 Unspecified osteoarthritis, unspecified site: Secondary | ICD-10-CM | POA: Diagnosis not present

## 2022-06-29 DIAGNOSIS — N3946 Mixed incontinence: Secondary | ICD-10-CM | POA: Diagnosis not present

## 2022-06-29 DIAGNOSIS — R4182 Altered mental status, unspecified: Secondary | ICD-10-CM | POA: Diagnosis not present

## 2022-06-29 DIAGNOSIS — F419 Anxiety disorder, unspecified: Secondary | ICD-10-CM | POA: Diagnosis not present

## 2022-06-29 DIAGNOSIS — K5900 Constipation, unspecified: Secondary | ICD-10-CM | POA: Diagnosis not present

## 2022-06-29 DIAGNOSIS — G309 Alzheimer's disease, unspecified: Secondary | ICD-10-CM | POA: Diagnosis not present

## 2022-07-03 DIAGNOSIS — J189 Pneumonia, unspecified organism: Secondary | ICD-10-CM | POA: Diagnosis not present

## 2022-07-03 DIAGNOSIS — M1990 Unspecified osteoarthritis, unspecified site: Secondary | ICD-10-CM | POA: Diagnosis not present

## 2022-07-03 DIAGNOSIS — E785 Hyperlipidemia, unspecified: Secondary | ICD-10-CM | POA: Diagnosis not present

## 2022-07-03 DIAGNOSIS — E039 Hypothyroidism, unspecified: Secondary | ICD-10-CM | POA: Diagnosis not present

## 2022-07-03 DIAGNOSIS — G309 Alzheimer's disease, unspecified: Secondary | ICD-10-CM | POA: Diagnosis not present

## 2022-07-03 DIAGNOSIS — N3946 Mixed incontinence: Secondary | ICD-10-CM | POA: Diagnosis not present

## 2022-07-04 DIAGNOSIS — J189 Pneumonia, unspecified organism: Secondary | ICD-10-CM | POA: Diagnosis not present

## 2022-07-04 DIAGNOSIS — E785 Hyperlipidemia, unspecified: Secondary | ICD-10-CM | POA: Diagnosis not present

## 2022-07-04 DIAGNOSIS — E039 Hypothyroidism, unspecified: Secondary | ICD-10-CM | POA: Diagnosis not present

## 2022-07-04 DIAGNOSIS — M1990 Unspecified osteoarthritis, unspecified site: Secondary | ICD-10-CM | POA: Diagnosis not present

## 2022-07-04 DIAGNOSIS — G309 Alzheimer's disease, unspecified: Secondary | ICD-10-CM | POA: Diagnosis not present

## 2022-07-04 DIAGNOSIS — N3946 Mixed incontinence: Secondary | ICD-10-CM | POA: Diagnosis not present

## 2022-07-05 DIAGNOSIS — E039 Hypothyroidism, unspecified: Secondary | ICD-10-CM | POA: Diagnosis not present

## 2022-07-05 DIAGNOSIS — N3946 Mixed incontinence: Secondary | ICD-10-CM | POA: Diagnosis not present

## 2022-07-05 DIAGNOSIS — E785 Hyperlipidemia, unspecified: Secondary | ICD-10-CM | POA: Diagnosis not present

## 2022-07-05 DIAGNOSIS — J189 Pneumonia, unspecified organism: Secondary | ICD-10-CM | POA: Diagnosis not present

## 2022-07-05 DIAGNOSIS — M1990 Unspecified osteoarthritis, unspecified site: Secondary | ICD-10-CM | POA: Diagnosis not present

## 2022-07-05 DIAGNOSIS — G309 Alzheimer's disease, unspecified: Secondary | ICD-10-CM | POA: Diagnosis not present

## 2022-07-06 DIAGNOSIS — G309 Alzheimer's disease, unspecified: Secondary | ICD-10-CM | POA: Diagnosis not present

## 2022-07-06 DIAGNOSIS — E039 Hypothyroidism, unspecified: Secondary | ICD-10-CM | POA: Diagnosis not present

## 2022-07-06 DIAGNOSIS — N3946 Mixed incontinence: Secondary | ICD-10-CM | POA: Diagnosis not present

## 2022-07-06 DIAGNOSIS — M1990 Unspecified osteoarthritis, unspecified site: Secondary | ICD-10-CM | POA: Diagnosis not present

## 2022-07-06 DIAGNOSIS — J189 Pneumonia, unspecified organism: Secondary | ICD-10-CM | POA: Diagnosis not present

## 2022-07-06 DIAGNOSIS — E785 Hyperlipidemia, unspecified: Secondary | ICD-10-CM | POA: Diagnosis not present

## 2022-07-09 DIAGNOSIS — G309 Alzheimer's disease, unspecified: Secondary | ICD-10-CM | POA: Diagnosis not present

## 2022-07-09 DIAGNOSIS — M1990 Unspecified osteoarthritis, unspecified site: Secondary | ICD-10-CM | POA: Diagnosis not present

## 2022-07-09 DIAGNOSIS — J189 Pneumonia, unspecified organism: Secondary | ICD-10-CM | POA: Diagnosis not present

## 2022-07-09 DIAGNOSIS — E039 Hypothyroidism, unspecified: Secondary | ICD-10-CM | POA: Diagnosis not present

## 2022-07-09 DIAGNOSIS — E785 Hyperlipidemia, unspecified: Secondary | ICD-10-CM | POA: Diagnosis not present

## 2022-07-09 DIAGNOSIS — N3946 Mixed incontinence: Secondary | ICD-10-CM | POA: Diagnosis not present

## 2022-07-10 DIAGNOSIS — M1990 Unspecified osteoarthritis, unspecified site: Secondary | ICD-10-CM | POA: Diagnosis not present

## 2022-07-10 DIAGNOSIS — N3946 Mixed incontinence: Secondary | ICD-10-CM | POA: Diagnosis not present

## 2022-07-10 DIAGNOSIS — E039 Hypothyroidism, unspecified: Secondary | ICD-10-CM | POA: Diagnosis not present

## 2022-07-10 DIAGNOSIS — J189 Pneumonia, unspecified organism: Secondary | ICD-10-CM | POA: Diagnosis not present

## 2022-07-10 DIAGNOSIS — E785 Hyperlipidemia, unspecified: Secondary | ICD-10-CM | POA: Diagnosis not present

## 2022-07-10 DIAGNOSIS — G309 Alzheimer's disease, unspecified: Secondary | ICD-10-CM | POA: Diagnosis not present

## 2022-07-11 DIAGNOSIS — M1990 Unspecified osteoarthritis, unspecified site: Secondary | ICD-10-CM | POA: Diagnosis not present

## 2022-07-11 DIAGNOSIS — N3946 Mixed incontinence: Secondary | ICD-10-CM | POA: Diagnosis not present

## 2022-07-11 DIAGNOSIS — G309 Alzheimer's disease, unspecified: Secondary | ICD-10-CM | POA: Diagnosis not present

## 2022-07-11 DIAGNOSIS — E039 Hypothyroidism, unspecified: Secondary | ICD-10-CM | POA: Diagnosis not present

## 2022-07-11 DIAGNOSIS — J189 Pneumonia, unspecified organism: Secondary | ICD-10-CM | POA: Diagnosis not present

## 2022-07-11 DIAGNOSIS — E785 Hyperlipidemia, unspecified: Secondary | ICD-10-CM | POA: Diagnosis not present

## 2022-07-12 DIAGNOSIS — J189 Pneumonia, unspecified organism: Secondary | ICD-10-CM | POA: Diagnosis not present

## 2022-07-12 DIAGNOSIS — G309 Alzheimer's disease, unspecified: Secondary | ICD-10-CM | POA: Diagnosis not present

## 2022-07-12 DIAGNOSIS — E039 Hypothyroidism, unspecified: Secondary | ICD-10-CM | POA: Diagnosis not present

## 2022-07-12 DIAGNOSIS — E785 Hyperlipidemia, unspecified: Secondary | ICD-10-CM | POA: Diagnosis not present

## 2022-07-12 DIAGNOSIS — N3946 Mixed incontinence: Secondary | ICD-10-CM | POA: Diagnosis not present

## 2022-07-12 DIAGNOSIS — M1990 Unspecified osteoarthritis, unspecified site: Secondary | ICD-10-CM | POA: Diagnosis not present

## 2022-07-16 DIAGNOSIS — J189 Pneumonia, unspecified organism: Secondary | ICD-10-CM | POA: Diagnosis not present

## 2022-07-16 DIAGNOSIS — N3946 Mixed incontinence: Secondary | ICD-10-CM | POA: Diagnosis not present

## 2022-07-16 DIAGNOSIS — E039 Hypothyroidism, unspecified: Secondary | ICD-10-CM | POA: Diagnosis not present

## 2022-07-16 DIAGNOSIS — G309 Alzheimer's disease, unspecified: Secondary | ICD-10-CM | POA: Diagnosis not present

## 2022-07-16 DIAGNOSIS — E785 Hyperlipidemia, unspecified: Secondary | ICD-10-CM | POA: Diagnosis not present

## 2022-07-16 DIAGNOSIS — M1990 Unspecified osteoarthritis, unspecified site: Secondary | ICD-10-CM | POA: Diagnosis not present

## 2022-07-17 DIAGNOSIS — J189 Pneumonia, unspecified organism: Secondary | ICD-10-CM | POA: Diagnosis not present

## 2022-07-17 DIAGNOSIS — E039 Hypothyroidism, unspecified: Secondary | ICD-10-CM | POA: Diagnosis not present

## 2022-07-17 DIAGNOSIS — E785 Hyperlipidemia, unspecified: Secondary | ICD-10-CM | POA: Diagnosis not present

## 2022-07-17 DIAGNOSIS — N3946 Mixed incontinence: Secondary | ICD-10-CM | POA: Diagnosis not present

## 2022-07-17 DIAGNOSIS — M1990 Unspecified osteoarthritis, unspecified site: Secondary | ICD-10-CM | POA: Diagnosis not present

## 2022-07-17 DIAGNOSIS — G309 Alzheimer's disease, unspecified: Secondary | ICD-10-CM | POA: Diagnosis not present

## 2022-07-18 DIAGNOSIS — M1990 Unspecified osteoarthritis, unspecified site: Secondary | ICD-10-CM | POA: Diagnosis not present

## 2022-07-18 DIAGNOSIS — E039 Hypothyroidism, unspecified: Secondary | ICD-10-CM | POA: Diagnosis not present

## 2022-07-18 DIAGNOSIS — E785 Hyperlipidemia, unspecified: Secondary | ICD-10-CM | POA: Diagnosis not present

## 2022-07-18 DIAGNOSIS — G309 Alzheimer's disease, unspecified: Secondary | ICD-10-CM | POA: Diagnosis not present

## 2022-07-18 DIAGNOSIS — N3946 Mixed incontinence: Secondary | ICD-10-CM | POA: Diagnosis not present

## 2022-07-18 DIAGNOSIS — J189 Pneumonia, unspecified organism: Secondary | ICD-10-CM | POA: Diagnosis not present

## 2022-07-20 DIAGNOSIS — N3946 Mixed incontinence: Secondary | ICD-10-CM | POA: Diagnosis not present

## 2022-07-20 DIAGNOSIS — J189 Pneumonia, unspecified organism: Secondary | ICD-10-CM | POA: Diagnosis not present

## 2022-07-20 DIAGNOSIS — M1990 Unspecified osteoarthritis, unspecified site: Secondary | ICD-10-CM | POA: Diagnosis not present

## 2022-07-20 DIAGNOSIS — E785 Hyperlipidemia, unspecified: Secondary | ICD-10-CM | POA: Diagnosis not present

## 2022-07-20 DIAGNOSIS — G309 Alzheimer's disease, unspecified: Secondary | ICD-10-CM | POA: Diagnosis not present

## 2022-07-20 DIAGNOSIS — E039 Hypothyroidism, unspecified: Secondary | ICD-10-CM | POA: Diagnosis not present

## 2022-07-23 DIAGNOSIS — J189 Pneumonia, unspecified organism: Secondary | ICD-10-CM | POA: Diagnosis not present

## 2022-07-23 DIAGNOSIS — M1990 Unspecified osteoarthritis, unspecified site: Secondary | ICD-10-CM | POA: Diagnosis not present

## 2022-07-23 DIAGNOSIS — N3946 Mixed incontinence: Secondary | ICD-10-CM | POA: Diagnosis not present

## 2022-07-23 DIAGNOSIS — G309 Alzheimer's disease, unspecified: Secondary | ICD-10-CM | POA: Diagnosis not present

## 2022-07-23 DIAGNOSIS — E039 Hypothyroidism, unspecified: Secondary | ICD-10-CM | POA: Diagnosis not present

## 2022-07-23 DIAGNOSIS — E785 Hyperlipidemia, unspecified: Secondary | ICD-10-CM | POA: Diagnosis not present

## 2022-07-24 DIAGNOSIS — E785 Hyperlipidemia, unspecified: Secondary | ICD-10-CM | POA: Diagnosis not present

## 2022-07-24 DIAGNOSIS — G309 Alzheimer's disease, unspecified: Secondary | ICD-10-CM | POA: Diagnosis not present

## 2022-07-24 DIAGNOSIS — E039 Hypothyroidism, unspecified: Secondary | ICD-10-CM | POA: Diagnosis not present

## 2022-07-24 DIAGNOSIS — J189 Pneumonia, unspecified organism: Secondary | ICD-10-CM | POA: Diagnosis not present

## 2022-07-24 DIAGNOSIS — N3946 Mixed incontinence: Secondary | ICD-10-CM | POA: Diagnosis not present

## 2022-07-24 DIAGNOSIS — M1990 Unspecified osteoarthritis, unspecified site: Secondary | ICD-10-CM | POA: Diagnosis not present

## 2022-07-25 DIAGNOSIS — G309 Alzheimer's disease, unspecified: Secondary | ICD-10-CM | POA: Diagnosis not present

## 2022-07-25 DIAGNOSIS — M1990 Unspecified osteoarthritis, unspecified site: Secondary | ICD-10-CM | POA: Diagnosis not present

## 2022-07-25 DIAGNOSIS — N3946 Mixed incontinence: Secondary | ICD-10-CM | POA: Diagnosis not present

## 2022-07-25 DIAGNOSIS — J189 Pneumonia, unspecified organism: Secondary | ICD-10-CM | POA: Diagnosis not present

## 2022-07-25 DIAGNOSIS — E039 Hypothyroidism, unspecified: Secondary | ICD-10-CM | POA: Diagnosis not present

## 2022-07-25 DIAGNOSIS — E785 Hyperlipidemia, unspecified: Secondary | ICD-10-CM | POA: Diagnosis not present

## 2022-07-26 DIAGNOSIS — E039 Hypothyroidism, unspecified: Secondary | ICD-10-CM | POA: Diagnosis not present

## 2022-07-26 DIAGNOSIS — J189 Pneumonia, unspecified organism: Secondary | ICD-10-CM | POA: Diagnosis not present

## 2022-07-26 DIAGNOSIS — G309 Alzheimer's disease, unspecified: Secondary | ICD-10-CM | POA: Diagnosis not present

## 2022-07-26 DIAGNOSIS — E785 Hyperlipidemia, unspecified: Secondary | ICD-10-CM | POA: Diagnosis not present

## 2022-07-26 DIAGNOSIS — N3946 Mixed incontinence: Secondary | ICD-10-CM | POA: Diagnosis not present

## 2022-07-26 DIAGNOSIS — M1990 Unspecified osteoarthritis, unspecified site: Secondary | ICD-10-CM | POA: Diagnosis not present

## 2022-07-27 DIAGNOSIS — J189 Pneumonia, unspecified organism: Secondary | ICD-10-CM | POA: Diagnosis not present

## 2022-07-27 DIAGNOSIS — M1990 Unspecified osteoarthritis, unspecified site: Secondary | ICD-10-CM | POA: Diagnosis not present

## 2022-07-27 DIAGNOSIS — N3946 Mixed incontinence: Secondary | ICD-10-CM | POA: Diagnosis not present

## 2022-07-27 DIAGNOSIS — E039 Hypothyroidism, unspecified: Secondary | ICD-10-CM | POA: Diagnosis not present

## 2022-07-27 DIAGNOSIS — E785 Hyperlipidemia, unspecified: Secondary | ICD-10-CM | POA: Diagnosis not present

## 2022-07-27 DIAGNOSIS — G309 Alzheimer's disease, unspecified: Secondary | ICD-10-CM | POA: Diagnosis not present

## 2022-07-29 DIAGNOSIS — F419 Anxiety disorder, unspecified: Secondary | ICD-10-CM | POA: Diagnosis not present

## 2022-07-29 DIAGNOSIS — N3946 Mixed incontinence: Secondary | ICD-10-CM | POA: Diagnosis not present

## 2022-07-29 DIAGNOSIS — E039 Hypothyroidism, unspecified: Secondary | ICD-10-CM | POA: Diagnosis not present

## 2022-07-29 DIAGNOSIS — R531 Weakness: Secondary | ICD-10-CM | POA: Diagnosis not present

## 2022-07-29 DIAGNOSIS — G309 Alzheimer's disease, unspecified: Secondary | ICD-10-CM | POA: Diagnosis not present

## 2022-07-29 DIAGNOSIS — R4182 Altered mental status, unspecified: Secondary | ICD-10-CM | POA: Diagnosis not present

## 2022-07-29 DIAGNOSIS — K5900 Constipation, unspecified: Secondary | ICD-10-CM | POA: Diagnosis not present

## 2022-07-29 DIAGNOSIS — M1990 Unspecified osteoarthritis, unspecified site: Secondary | ICD-10-CM | POA: Diagnosis not present

## 2022-07-29 DIAGNOSIS — E785 Hyperlipidemia, unspecified: Secondary | ICD-10-CM | POA: Diagnosis not present

## 2022-07-29 DIAGNOSIS — J189 Pneumonia, unspecified organism: Secondary | ICD-10-CM | POA: Diagnosis not present

## 2022-07-29 DIAGNOSIS — R0602 Shortness of breath: Secondary | ICD-10-CM | POA: Diagnosis not present

## 2022-07-30 DIAGNOSIS — E039 Hypothyroidism, unspecified: Secondary | ICD-10-CM | POA: Diagnosis not present

## 2022-07-30 DIAGNOSIS — E785 Hyperlipidemia, unspecified: Secondary | ICD-10-CM | POA: Diagnosis not present

## 2022-07-30 DIAGNOSIS — G309 Alzheimer's disease, unspecified: Secondary | ICD-10-CM | POA: Diagnosis not present

## 2022-07-30 DIAGNOSIS — J189 Pneumonia, unspecified organism: Secondary | ICD-10-CM | POA: Diagnosis not present

## 2022-07-30 DIAGNOSIS — M1990 Unspecified osteoarthritis, unspecified site: Secondary | ICD-10-CM | POA: Diagnosis not present

## 2022-07-30 DIAGNOSIS — N3946 Mixed incontinence: Secondary | ICD-10-CM | POA: Diagnosis not present

## 2022-07-31 DIAGNOSIS — E039 Hypothyroidism, unspecified: Secondary | ICD-10-CM | POA: Diagnosis not present

## 2022-07-31 DIAGNOSIS — M1990 Unspecified osteoarthritis, unspecified site: Secondary | ICD-10-CM | POA: Diagnosis not present

## 2022-07-31 DIAGNOSIS — N3946 Mixed incontinence: Secondary | ICD-10-CM | POA: Diagnosis not present

## 2022-07-31 DIAGNOSIS — G309 Alzheimer's disease, unspecified: Secondary | ICD-10-CM | POA: Diagnosis not present

## 2022-07-31 DIAGNOSIS — J189 Pneumonia, unspecified organism: Secondary | ICD-10-CM | POA: Diagnosis not present

## 2022-07-31 DIAGNOSIS — E785 Hyperlipidemia, unspecified: Secondary | ICD-10-CM | POA: Diagnosis not present

## 2022-08-01 DIAGNOSIS — M1990 Unspecified osteoarthritis, unspecified site: Secondary | ICD-10-CM | POA: Diagnosis not present

## 2022-08-01 DIAGNOSIS — E039 Hypothyroidism, unspecified: Secondary | ICD-10-CM | POA: Diagnosis not present

## 2022-08-01 DIAGNOSIS — G309 Alzheimer's disease, unspecified: Secondary | ICD-10-CM | POA: Diagnosis not present

## 2022-08-01 DIAGNOSIS — J189 Pneumonia, unspecified organism: Secondary | ICD-10-CM | POA: Diagnosis not present

## 2022-08-01 DIAGNOSIS — N3946 Mixed incontinence: Secondary | ICD-10-CM | POA: Diagnosis not present

## 2022-08-01 DIAGNOSIS — E785 Hyperlipidemia, unspecified: Secondary | ICD-10-CM | POA: Diagnosis not present

## 2022-08-06 DIAGNOSIS — M1990 Unspecified osteoarthritis, unspecified site: Secondary | ICD-10-CM | POA: Diagnosis not present

## 2022-08-06 DIAGNOSIS — E785 Hyperlipidemia, unspecified: Secondary | ICD-10-CM | POA: Diagnosis not present

## 2022-08-06 DIAGNOSIS — J189 Pneumonia, unspecified organism: Secondary | ICD-10-CM | POA: Diagnosis not present

## 2022-08-06 DIAGNOSIS — G309 Alzheimer's disease, unspecified: Secondary | ICD-10-CM | POA: Diagnosis not present

## 2022-08-06 DIAGNOSIS — E039 Hypothyroidism, unspecified: Secondary | ICD-10-CM | POA: Diagnosis not present

## 2022-08-06 DIAGNOSIS — N3946 Mixed incontinence: Secondary | ICD-10-CM | POA: Diagnosis not present

## 2022-08-07 DIAGNOSIS — N3946 Mixed incontinence: Secondary | ICD-10-CM | POA: Diagnosis not present

## 2022-08-07 DIAGNOSIS — J189 Pneumonia, unspecified organism: Secondary | ICD-10-CM | POA: Diagnosis not present

## 2022-08-07 DIAGNOSIS — E785 Hyperlipidemia, unspecified: Secondary | ICD-10-CM | POA: Diagnosis not present

## 2022-08-07 DIAGNOSIS — M1990 Unspecified osteoarthritis, unspecified site: Secondary | ICD-10-CM | POA: Diagnosis not present

## 2022-08-07 DIAGNOSIS — G309 Alzheimer's disease, unspecified: Secondary | ICD-10-CM | POA: Diagnosis not present

## 2022-08-07 DIAGNOSIS — E039 Hypothyroidism, unspecified: Secondary | ICD-10-CM | POA: Diagnosis not present

## 2022-08-08 DIAGNOSIS — E039 Hypothyroidism, unspecified: Secondary | ICD-10-CM | POA: Diagnosis not present

## 2022-08-08 DIAGNOSIS — N3946 Mixed incontinence: Secondary | ICD-10-CM | POA: Diagnosis not present

## 2022-08-08 DIAGNOSIS — J189 Pneumonia, unspecified organism: Secondary | ICD-10-CM | POA: Diagnosis not present

## 2022-08-08 DIAGNOSIS — E785 Hyperlipidemia, unspecified: Secondary | ICD-10-CM | POA: Diagnosis not present

## 2022-08-08 DIAGNOSIS — G309 Alzheimer's disease, unspecified: Secondary | ICD-10-CM | POA: Diagnosis not present

## 2022-08-08 DIAGNOSIS — M1990 Unspecified osteoarthritis, unspecified site: Secondary | ICD-10-CM | POA: Diagnosis not present

## 2022-08-10 DIAGNOSIS — E785 Hyperlipidemia, unspecified: Secondary | ICD-10-CM | POA: Diagnosis not present

## 2022-08-10 DIAGNOSIS — M1990 Unspecified osteoarthritis, unspecified site: Secondary | ICD-10-CM | POA: Diagnosis not present

## 2022-08-10 DIAGNOSIS — E039 Hypothyroidism, unspecified: Secondary | ICD-10-CM | POA: Diagnosis not present

## 2022-08-10 DIAGNOSIS — G309 Alzheimer's disease, unspecified: Secondary | ICD-10-CM | POA: Diagnosis not present

## 2022-08-10 DIAGNOSIS — J189 Pneumonia, unspecified organism: Secondary | ICD-10-CM | POA: Diagnosis not present

## 2022-08-10 DIAGNOSIS — N3946 Mixed incontinence: Secondary | ICD-10-CM | POA: Diagnosis not present

## 2022-08-13 DIAGNOSIS — J189 Pneumonia, unspecified organism: Secondary | ICD-10-CM | POA: Diagnosis not present

## 2022-08-13 DIAGNOSIS — E039 Hypothyroidism, unspecified: Secondary | ICD-10-CM | POA: Diagnosis not present

## 2022-08-13 DIAGNOSIS — E785 Hyperlipidemia, unspecified: Secondary | ICD-10-CM | POA: Diagnosis not present

## 2022-08-13 DIAGNOSIS — N3946 Mixed incontinence: Secondary | ICD-10-CM | POA: Diagnosis not present

## 2022-08-13 DIAGNOSIS — M1990 Unspecified osteoarthritis, unspecified site: Secondary | ICD-10-CM | POA: Diagnosis not present

## 2022-08-13 DIAGNOSIS — G309 Alzheimer's disease, unspecified: Secondary | ICD-10-CM | POA: Diagnosis not present

## 2022-08-14 DIAGNOSIS — J189 Pneumonia, unspecified organism: Secondary | ICD-10-CM | POA: Diagnosis not present

## 2022-08-14 DIAGNOSIS — E785 Hyperlipidemia, unspecified: Secondary | ICD-10-CM | POA: Diagnosis not present

## 2022-08-14 DIAGNOSIS — M1990 Unspecified osteoarthritis, unspecified site: Secondary | ICD-10-CM | POA: Diagnosis not present

## 2022-08-14 DIAGNOSIS — G309 Alzheimer's disease, unspecified: Secondary | ICD-10-CM | POA: Diagnosis not present

## 2022-08-14 DIAGNOSIS — E039 Hypothyroidism, unspecified: Secondary | ICD-10-CM | POA: Diagnosis not present

## 2022-08-14 DIAGNOSIS — N3946 Mixed incontinence: Secondary | ICD-10-CM | POA: Diagnosis not present

## 2022-08-15 DIAGNOSIS — G309 Alzheimer's disease, unspecified: Secondary | ICD-10-CM | POA: Diagnosis not present

## 2022-08-15 DIAGNOSIS — M1990 Unspecified osteoarthritis, unspecified site: Secondary | ICD-10-CM | POA: Diagnosis not present

## 2022-08-15 DIAGNOSIS — J189 Pneumonia, unspecified organism: Secondary | ICD-10-CM | POA: Diagnosis not present

## 2022-08-15 DIAGNOSIS — N3946 Mixed incontinence: Secondary | ICD-10-CM | POA: Diagnosis not present

## 2022-08-15 DIAGNOSIS — E785 Hyperlipidemia, unspecified: Secondary | ICD-10-CM | POA: Diagnosis not present

## 2022-08-15 DIAGNOSIS — E039 Hypothyroidism, unspecified: Secondary | ICD-10-CM | POA: Diagnosis not present

## 2022-08-16 DIAGNOSIS — E039 Hypothyroidism, unspecified: Secondary | ICD-10-CM | POA: Diagnosis not present

## 2022-08-16 DIAGNOSIS — M1990 Unspecified osteoarthritis, unspecified site: Secondary | ICD-10-CM | POA: Diagnosis not present

## 2022-08-16 DIAGNOSIS — N3946 Mixed incontinence: Secondary | ICD-10-CM | POA: Diagnosis not present

## 2022-08-16 DIAGNOSIS — E785 Hyperlipidemia, unspecified: Secondary | ICD-10-CM | POA: Diagnosis not present

## 2022-08-16 DIAGNOSIS — J189 Pneumonia, unspecified organism: Secondary | ICD-10-CM | POA: Diagnosis not present

## 2022-08-16 DIAGNOSIS — G309 Alzheimer's disease, unspecified: Secondary | ICD-10-CM | POA: Diagnosis not present

## 2022-08-17 DIAGNOSIS — G309 Alzheimer's disease, unspecified: Secondary | ICD-10-CM | POA: Diagnosis not present

## 2022-08-17 DIAGNOSIS — J189 Pneumonia, unspecified organism: Secondary | ICD-10-CM | POA: Diagnosis not present

## 2022-08-17 DIAGNOSIS — N3946 Mixed incontinence: Secondary | ICD-10-CM | POA: Diagnosis not present

## 2022-08-17 DIAGNOSIS — E039 Hypothyroidism, unspecified: Secondary | ICD-10-CM | POA: Diagnosis not present

## 2022-08-17 DIAGNOSIS — E785 Hyperlipidemia, unspecified: Secondary | ICD-10-CM | POA: Diagnosis not present

## 2022-08-17 DIAGNOSIS — M1990 Unspecified osteoarthritis, unspecified site: Secondary | ICD-10-CM | POA: Diagnosis not present

## 2022-08-20 DIAGNOSIS — E785 Hyperlipidemia, unspecified: Secondary | ICD-10-CM | POA: Diagnosis not present

## 2022-08-20 DIAGNOSIS — M1990 Unspecified osteoarthritis, unspecified site: Secondary | ICD-10-CM | POA: Diagnosis not present

## 2022-08-20 DIAGNOSIS — E039 Hypothyroidism, unspecified: Secondary | ICD-10-CM | POA: Diagnosis not present

## 2022-08-20 DIAGNOSIS — G309 Alzheimer's disease, unspecified: Secondary | ICD-10-CM | POA: Diagnosis not present

## 2022-08-20 DIAGNOSIS — N3946 Mixed incontinence: Secondary | ICD-10-CM | POA: Diagnosis not present

## 2022-08-20 DIAGNOSIS — J189 Pneumonia, unspecified organism: Secondary | ICD-10-CM | POA: Diagnosis not present

## 2022-08-21 DIAGNOSIS — M1990 Unspecified osteoarthritis, unspecified site: Secondary | ICD-10-CM | POA: Diagnosis not present

## 2022-08-21 DIAGNOSIS — G309 Alzheimer's disease, unspecified: Secondary | ICD-10-CM | POA: Diagnosis not present

## 2022-08-21 DIAGNOSIS — J189 Pneumonia, unspecified organism: Secondary | ICD-10-CM | POA: Diagnosis not present

## 2022-08-21 DIAGNOSIS — N3946 Mixed incontinence: Secondary | ICD-10-CM | POA: Diagnosis not present

## 2022-08-21 DIAGNOSIS — E785 Hyperlipidemia, unspecified: Secondary | ICD-10-CM | POA: Diagnosis not present

## 2022-08-21 DIAGNOSIS — E039 Hypothyroidism, unspecified: Secondary | ICD-10-CM | POA: Diagnosis not present

## 2022-08-22 DIAGNOSIS — E039 Hypothyroidism, unspecified: Secondary | ICD-10-CM | POA: Diagnosis not present

## 2022-08-22 DIAGNOSIS — E785 Hyperlipidemia, unspecified: Secondary | ICD-10-CM | POA: Diagnosis not present

## 2022-08-22 DIAGNOSIS — N3946 Mixed incontinence: Secondary | ICD-10-CM | POA: Diagnosis not present

## 2022-08-22 DIAGNOSIS — G309 Alzheimer's disease, unspecified: Secondary | ICD-10-CM | POA: Diagnosis not present

## 2022-08-22 DIAGNOSIS — M1990 Unspecified osteoarthritis, unspecified site: Secondary | ICD-10-CM | POA: Diagnosis not present

## 2022-08-22 DIAGNOSIS — J189 Pneumonia, unspecified organism: Secondary | ICD-10-CM | POA: Diagnosis not present

## 2022-08-24 DIAGNOSIS — J189 Pneumonia, unspecified organism: Secondary | ICD-10-CM | POA: Diagnosis not present

## 2022-08-24 DIAGNOSIS — E785 Hyperlipidemia, unspecified: Secondary | ICD-10-CM | POA: Diagnosis not present

## 2022-08-24 DIAGNOSIS — G309 Alzheimer's disease, unspecified: Secondary | ICD-10-CM | POA: Diagnosis not present

## 2022-08-24 DIAGNOSIS — M1990 Unspecified osteoarthritis, unspecified site: Secondary | ICD-10-CM | POA: Diagnosis not present

## 2022-08-24 DIAGNOSIS — E039 Hypothyroidism, unspecified: Secondary | ICD-10-CM | POA: Diagnosis not present

## 2022-08-24 DIAGNOSIS — N3946 Mixed incontinence: Secondary | ICD-10-CM | POA: Diagnosis not present

## 2022-08-27 DIAGNOSIS — J189 Pneumonia, unspecified organism: Secondary | ICD-10-CM | POA: Diagnosis not present

## 2022-08-27 DIAGNOSIS — E039 Hypothyroidism, unspecified: Secondary | ICD-10-CM | POA: Diagnosis not present

## 2022-08-27 DIAGNOSIS — M1990 Unspecified osteoarthritis, unspecified site: Secondary | ICD-10-CM | POA: Diagnosis not present

## 2022-08-27 DIAGNOSIS — G309 Alzheimer's disease, unspecified: Secondary | ICD-10-CM | POA: Diagnosis not present

## 2022-08-27 DIAGNOSIS — N3946 Mixed incontinence: Secondary | ICD-10-CM | POA: Diagnosis not present

## 2022-08-27 DIAGNOSIS — E785 Hyperlipidemia, unspecified: Secondary | ICD-10-CM | POA: Diagnosis not present

## 2022-08-28 DIAGNOSIS — E785 Hyperlipidemia, unspecified: Secondary | ICD-10-CM | POA: Diagnosis not present

## 2022-08-28 DIAGNOSIS — E039 Hypothyroidism, unspecified: Secondary | ICD-10-CM | POA: Diagnosis not present

## 2022-08-28 DIAGNOSIS — G309 Alzheimer's disease, unspecified: Secondary | ICD-10-CM | POA: Diagnosis not present

## 2022-08-28 DIAGNOSIS — N3946 Mixed incontinence: Secondary | ICD-10-CM | POA: Diagnosis not present

## 2022-08-28 DIAGNOSIS — J189 Pneumonia, unspecified organism: Secondary | ICD-10-CM | POA: Diagnosis not present

## 2022-08-28 DIAGNOSIS — M1990 Unspecified osteoarthritis, unspecified site: Secondary | ICD-10-CM | POA: Diagnosis not present

## 2022-08-29 DIAGNOSIS — M1990 Unspecified osteoarthritis, unspecified site: Secondary | ICD-10-CM | POA: Diagnosis not present

## 2022-08-29 DIAGNOSIS — E039 Hypothyroidism, unspecified: Secondary | ICD-10-CM | POA: Diagnosis not present

## 2022-08-29 DIAGNOSIS — R531 Weakness: Secondary | ICD-10-CM | POA: Diagnosis not present

## 2022-08-29 DIAGNOSIS — F419 Anxiety disorder, unspecified: Secondary | ICD-10-CM | POA: Diagnosis not present

## 2022-08-29 DIAGNOSIS — J189 Pneumonia, unspecified organism: Secondary | ICD-10-CM | POA: Diagnosis not present

## 2022-08-29 DIAGNOSIS — R4182 Altered mental status, unspecified: Secondary | ICD-10-CM | POA: Diagnosis not present

## 2022-08-29 DIAGNOSIS — N3946 Mixed incontinence: Secondary | ICD-10-CM | POA: Diagnosis not present

## 2022-08-29 DIAGNOSIS — E785 Hyperlipidemia, unspecified: Secondary | ICD-10-CM | POA: Diagnosis not present

## 2022-08-29 DIAGNOSIS — G309 Alzheimer's disease, unspecified: Secondary | ICD-10-CM | POA: Diagnosis not present

## 2022-08-29 DIAGNOSIS — R0602 Shortness of breath: Secondary | ICD-10-CM | POA: Diagnosis not present

## 2022-08-29 DIAGNOSIS — K5900 Constipation, unspecified: Secondary | ICD-10-CM | POA: Diagnosis not present

## 2022-08-31 DIAGNOSIS — E785 Hyperlipidemia, unspecified: Secondary | ICD-10-CM | POA: Diagnosis not present

## 2022-08-31 DIAGNOSIS — N3946 Mixed incontinence: Secondary | ICD-10-CM | POA: Diagnosis not present

## 2022-08-31 DIAGNOSIS — E039 Hypothyroidism, unspecified: Secondary | ICD-10-CM | POA: Diagnosis not present

## 2022-08-31 DIAGNOSIS — G309 Alzheimer's disease, unspecified: Secondary | ICD-10-CM | POA: Diagnosis not present

## 2022-08-31 DIAGNOSIS — M1990 Unspecified osteoarthritis, unspecified site: Secondary | ICD-10-CM | POA: Diagnosis not present

## 2022-08-31 DIAGNOSIS — J189 Pneumonia, unspecified organism: Secondary | ICD-10-CM | POA: Diagnosis not present

## 2022-09-03 DIAGNOSIS — J189 Pneumonia, unspecified organism: Secondary | ICD-10-CM | POA: Diagnosis not present

## 2022-09-03 DIAGNOSIS — E785 Hyperlipidemia, unspecified: Secondary | ICD-10-CM | POA: Diagnosis not present

## 2022-09-03 DIAGNOSIS — E039 Hypothyroidism, unspecified: Secondary | ICD-10-CM | POA: Diagnosis not present

## 2022-09-03 DIAGNOSIS — G309 Alzheimer's disease, unspecified: Secondary | ICD-10-CM | POA: Diagnosis not present

## 2022-09-03 DIAGNOSIS — M1990 Unspecified osteoarthritis, unspecified site: Secondary | ICD-10-CM | POA: Diagnosis not present

## 2022-09-03 DIAGNOSIS — N3946 Mixed incontinence: Secondary | ICD-10-CM | POA: Diagnosis not present

## 2022-09-04 DIAGNOSIS — E039 Hypothyroidism, unspecified: Secondary | ICD-10-CM | POA: Diagnosis not present

## 2022-09-04 DIAGNOSIS — G309 Alzheimer's disease, unspecified: Secondary | ICD-10-CM | POA: Diagnosis not present

## 2022-09-04 DIAGNOSIS — E785 Hyperlipidemia, unspecified: Secondary | ICD-10-CM | POA: Diagnosis not present

## 2022-09-04 DIAGNOSIS — J189 Pneumonia, unspecified organism: Secondary | ICD-10-CM | POA: Diagnosis not present

## 2022-09-04 DIAGNOSIS — N3946 Mixed incontinence: Secondary | ICD-10-CM | POA: Diagnosis not present

## 2022-09-04 DIAGNOSIS — M1990 Unspecified osteoarthritis, unspecified site: Secondary | ICD-10-CM | POA: Diagnosis not present

## 2022-09-05 DIAGNOSIS — J189 Pneumonia, unspecified organism: Secondary | ICD-10-CM | POA: Diagnosis not present

## 2022-09-05 DIAGNOSIS — E039 Hypothyroidism, unspecified: Secondary | ICD-10-CM | POA: Diagnosis not present

## 2022-09-05 DIAGNOSIS — E785 Hyperlipidemia, unspecified: Secondary | ICD-10-CM | POA: Diagnosis not present

## 2022-09-05 DIAGNOSIS — M1990 Unspecified osteoarthritis, unspecified site: Secondary | ICD-10-CM | POA: Diagnosis not present

## 2022-09-05 DIAGNOSIS — N3946 Mixed incontinence: Secondary | ICD-10-CM | POA: Diagnosis not present

## 2022-09-05 DIAGNOSIS — G309 Alzheimer's disease, unspecified: Secondary | ICD-10-CM | POA: Diagnosis not present

## 2022-09-07 DIAGNOSIS — G309 Alzheimer's disease, unspecified: Secondary | ICD-10-CM | POA: Diagnosis not present

## 2022-09-07 DIAGNOSIS — E039 Hypothyroidism, unspecified: Secondary | ICD-10-CM | POA: Diagnosis not present

## 2022-09-07 DIAGNOSIS — J189 Pneumonia, unspecified organism: Secondary | ICD-10-CM | POA: Diagnosis not present

## 2022-09-07 DIAGNOSIS — M1990 Unspecified osteoarthritis, unspecified site: Secondary | ICD-10-CM | POA: Diagnosis not present

## 2022-09-07 DIAGNOSIS — E785 Hyperlipidemia, unspecified: Secondary | ICD-10-CM | POA: Diagnosis not present

## 2022-09-07 DIAGNOSIS — N3946 Mixed incontinence: Secondary | ICD-10-CM | POA: Diagnosis not present

## 2022-09-10 DIAGNOSIS — M1990 Unspecified osteoarthritis, unspecified site: Secondary | ICD-10-CM | POA: Diagnosis not present

## 2022-09-10 DIAGNOSIS — E785 Hyperlipidemia, unspecified: Secondary | ICD-10-CM | POA: Diagnosis not present

## 2022-09-10 DIAGNOSIS — J189 Pneumonia, unspecified organism: Secondary | ICD-10-CM | POA: Diagnosis not present

## 2022-09-10 DIAGNOSIS — N3946 Mixed incontinence: Secondary | ICD-10-CM | POA: Diagnosis not present

## 2022-09-10 DIAGNOSIS — E039 Hypothyroidism, unspecified: Secondary | ICD-10-CM | POA: Diagnosis not present

## 2022-09-10 DIAGNOSIS — G309 Alzheimer's disease, unspecified: Secondary | ICD-10-CM | POA: Diagnosis not present

## 2022-09-11 DIAGNOSIS — G309 Alzheimer's disease, unspecified: Secondary | ICD-10-CM | POA: Diagnosis not present

## 2022-09-11 DIAGNOSIS — N3946 Mixed incontinence: Secondary | ICD-10-CM | POA: Diagnosis not present

## 2022-09-11 DIAGNOSIS — E785 Hyperlipidemia, unspecified: Secondary | ICD-10-CM | POA: Diagnosis not present

## 2022-09-11 DIAGNOSIS — E039 Hypothyroidism, unspecified: Secondary | ICD-10-CM | POA: Diagnosis not present

## 2022-09-11 DIAGNOSIS — M1990 Unspecified osteoarthritis, unspecified site: Secondary | ICD-10-CM | POA: Diagnosis not present

## 2022-09-11 DIAGNOSIS — J189 Pneumonia, unspecified organism: Secondary | ICD-10-CM | POA: Diagnosis not present

## 2022-09-12 DIAGNOSIS — G309 Alzheimer's disease, unspecified: Secondary | ICD-10-CM | POA: Diagnosis not present

## 2022-09-12 DIAGNOSIS — M1990 Unspecified osteoarthritis, unspecified site: Secondary | ICD-10-CM | POA: Diagnosis not present

## 2022-09-12 DIAGNOSIS — J189 Pneumonia, unspecified organism: Secondary | ICD-10-CM | POA: Diagnosis not present

## 2022-09-12 DIAGNOSIS — N3946 Mixed incontinence: Secondary | ICD-10-CM | POA: Diagnosis not present

## 2022-09-12 DIAGNOSIS — E785 Hyperlipidemia, unspecified: Secondary | ICD-10-CM | POA: Diagnosis not present

## 2022-09-12 DIAGNOSIS — E039 Hypothyroidism, unspecified: Secondary | ICD-10-CM | POA: Diagnosis not present

## 2022-09-13 DIAGNOSIS — M1990 Unspecified osteoarthritis, unspecified site: Secondary | ICD-10-CM | POA: Diagnosis not present

## 2022-09-13 DIAGNOSIS — E039 Hypothyroidism, unspecified: Secondary | ICD-10-CM | POA: Diagnosis not present

## 2022-09-13 DIAGNOSIS — N3946 Mixed incontinence: Secondary | ICD-10-CM | POA: Diagnosis not present

## 2022-09-13 DIAGNOSIS — G309 Alzheimer's disease, unspecified: Secondary | ICD-10-CM | POA: Diagnosis not present

## 2022-09-13 DIAGNOSIS — E785 Hyperlipidemia, unspecified: Secondary | ICD-10-CM | POA: Diagnosis not present

## 2022-09-13 DIAGNOSIS — J189 Pneumonia, unspecified organism: Secondary | ICD-10-CM | POA: Diagnosis not present

## 2022-09-14 DIAGNOSIS — E785 Hyperlipidemia, unspecified: Secondary | ICD-10-CM | POA: Diagnosis not present

## 2022-09-14 DIAGNOSIS — G309 Alzheimer's disease, unspecified: Secondary | ICD-10-CM | POA: Diagnosis not present

## 2022-09-14 DIAGNOSIS — N3946 Mixed incontinence: Secondary | ICD-10-CM | POA: Diagnosis not present

## 2022-09-14 DIAGNOSIS — M1990 Unspecified osteoarthritis, unspecified site: Secondary | ICD-10-CM | POA: Diagnosis not present

## 2022-09-14 DIAGNOSIS — E039 Hypothyroidism, unspecified: Secondary | ICD-10-CM | POA: Diagnosis not present

## 2022-09-14 DIAGNOSIS — J189 Pneumonia, unspecified organism: Secondary | ICD-10-CM | POA: Diagnosis not present

## 2022-09-17 DIAGNOSIS — E039 Hypothyroidism, unspecified: Secondary | ICD-10-CM | POA: Diagnosis not present

## 2022-09-17 DIAGNOSIS — N3946 Mixed incontinence: Secondary | ICD-10-CM | POA: Diagnosis not present

## 2022-09-17 DIAGNOSIS — M1990 Unspecified osteoarthritis, unspecified site: Secondary | ICD-10-CM | POA: Diagnosis not present

## 2022-09-17 DIAGNOSIS — J189 Pneumonia, unspecified organism: Secondary | ICD-10-CM | POA: Diagnosis not present

## 2022-09-17 DIAGNOSIS — E785 Hyperlipidemia, unspecified: Secondary | ICD-10-CM | POA: Diagnosis not present

## 2022-09-17 DIAGNOSIS — G309 Alzheimer's disease, unspecified: Secondary | ICD-10-CM | POA: Diagnosis not present

## 2022-09-18 DIAGNOSIS — E039 Hypothyroidism, unspecified: Secondary | ICD-10-CM | POA: Diagnosis not present

## 2022-09-18 DIAGNOSIS — M1990 Unspecified osteoarthritis, unspecified site: Secondary | ICD-10-CM | POA: Diagnosis not present

## 2022-09-18 DIAGNOSIS — N3946 Mixed incontinence: Secondary | ICD-10-CM | POA: Diagnosis not present

## 2022-09-18 DIAGNOSIS — G309 Alzheimer's disease, unspecified: Secondary | ICD-10-CM | POA: Diagnosis not present

## 2022-09-18 DIAGNOSIS — J189 Pneumonia, unspecified organism: Secondary | ICD-10-CM | POA: Diagnosis not present

## 2022-09-18 DIAGNOSIS — E785 Hyperlipidemia, unspecified: Secondary | ICD-10-CM | POA: Diagnosis not present

## 2022-09-19 DIAGNOSIS — J189 Pneumonia, unspecified organism: Secondary | ICD-10-CM | POA: Diagnosis not present

## 2022-09-19 DIAGNOSIS — M1990 Unspecified osteoarthritis, unspecified site: Secondary | ICD-10-CM | POA: Diagnosis not present

## 2022-09-19 DIAGNOSIS — E039 Hypothyroidism, unspecified: Secondary | ICD-10-CM | POA: Diagnosis not present

## 2022-09-19 DIAGNOSIS — E785 Hyperlipidemia, unspecified: Secondary | ICD-10-CM | POA: Diagnosis not present

## 2022-09-19 DIAGNOSIS — N3946 Mixed incontinence: Secondary | ICD-10-CM | POA: Diagnosis not present

## 2022-09-19 DIAGNOSIS — G309 Alzheimer's disease, unspecified: Secondary | ICD-10-CM | POA: Diagnosis not present

## 2022-09-21 DIAGNOSIS — G309 Alzheimer's disease, unspecified: Secondary | ICD-10-CM | POA: Diagnosis not present

## 2022-09-21 DIAGNOSIS — E785 Hyperlipidemia, unspecified: Secondary | ICD-10-CM | POA: Diagnosis not present

## 2022-09-21 DIAGNOSIS — M1990 Unspecified osteoarthritis, unspecified site: Secondary | ICD-10-CM | POA: Diagnosis not present

## 2022-09-21 DIAGNOSIS — E039 Hypothyroidism, unspecified: Secondary | ICD-10-CM | POA: Diagnosis not present

## 2022-09-21 DIAGNOSIS — J189 Pneumonia, unspecified organism: Secondary | ICD-10-CM | POA: Diagnosis not present

## 2022-09-21 DIAGNOSIS — N3946 Mixed incontinence: Secondary | ICD-10-CM | POA: Diagnosis not present

## 2022-09-24 DIAGNOSIS — N3946 Mixed incontinence: Secondary | ICD-10-CM | POA: Diagnosis not present

## 2022-09-24 DIAGNOSIS — E785 Hyperlipidemia, unspecified: Secondary | ICD-10-CM | POA: Diagnosis not present

## 2022-09-24 DIAGNOSIS — G309 Alzheimer's disease, unspecified: Secondary | ICD-10-CM | POA: Diagnosis not present

## 2022-09-24 DIAGNOSIS — M1990 Unspecified osteoarthritis, unspecified site: Secondary | ICD-10-CM | POA: Diagnosis not present

## 2022-09-24 DIAGNOSIS — E039 Hypothyroidism, unspecified: Secondary | ICD-10-CM | POA: Diagnosis not present

## 2022-09-24 DIAGNOSIS — J189 Pneumonia, unspecified organism: Secondary | ICD-10-CM | POA: Diagnosis not present

## 2022-09-26 DIAGNOSIS — E785 Hyperlipidemia, unspecified: Secondary | ICD-10-CM | POA: Diagnosis not present

## 2022-09-26 DIAGNOSIS — N3946 Mixed incontinence: Secondary | ICD-10-CM | POA: Diagnosis not present

## 2022-09-26 DIAGNOSIS — M1990 Unspecified osteoarthritis, unspecified site: Secondary | ICD-10-CM | POA: Diagnosis not present

## 2022-09-26 DIAGNOSIS — J189 Pneumonia, unspecified organism: Secondary | ICD-10-CM | POA: Diagnosis not present

## 2022-09-26 DIAGNOSIS — G309 Alzheimer's disease, unspecified: Secondary | ICD-10-CM | POA: Diagnosis not present

## 2022-09-26 DIAGNOSIS — E039 Hypothyroidism, unspecified: Secondary | ICD-10-CM | POA: Diagnosis not present

## 2022-09-27 DIAGNOSIS — E785 Hyperlipidemia, unspecified: Secondary | ICD-10-CM | POA: Diagnosis not present

## 2022-09-27 DIAGNOSIS — J189 Pneumonia, unspecified organism: Secondary | ICD-10-CM | POA: Diagnosis not present

## 2022-09-27 DIAGNOSIS — N3946 Mixed incontinence: Secondary | ICD-10-CM | POA: Diagnosis not present

## 2022-09-27 DIAGNOSIS — E039 Hypothyroidism, unspecified: Secondary | ICD-10-CM | POA: Diagnosis not present

## 2022-09-27 DIAGNOSIS — M1990 Unspecified osteoarthritis, unspecified site: Secondary | ICD-10-CM | POA: Diagnosis not present

## 2022-09-27 DIAGNOSIS — G309 Alzheimer's disease, unspecified: Secondary | ICD-10-CM | POA: Diagnosis not present

## 2022-09-28 DIAGNOSIS — N3946 Mixed incontinence: Secondary | ICD-10-CM | POA: Diagnosis not present

## 2022-09-28 DIAGNOSIS — R531 Weakness: Secondary | ICD-10-CM | POA: Diagnosis not present

## 2022-09-28 DIAGNOSIS — G309 Alzheimer's disease, unspecified: Secondary | ICD-10-CM | POA: Diagnosis not present

## 2022-09-28 DIAGNOSIS — J189 Pneumonia, unspecified organism: Secondary | ICD-10-CM | POA: Diagnosis not present

## 2022-09-28 DIAGNOSIS — R0602 Shortness of breath: Secondary | ICD-10-CM | POA: Diagnosis not present

## 2022-09-28 DIAGNOSIS — E039 Hypothyroidism, unspecified: Secondary | ICD-10-CM | POA: Diagnosis not present

## 2022-09-28 DIAGNOSIS — M1990 Unspecified osteoarthritis, unspecified site: Secondary | ICD-10-CM | POA: Diagnosis not present

## 2022-09-28 DIAGNOSIS — F419 Anxiety disorder, unspecified: Secondary | ICD-10-CM | POA: Diagnosis not present

## 2022-09-28 DIAGNOSIS — K5900 Constipation, unspecified: Secondary | ICD-10-CM | POA: Diagnosis not present

## 2022-09-28 DIAGNOSIS — R4182 Altered mental status, unspecified: Secondary | ICD-10-CM | POA: Diagnosis not present

## 2022-09-28 DIAGNOSIS — E785 Hyperlipidemia, unspecified: Secondary | ICD-10-CM | POA: Diagnosis not present

## 2022-10-01 DIAGNOSIS — E785 Hyperlipidemia, unspecified: Secondary | ICD-10-CM | POA: Diagnosis not present

## 2022-10-01 DIAGNOSIS — J189 Pneumonia, unspecified organism: Secondary | ICD-10-CM | POA: Diagnosis not present

## 2022-10-01 DIAGNOSIS — G309 Alzheimer's disease, unspecified: Secondary | ICD-10-CM | POA: Diagnosis not present

## 2022-10-01 DIAGNOSIS — N3946 Mixed incontinence: Secondary | ICD-10-CM | POA: Diagnosis not present

## 2022-10-01 DIAGNOSIS — M1990 Unspecified osteoarthritis, unspecified site: Secondary | ICD-10-CM | POA: Diagnosis not present

## 2022-10-01 DIAGNOSIS — E039 Hypothyroidism, unspecified: Secondary | ICD-10-CM | POA: Diagnosis not present

## 2022-10-02 DIAGNOSIS — N3946 Mixed incontinence: Secondary | ICD-10-CM | POA: Diagnosis not present

## 2022-10-02 DIAGNOSIS — E785 Hyperlipidemia, unspecified: Secondary | ICD-10-CM | POA: Diagnosis not present

## 2022-10-02 DIAGNOSIS — J189 Pneumonia, unspecified organism: Secondary | ICD-10-CM | POA: Diagnosis not present

## 2022-10-02 DIAGNOSIS — M1990 Unspecified osteoarthritis, unspecified site: Secondary | ICD-10-CM | POA: Diagnosis not present

## 2022-10-02 DIAGNOSIS — E039 Hypothyroidism, unspecified: Secondary | ICD-10-CM | POA: Diagnosis not present

## 2022-10-02 DIAGNOSIS — G309 Alzheimer's disease, unspecified: Secondary | ICD-10-CM | POA: Diagnosis not present

## 2022-10-03 DIAGNOSIS — E039 Hypothyroidism, unspecified: Secondary | ICD-10-CM | POA: Diagnosis not present

## 2022-10-03 DIAGNOSIS — J189 Pneumonia, unspecified organism: Secondary | ICD-10-CM | POA: Diagnosis not present

## 2022-10-03 DIAGNOSIS — M1990 Unspecified osteoarthritis, unspecified site: Secondary | ICD-10-CM | POA: Diagnosis not present

## 2022-10-03 DIAGNOSIS — N3946 Mixed incontinence: Secondary | ICD-10-CM | POA: Diagnosis not present

## 2022-10-03 DIAGNOSIS — E785 Hyperlipidemia, unspecified: Secondary | ICD-10-CM | POA: Diagnosis not present

## 2022-10-03 DIAGNOSIS — G309 Alzheimer's disease, unspecified: Secondary | ICD-10-CM | POA: Diagnosis not present

## 2022-10-04 DIAGNOSIS — G309 Alzheimer's disease, unspecified: Secondary | ICD-10-CM | POA: Diagnosis not present

## 2022-10-04 DIAGNOSIS — N3946 Mixed incontinence: Secondary | ICD-10-CM | POA: Diagnosis not present

## 2022-10-04 DIAGNOSIS — E785 Hyperlipidemia, unspecified: Secondary | ICD-10-CM | POA: Diagnosis not present

## 2022-10-04 DIAGNOSIS — M1990 Unspecified osteoarthritis, unspecified site: Secondary | ICD-10-CM | POA: Diagnosis not present

## 2022-10-04 DIAGNOSIS — J189 Pneumonia, unspecified organism: Secondary | ICD-10-CM | POA: Diagnosis not present

## 2022-10-04 DIAGNOSIS — E039 Hypothyroidism, unspecified: Secondary | ICD-10-CM | POA: Diagnosis not present

## 2022-10-05 DIAGNOSIS — E039 Hypothyroidism, unspecified: Secondary | ICD-10-CM | POA: Diagnosis not present

## 2022-10-05 DIAGNOSIS — N3946 Mixed incontinence: Secondary | ICD-10-CM | POA: Diagnosis not present

## 2022-10-05 DIAGNOSIS — G309 Alzheimer's disease, unspecified: Secondary | ICD-10-CM | POA: Diagnosis not present

## 2022-10-05 DIAGNOSIS — J189 Pneumonia, unspecified organism: Secondary | ICD-10-CM | POA: Diagnosis not present

## 2022-10-05 DIAGNOSIS — M1990 Unspecified osteoarthritis, unspecified site: Secondary | ICD-10-CM | POA: Diagnosis not present

## 2022-10-05 DIAGNOSIS — E785 Hyperlipidemia, unspecified: Secondary | ICD-10-CM | POA: Diagnosis not present

## 2022-10-08 DIAGNOSIS — E039 Hypothyroidism, unspecified: Secondary | ICD-10-CM | POA: Diagnosis not present

## 2022-10-08 DIAGNOSIS — G309 Alzheimer's disease, unspecified: Secondary | ICD-10-CM | POA: Diagnosis not present

## 2022-10-08 DIAGNOSIS — M1990 Unspecified osteoarthritis, unspecified site: Secondary | ICD-10-CM | POA: Diagnosis not present

## 2022-10-08 DIAGNOSIS — E785 Hyperlipidemia, unspecified: Secondary | ICD-10-CM | POA: Diagnosis not present

## 2022-10-08 DIAGNOSIS — J189 Pneumonia, unspecified organism: Secondary | ICD-10-CM | POA: Diagnosis not present

## 2022-10-08 DIAGNOSIS — N3946 Mixed incontinence: Secondary | ICD-10-CM | POA: Diagnosis not present

## 2022-10-09 DIAGNOSIS — E785 Hyperlipidemia, unspecified: Secondary | ICD-10-CM | POA: Diagnosis not present

## 2022-10-09 DIAGNOSIS — J189 Pneumonia, unspecified organism: Secondary | ICD-10-CM | POA: Diagnosis not present

## 2022-10-09 DIAGNOSIS — G309 Alzheimer's disease, unspecified: Secondary | ICD-10-CM | POA: Diagnosis not present

## 2022-10-09 DIAGNOSIS — M1990 Unspecified osteoarthritis, unspecified site: Secondary | ICD-10-CM | POA: Diagnosis not present

## 2022-10-09 DIAGNOSIS — E039 Hypothyroidism, unspecified: Secondary | ICD-10-CM | POA: Diagnosis not present

## 2022-10-09 DIAGNOSIS — N3946 Mixed incontinence: Secondary | ICD-10-CM | POA: Diagnosis not present

## 2022-10-10 DIAGNOSIS — J189 Pneumonia, unspecified organism: Secondary | ICD-10-CM | POA: Diagnosis not present

## 2022-10-10 DIAGNOSIS — E039 Hypothyroidism, unspecified: Secondary | ICD-10-CM | POA: Diagnosis not present

## 2022-10-10 DIAGNOSIS — E785 Hyperlipidemia, unspecified: Secondary | ICD-10-CM | POA: Diagnosis not present

## 2022-10-10 DIAGNOSIS — N3946 Mixed incontinence: Secondary | ICD-10-CM | POA: Diagnosis not present

## 2022-10-10 DIAGNOSIS — M1990 Unspecified osteoarthritis, unspecified site: Secondary | ICD-10-CM | POA: Diagnosis not present

## 2022-10-10 DIAGNOSIS — G309 Alzheimer's disease, unspecified: Secondary | ICD-10-CM | POA: Diagnosis not present

## 2022-10-12 DIAGNOSIS — J189 Pneumonia, unspecified organism: Secondary | ICD-10-CM | POA: Diagnosis not present

## 2022-10-12 DIAGNOSIS — M1990 Unspecified osteoarthritis, unspecified site: Secondary | ICD-10-CM | POA: Diagnosis not present

## 2022-10-12 DIAGNOSIS — N3946 Mixed incontinence: Secondary | ICD-10-CM | POA: Diagnosis not present

## 2022-10-12 DIAGNOSIS — G309 Alzheimer's disease, unspecified: Secondary | ICD-10-CM | POA: Diagnosis not present

## 2022-10-12 DIAGNOSIS — E785 Hyperlipidemia, unspecified: Secondary | ICD-10-CM | POA: Diagnosis not present

## 2022-10-12 DIAGNOSIS — E039 Hypothyroidism, unspecified: Secondary | ICD-10-CM | POA: Diagnosis not present

## 2022-10-15 DIAGNOSIS — E785 Hyperlipidemia, unspecified: Secondary | ICD-10-CM | POA: Diagnosis not present

## 2022-10-15 DIAGNOSIS — E039 Hypothyroidism, unspecified: Secondary | ICD-10-CM | POA: Diagnosis not present

## 2022-10-15 DIAGNOSIS — M1990 Unspecified osteoarthritis, unspecified site: Secondary | ICD-10-CM | POA: Diagnosis not present

## 2022-10-15 DIAGNOSIS — N3946 Mixed incontinence: Secondary | ICD-10-CM | POA: Diagnosis not present

## 2022-10-15 DIAGNOSIS — J189 Pneumonia, unspecified organism: Secondary | ICD-10-CM | POA: Diagnosis not present

## 2022-10-15 DIAGNOSIS — G309 Alzheimer's disease, unspecified: Secondary | ICD-10-CM | POA: Diagnosis not present

## 2022-10-16 DIAGNOSIS — N3946 Mixed incontinence: Secondary | ICD-10-CM | POA: Diagnosis not present

## 2022-10-16 DIAGNOSIS — J189 Pneumonia, unspecified organism: Secondary | ICD-10-CM | POA: Diagnosis not present

## 2022-10-16 DIAGNOSIS — G309 Alzheimer's disease, unspecified: Secondary | ICD-10-CM | POA: Diagnosis not present

## 2022-10-16 DIAGNOSIS — E785 Hyperlipidemia, unspecified: Secondary | ICD-10-CM | POA: Diagnosis not present

## 2022-10-16 DIAGNOSIS — M1990 Unspecified osteoarthritis, unspecified site: Secondary | ICD-10-CM | POA: Diagnosis not present

## 2022-10-16 DIAGNOSIS — E039 Hypothyroidism, unspecified: Secondary | ICD-10-CM | POA: Diagnosis not present

## 2022-10-17 DIAGNOSIS — E039 Hypothyroidism, unspecified: Secondary | ICD-10-CM | POA: Diagnosis not present

## 2022-10-17 DIAGNOSIS — E785 Hyperlipidemia, unspecified: Secondary | ICD-10-CM | POA: Diagnosis not present

## 2022-10-17 DIAGNOSIS — J189 Pneumonia, unspecified organism: Secondary | ICD-10-CM | POA: Diagnosis not present

## 2022-10-17 DIAGNOSIS — G309 Alzheimer's disease, unspecified: Secondary | ICD-10-CM | POA: Diagnosis not present

## 2022-10-17 DIAGNOSIS — N3946 Mixed incontinence: Secondary | ICD-10-CM | POA: Diagnosis not present

## 2022-10-17 DIAGNOSIS — M1990 Unspecified osteoarthritis, unspecified site: Secondary | ICD-10-CM | POA: Diagnosis not present

## 2022-10-19 DIAGNOSIS — N3946 Mixed incontinence: Secondary | ICD-10-CM | POA: Diagnosis not present

## 2022-10-19 DIAGNOSIS — G309 Alzheimer's disease, unspecified: Secondary | ICD-10-CM | POA: Diagnosis not present

## 2022-10-19 DIAGNOSIS — E785 Hyperlipidemia, unspecified: Secondary | ICD-10-CM | POA: Diagnosis not present

## 2022-10-19 DIAGNOSIS — J189 Pneumonia, unspecified organism: Secondary | ICD-10-CM | POA: Diagnosis not present

## 2022-10-19 DIAGNOSIS — M1990 Unspecified osteoarthritis, unspecified site: Secondary | ICD-10-CM | POA: Diagnosis not present

## 2022-10-19 DIAGNOSIS — E039 Hypothyroidism, unspecified: Secondary | ICD-10-CM | POA: Diagnosis not present

## 2022-10-23 DIAGNOSIS — N3946 Mixed incontinence: Secondary | ICD-10-CM | POA: Diagnosis not present

## 2022-10-23 DIAGNOSIS — G309 Alzheimer's disease, unspecified: Secondary | ICD-10-CM | POA: Diagnosis not present

## 2022-10-23 DIAGNOSIS — M1990 Unspecified osteoarthritis, unspecified site: Secondary | ICD-10-CM | POA: Diagnosis not present

## 2022-10-23 DIAGNOSIS — E785 Hyperlipidemia, unspecified: Secondary | ICD-10-CM | POA: Diagnosis not present

## 2022-10-23 DIAGNOSIS — J189 Pneumonia, unspecified organism: Secondary | ICD-10-CM | POA: Diagnosis not present

## 2022-10-23 DIAGNOSIS — E039 Hypothyroidism, unspecified: Secondary | ICD-10-CM | POA: Diagnosis not present

## 2022-10-24 DIAGNOSIS — E039 Hypothyroidism, unspecified: Secondary | ICD-10-CM | POA: Diagnosis not present

## 2022-10-24 DIAGNOSIS — E785 Hyperlipidemia, unspecified: Secondary | ICD-10-CM | POA: Diagnosis not present

## 2022-10-24 DIAGNOSIS — G309 Alzheimer's disease, unspecified: Secondary | ICD-10-CM | POA: Diagnosis not present

## 2022-10-24 DIAGNOSIS — M1990 Unspecified osteoarthritis, unspecified site: Secondary | ICD-10-CM | POA: Diagnosis not present

## 2022-10-24 DIAGNOSIS — J189 Pneumonia, unspecified organism: Secondary | ICD-10-CM | POA: Diagnosis not present

## 2022-10-24 DIAGNOSIS — N3946 Mixed incontinence: Secondary | ICD-10-CM | POA: Diagnosis not present

## 2022-10-25 DIAGNOSIS — N3946 Mixed incontinence: Secondary | ICD-10-CM | POA: Diagnosis not present

## 2022-10-25 DIAGNOSIS — M1990 Unspecified osteoarthritis, unspecified site: Secondary | ICD-10-CM | POA: Diagnosis not present

## 2022-10-25 DIAGNOSIS — J189 Pneumonia, unspecified organism: Secondary | ICD-10-CM | POA: Diagnosis not present

## 2022-10-25 DIAGNOSIS — E785 Hyperlipidemia, unspecified: Secondary | ICD-10-CM | POA: Diagnosis not present

## 2022-10-25 DIAGNOSIS — E039 Hypothyroidism, unspecified: Secondary | ICD-10-CM | POA: Diagnosis not present

## 2022-10-25 DIAGNOSIS — G309 Alzheimer's disease, unspecified: Secondary | ICD-10-CM | POA: Diagnosis not present

## 2024-05-29 DEATH — deceased
